# Patient Record
Sex: Female | Born: 1965
Health system: Southern US, Community
[De-identification: ages and names within clinical notes are randomized; demographics above are authoritative.]

## PROBLEM LIST (undated history)

## (undated) DIAGNOSIS — Z9889 Other specified postprocedural states: Secondary | ICD-10-CM

## (undated) DIAGNOSIS — M5136 Other intervertebral disc degeneration, lumbar region: Secondary | ICD-10-CM

## (undated) DIAGNOSIS — F419 Anxiety disorder, unspecified: Secondary | ICD-10-CM

## (undated) DIAGNOSIS — R112 Nausea with vomiting, unspecified: Secondary | ICD-10-CM

## (undated) DIAGNOSIS — Z8673 Personal history of transient ischemic attack (TIA), and cerebral infarction without residual deficits: Secondary | ICD-10-CM

## (undated) DIAGNOSIS — I639 Cerebral infarction, unspecified: Secondary | ICD-10-CM

## (undated) DIAGNOSIS — I499 Cardiac arrhythmia, unspecified: Secondary | ICD-10-CM

## (undated) DIAGNOSIS — E039 Hypothyroidism, unspecified: Secondary | ICD-10-CM

## (undated) DIAGNOSIS — E559 Vitamin D deficiency, unspecified: Secondary | ICD-10-CM

## (undated) DIAGNOSIS — M170 Bilateral primary osteoarthritis of knee: Secondary | ICD-10-CM

## (undated) DIAGNOSIS — M19072 Primary osteoarthritis, left ankle and foot: Secondary | ICD-10-CM

## (undated) DIAGNOSIS — M19071 Primary osteoarthritis, right ankle and foot: Secondary | ICD-10-CM

## (undated) DIAGNOSIS — Z8489 Family history of other specified conditions: Secondary | ICD-10-CM

## (undated) DIAGNOSIS — T505X5A Adverse effect of appetite depressants, initial encounter: Secondary | ICD-10-CM

## (undated) HISTORY — DX: Personal history of transient ischemic attack (TIA), and cerebral infarction without residual deficits: Z86.73

## (undated) HISTORY — PX: ABDOMINAL HYSTERECTOMY: SHX81

## (undated) HISTORY — PX: LASIK: SHX215

## (undated) HISTORY — DX: Primary osteoarthritis, right ankle and foot: M19.071

## (undated) HISTORY — PX: KNEE ARTHROSCOPY: SUR90

## (undated) HISTORY — DX: Other intervertebral disc degeneration, lumbar region: M51.36

## (undated) HISTORY — PX: CHOLECYSTECTOMY: SHX55

## (undated) HISTORY — DX: Bilateral primary osteoarthritis of knee: M17.0

## (undated) HISTORY — DX: Primary osteoarthritis, left ankle and foot: M19.072

## (undated) HISTORY — PX: EYE SURGERY: SHX253

---
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
## 2003-01-05 ENCOUNTER — Other Ambulatory Visit: Admission: RE | Admit: 2003-01-05 | Discharge: 2003-01-05 | Payer: Self-pay | Admitting: Obstetrics and Gynecology

## 2003-01-12 ENCOUNTER — Encounter: Payer: Self-pay | Admitting: Obstetrics and Gynecology

## 2003-01-12 ENCOUNTER — Ambulatory Visit (HOSPITAL_COMMUNITY): Admission: RE | Admit: 2003-01-12 | Discharge: 2003-01-12 | Payer: Self-pay | Admitting: Obstetrics and Gynecology

## 2003-01-19 ENCOUNTER — Encounter: Admission: RE | Admit: 2003-01-19 | Discharge: 2003-01-19 | Payer: Self-pay | Admitting: Obstetrics and Gynecology

## 2003-06-03 ENCOUNTER — Encounter: Admission: RE | Admit: 2003-06-03 | Discharge: 2003-06-03 | Payer: Self-pay | Admitting: Obstetrics and Gynecology

## 2003-12-31 ENCOUNTER — Other Ambulatory Visit: Admission: RE | Admit: 2003-12-31 | Discharge: 2003-12-31 | Payer: Self-pay | Admitting: Obstetrics and Gynecology

## 2004-11-16 ENCOUNTER — Other Ambulatory Visit: Admission: RE | Admit: 2004-11-16 | Discharge: 2004-11-16 | Payer: Self-pay | Admitting: Obstetrics and Gynecology

## 2004-12-13 ENCOUNTER — Encounter: Admission: RE | Admit: 2004-12-13 | Discharge: 2004-12-13 | Payer: Self-pay | Admitting: Obstetrics and Gynecology

## 2004-12-16 ENCOUNTER — Encounter: Admission: RE | Admit: 2004-12-16 | Discharge: 2004-12-16 | Payer: Self-pay | Admitting: Obstetrics and Gynecology

## 2005-03-23 ENCOUNTER — Encounter: Admission: RE | Admit: 2005-03-23 | Discharge: 2005-03-23 | Payer: Self-pay | Admitting: Obstetrics and Gynecology

## 2005-12-19 ENCOUNTER — Other Ambulatory Visit: Admission: RE | Admit: 2005-12-19 | Discharge: 2005-12-19 | Payer: Self-pay | Admitting: Obstetrics and Gynecology

## 2005-12-25 ENCOUNTER — Encounter: Admission: RE | Admit: 2005-12-25 | Discharge: 2005-12-25 | Payer: Self-pay | Admitting: Obstetrics and Gynecology

## 2006-12-27 ENCOUNTER — Encounter: Admission: RE | Admit: 2006-12-27 | Discharge: 2006-12-27 | Payer: Self-pay | Admitting: Obstetrics and Gynecology

## 2007-09-25 ENCOUNTER — Encounter: Admission: RE | Admit: 2007-09-25 | Discharge: 2007-09-25 | Payer: Self-pay | Admitting: Obstetrics and Gynecology

## 2007-12-30 ENCOUNTER — Encounter: Admission: RE | Admit: 2007-12-30 | Discharge: 2007-12-30 | Payer: Self-pay | Admitting: Obstetrics and Gynecology

## 2008-01-07 ENCOUNTER — Encounter: Admission: RE | Admit: 2008-01-07 | Discharge: 2008-01-07 | Payer: Self-pay | Admitting: Obstetrics and Gynecology

## 2008-01-09 ENCOUNTER — Encounter: Admission: RE | Admit: 2008-01-09 | Discharge: 2008-01-09 | Payer: Self-pay | Admitting: Obstetrics and Gynecology

## 2008-01-09 ENCOUNTER — Encounter (INDEPENDENT_AMBULATORY_CARE_PROVIDER_SITE_OTHER): Payer: Self-pay | Admitting: Diagnostic Radiology

## 2008-12-30 ENCOUNTER — Encounter: Admission: RE | Admit: 2008-12-30 | Discharge: 2008-12-30 | Payer: Self-pay | Admitting: Obstetrics and Gynecology

## 2009-01-07 ENCOUNTER — Encounter: Admission: RE | Admit: 2009-01-07 | Discharge: 2009-01-07 | Payer: Self-pay | Admitting: Obstetrics and Gynecology

## 2009-08-29 IMAGING — MG MM DIAGNOSTIC LTD RIGHT
3 series · 3 of 3 positions shown · non-contrast
Comparison: Multiple prior mammograms, dating back to December 13, 2004

CLINICAL DATA: Possible mass right breast seen on recent screening
mammogram of 12/30/2007

DIGITAL DIAGNOSTIC  RIGHT  MAMMOGRAM  WITHOUT CAD AND RIGHT BREAST
ULTRASOUND:

[R MLO]
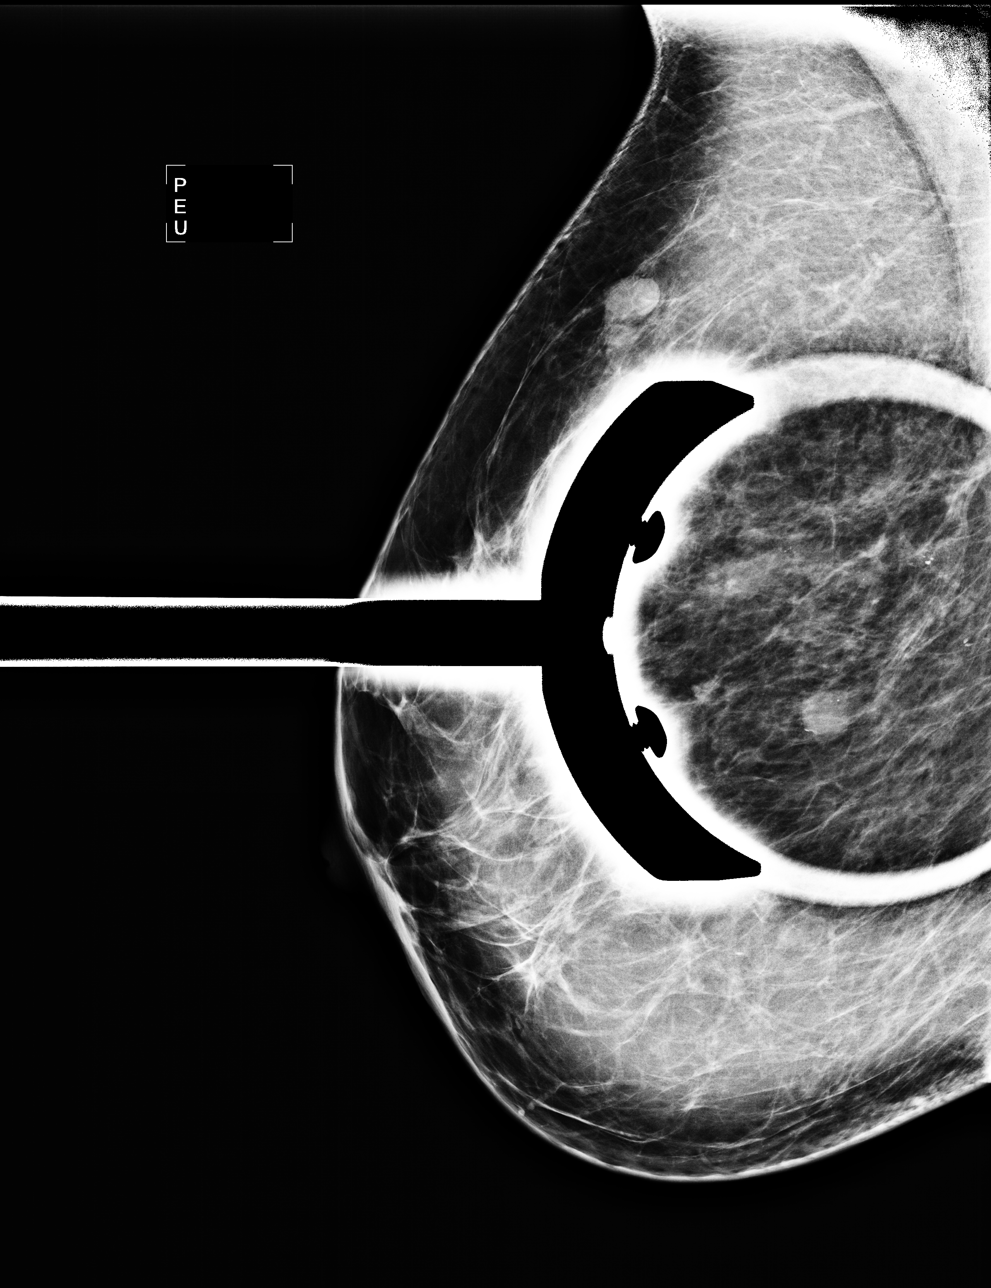

[R TAN]
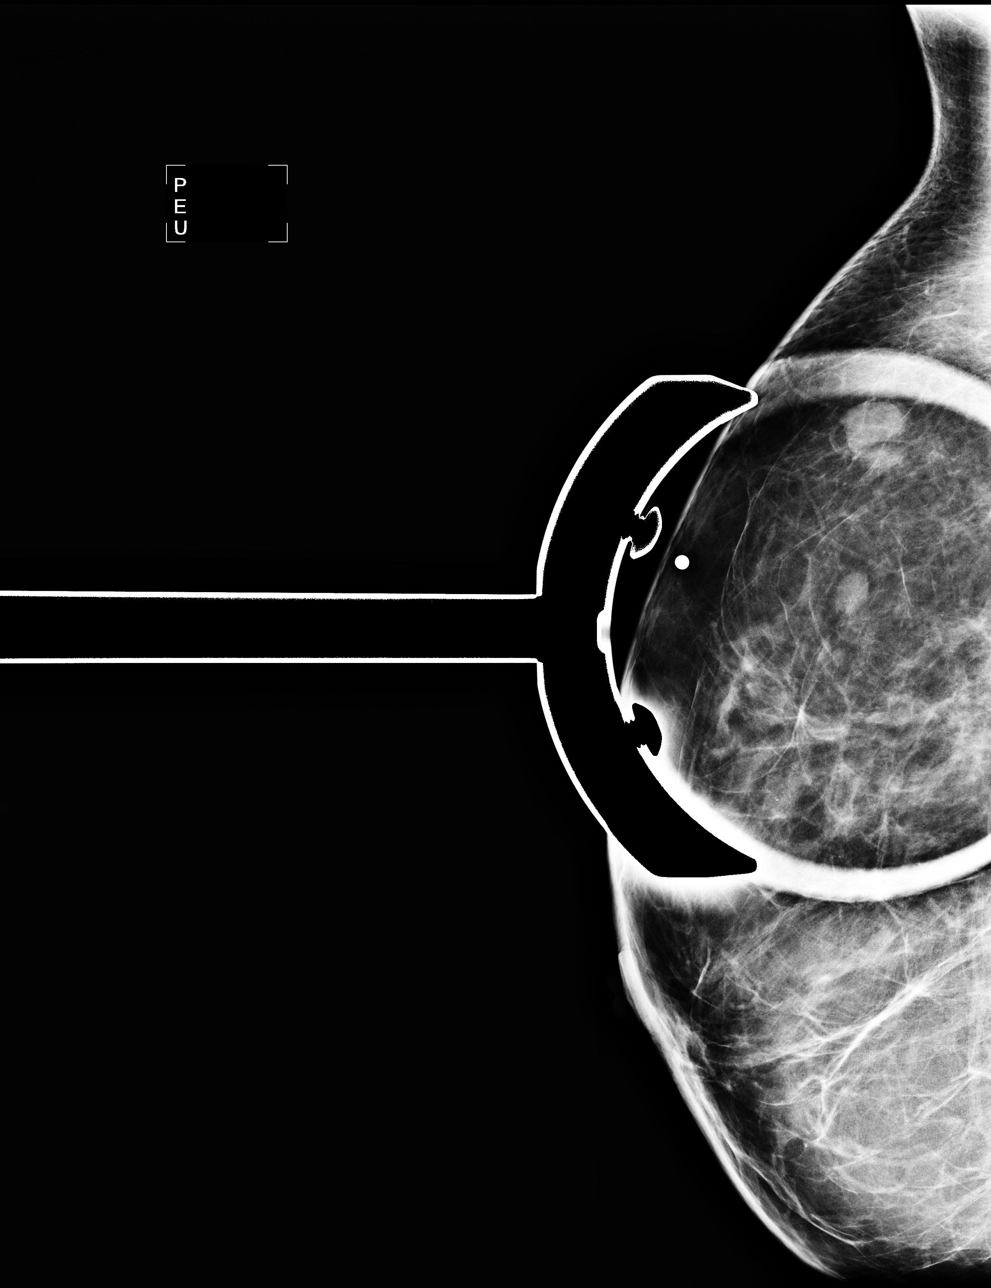

[R ML]
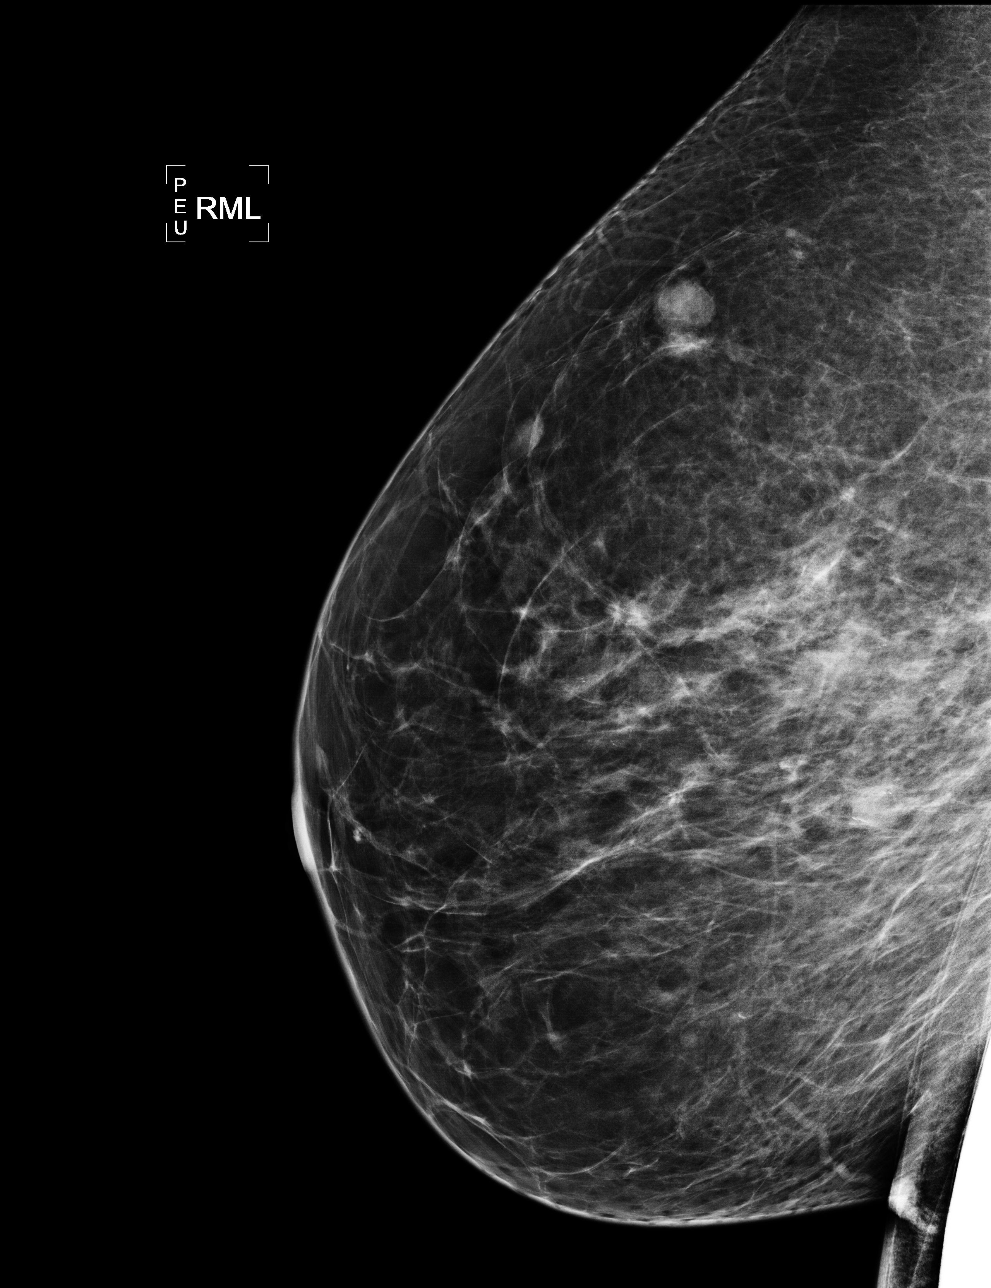

[3 of 3 positions shown; findings below may reference images not displayed]

FINDINGS: Spot compression view of the right breast, near the level
the nipple on the MLO view show an 8 mm circumscribed nodule.  This
may correspond to a nodule in the lateral right breast on the
recent screening mammogram.  A 90 degrees lateral view of the right
breast shows an 11 mm nodule in the far upper right breast that
appears stable dating back to 05/15/2004.

On physical exam, no mass is palpated in the upper outer right
breast.

Ultrasound is performed, showing a circumscribed 7 mm cyst with
internal echoes at 9 o'clock position 7 cm from the nipple.  At 9
o'clock position 5 cm from the nipple is an 8 mm simple cyst.

Ultrasound of the 12 o'clock right breast 5 to 6 cm from the nipple
shows a hypoechoic lobulated mass with some internal echogenic
bands that measures 1.1 by 1.1 x 0.6 cm.  At [DATE] position 3 cm
from the nipple is an oval simple cyst measuring 6 mm.

A spot tangential view of the right breast with a metallic marker
placed on the skin at the site of the solid nodule is performed.
Just superior to the metallic marker is a circumscribed 11 mm
nodule that is thought to be the mammographic correlate of the
solid nodule identified sonographically, and is stable dating back
to December 13, 2004.
IMPRESSION: 1.  Probable benign fibroadenoma 12 o'clock position right breast 5
cm from the nipple.  It is thought that the sonographically
identified solid nodule  at 12 o'clock position corresponds to a
mammographically identified nodule which is stable dating back to
6556, supporting a benign etiology.  To ensure stability of the
solid the lesion, a 6-month follow-up ultrasound is suggested.  The
option of ultrasound-guided core needle biopsy is also discussed
with the patient.  She would feel more comfortable having a biopsy.
Ultrasound-guided core needle biopsy has been scheduled for January 09, 2008 1 o'clock p.m.
2.  A total of three cysts are identified in the right breast.  One
of the cysts at the 9 o'clock region of the right breast is thought
to account for the mammographically identified nodule.

BI-RADS CATEGORY 3:  Probably benign finding(s) - short interval
follow-up suggested.

## 2009-08-31 IMAGING — US UNKNOWN PR STUDY
1 series · 8 of 8 positions shown · non-contrast
Comparison: none

Addendum Begins

Pathology reveals a fibroadenoma. This was found to be concordant
by Dr. Yoshitaro Alvizo. I spoke with the patient by telephone to relay
her pathology results. She stated that she had slight tenderness at
the biopsy site, but otherwise had done well after the biopsy. Post
biopsy instructions were given. The patient was told to call The
She was asked to return in 1 year for screening mammography.
Naty Dutra RN, BSN on January 13, 2008.
Addendum Ends
CLINICAL DATA: 1.1 cm hypoechoic solid mass 12 to 1 o'clock
position of the right breast with features suggestive of a probable
fibroadenoma.

[Series 2: unknown pr study · 8 of 8 slices shown]
[im 1/8]
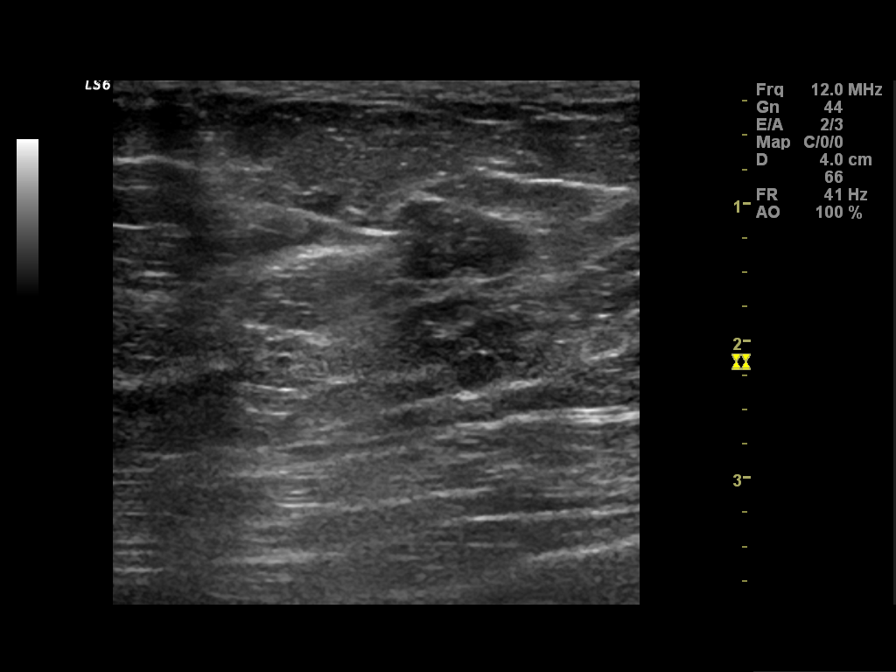
[im 2/8]
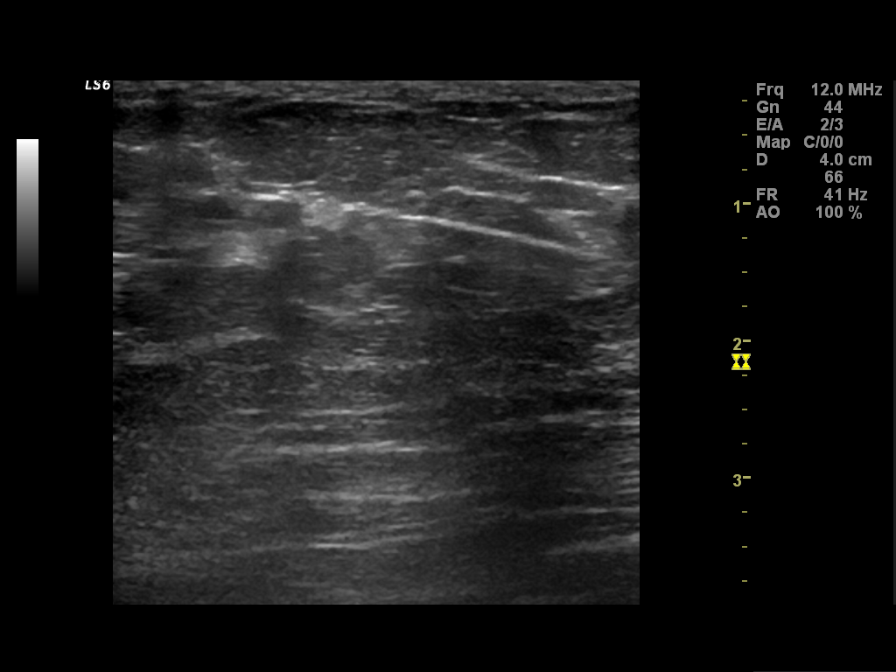
[im 3/8]
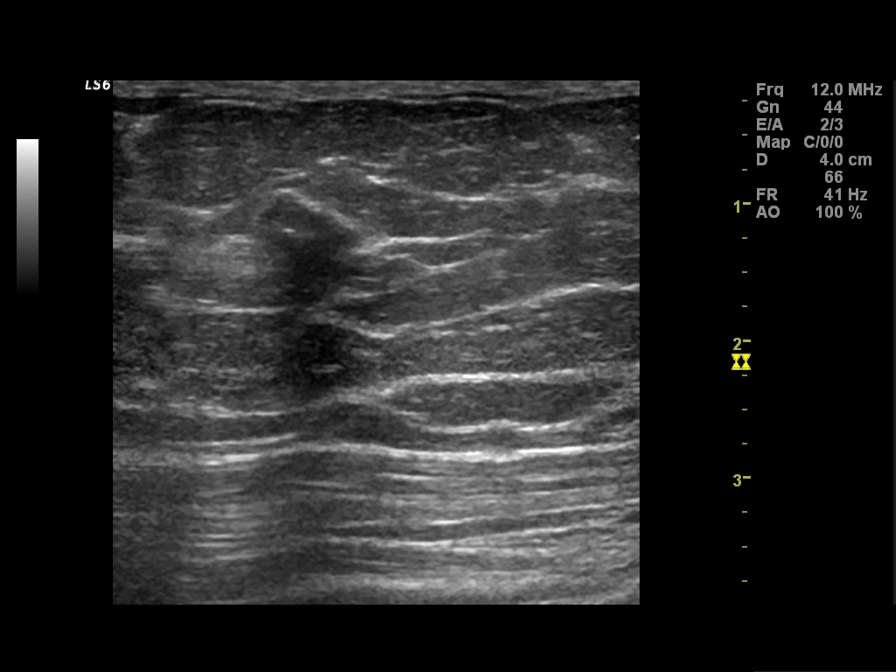
[im 4/8]
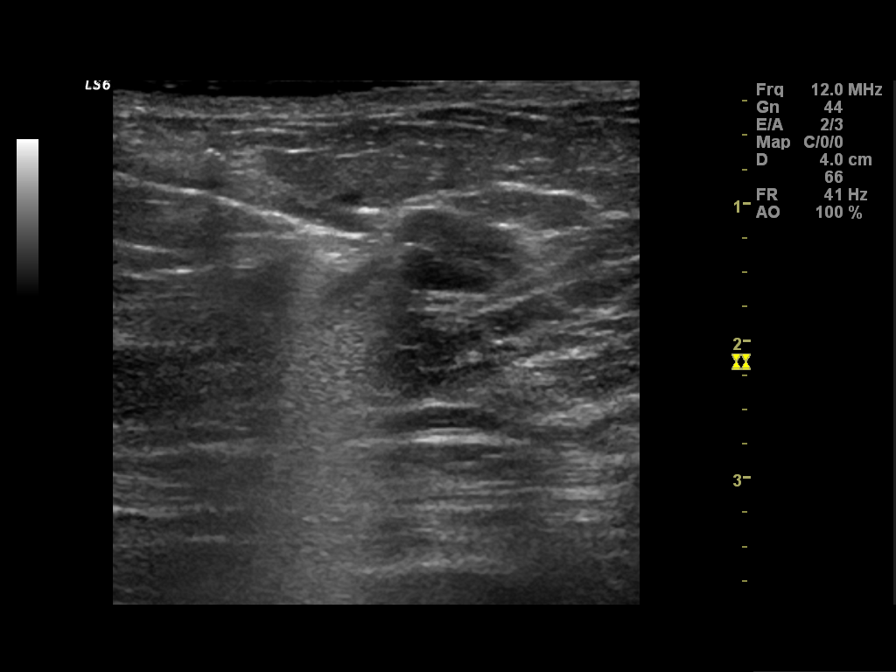
[im 5/8]
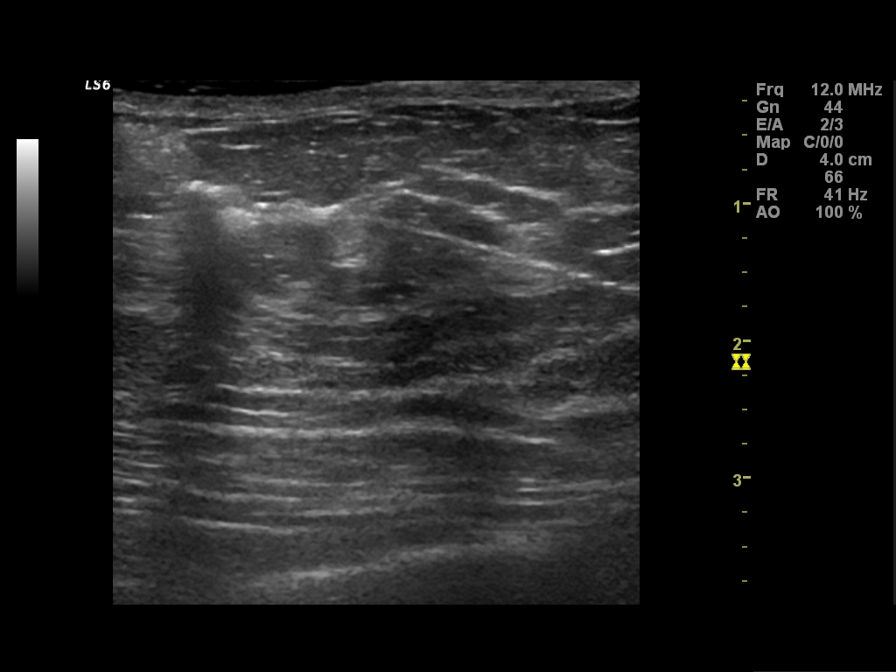
[im 6/8]
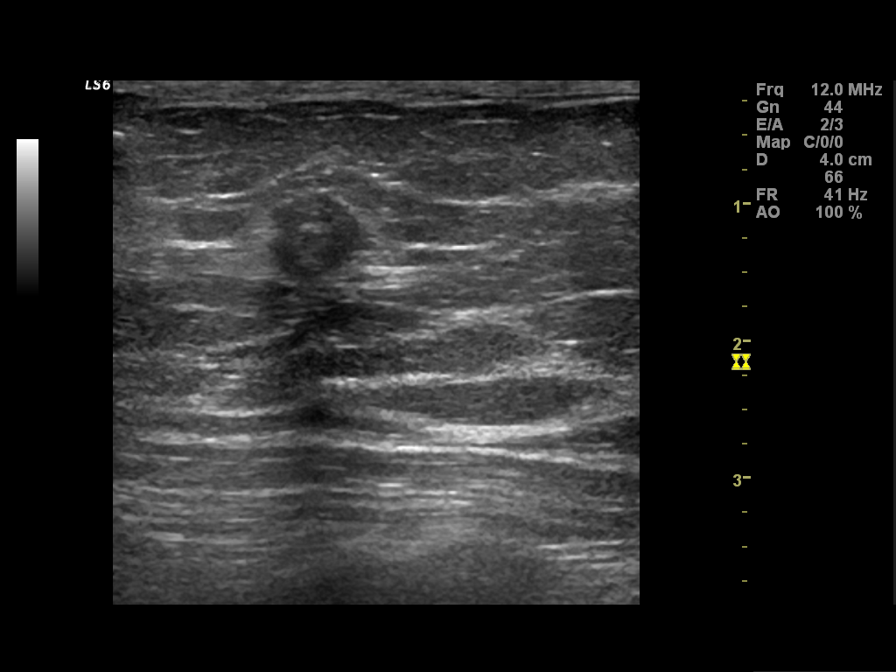
[im 7/8]
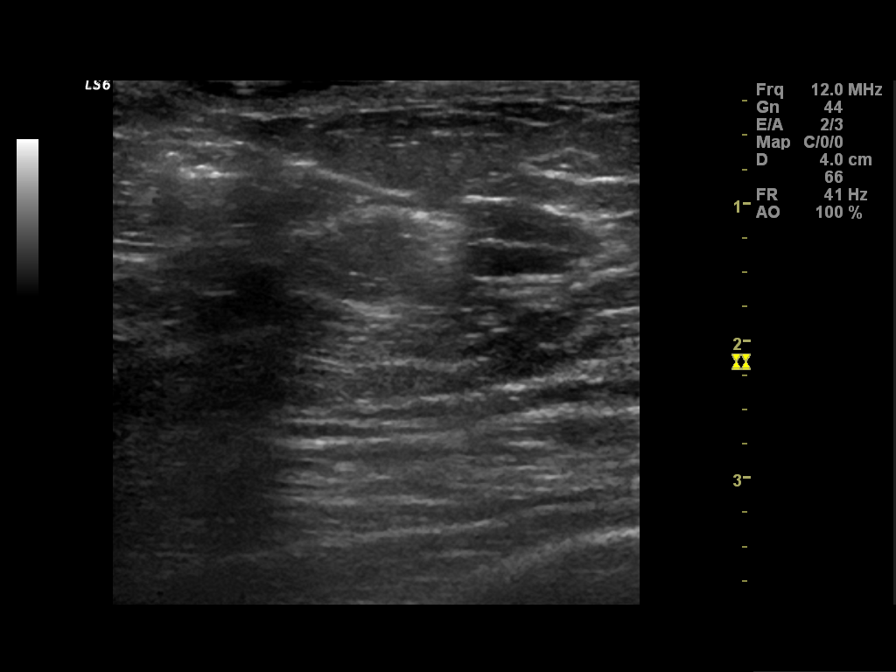
[im 8/8]
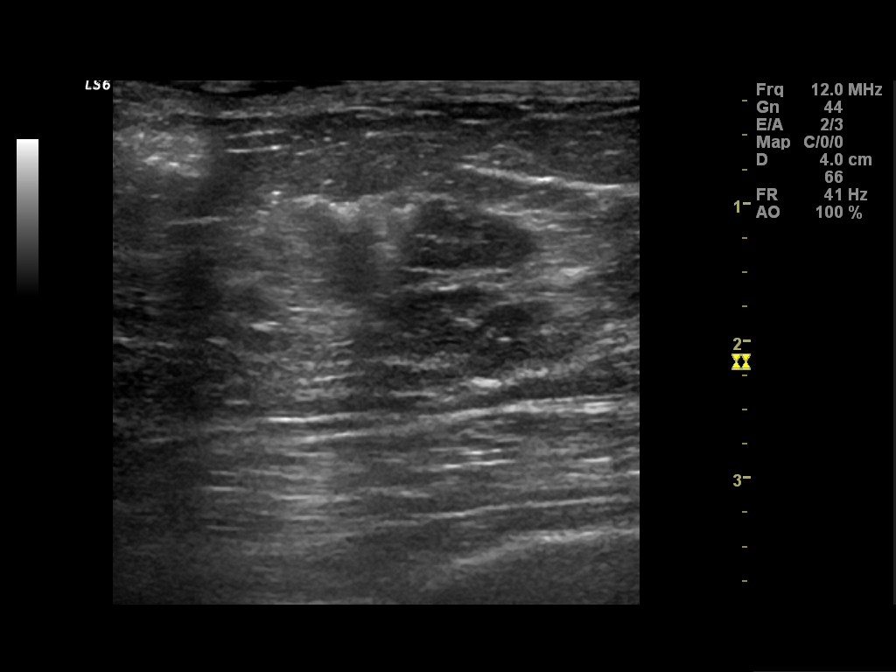

[8 of 8 positions shown; findings below may reference images not displayed]

ULTRASOUND GUIDED CORE BIOPSY OF THE RIGHT BREAST

I met with the patient, and we discussed the procedure of
ultrasound-guided biopsy, including risks, benefits, and
alternatives.  Specifically, we discussed the risks of infection,
bleeding, tissue injury, clip migration, and inadequate sampling.
Informed, written consent was given.

Using sterile technique,  2% lidocaine, ultrasound guidance, and a
14 gauge automated biopsy device, biopsy was performed of a 1.1 cm
solid nodule with features of a probable fibroadenoma.  At the
conclusion of the procedure, a tissue marker clip was deployed into
the biopsy cavity.  Follow-up 2-view mammogram confirmed clip to be
adequate, along the anterolateral margin of the nodule.
IMPRESSION: Ultrasound-guided biopsy of a 1.1 cm right breast mass.  No
apparent complications.

## 2010-01-10 ENCOUNTER — Encounter: Admission: RE | Admit: 2010-01-10 | Discharge: 2010-01-10 | Payer: Self-pay | Admitting: Obstetrics and Gynecology

## 2010-07-19 DIAGNOSIS — I639 Cerebral infarction, unspecified: Secondary | ICD-10-CM

## 2010-07-19 HISTORY — DX: Cerebral infarction, unspecified: I63.9

## 2010-08-03 ENCOUNTER — Emergency Department (HOSPITAL_COMMUNITY)
Admission: EM | Admit: 2010-08-03 | Discharge: 2010-08-03 | Disposition: A | Discharge status: Home / Self Care | Payer: 59 | Attending: Emergency Medicine | Admitting: Emergency Medicine

## 2010-08-03 ENCOUNTER — Emergency Department (HOSPITAL_COMMUNITY): Payer: 59

## 2010-08-03 DIAGNOSIS — F411 Generalized anxiety disorder: Secondary | ICD-10-CM | POA: Insufficient documentation

## 2010-08-03 DIAGNOSIS — R209 Unspecified disturbances of skin sensation: Secondary | ICD-10-CM | POA: Insufficient documentation

## 2010-08-03 DIAGNOSIS — M1990 Unspecified osteoarthritis, unspecified site: Secondary | ICD-10-CM | POA: Insufficient documentation

## 2010-08-03 LAB — COMPREHENSIVE METABOLIC PANEL
Albumin: 3 g/dL — ABNORMAL LOW (ref 3.5–5.2)
BUN: 8 mg/dL (ref 6–23)
CO2: 23 mEq/L (ref 19–32)
Chloride: 106 mEq/L (ref 96–112)
Creatinine, Ser: 0.57 mg/dL (ref 0.4–1.2)
GFR calc non Af Amer: >60 mL/min (ref >60)
Glucose, Bld: 102 mg/dL — ABNORMAL HIGH (ref 70–99)
Total Bilirubin: 0.8 mg/dL (ref 0.3–1.2)

## 2010-08-03 LAB — URINALYSIS, ROUTINE W REFLEX MICROSCOPIC
Bilirubin Urine: NEGATIVE
Glucose, UA: NEGATIVE mg/dL
Hgb urine dipstick: NEGATIVE
Ketones, ur: NEGATIVE mg/dL
pH: 6.5 (ref 5.0–8.0)

## 2010-08-03 LAB — RAPID URINE DRUG SCREEN, HOSP PERFORMED
Amphetamines: NOT DETECTED
Benzodiazepines: NOT DETECTED
Cocaine: NOT DETECTED
Opiates: NOT DETECTED
Tetrahydrocannabinol: NOT DETECTED

## 2010-08-03 LAB — CBC
MCH: 30 pg (ref 26.0–34.0)
MCV: 89 fL (ref 78.0–100.0)
Platelets: 253 10*3/uL (ref 150–400)
RBC: 4.2 MIL/uL (ref 3.87–5.11)

## 2010-08-03 LAB — RPR: RPR Ser Ql: NONREACTIVE

## 2010-08-03 LAB — ETHANOL: Alcohol, Ethyl (B): <11 mg/dL — ABNORMAL HIGH (ref 0–10)

## 2010-08-03 LAB — PROTIME-INR: Prothrombin Time: 13.5 seconds (ref 11.6–15.2)

## 2010-08-03 MED ORDER — GADOBENATE DIMEGLUMINE 529 MG/ML IV SOLN
17.0000 mL | Freq: Once | INTRAVENOUS | Status: AC
  Administered 2010-08-03: 17 mL via INTRAVENOUS

## 2010-08-04 LAB — ANA: Anti Nuclear Antibody(ANA): NEGATIVE

## 2010-08-04 LAB — C-REACTIVE PROTEIN: CRP: 1.7 mg/dL — ABNORMAL HIGH (ref <0.6)

## 2010-08-04 NOTE — Consult Note (Signed)
[name], [name]                ACCOUNT NO.:  [id number]  MEDICAL RECORD NO.:  [id number]           PATIENT TYPE:  E  LOCATION:  [facility]                         FACILITY:  [facility]  PHYSICIAN:  Levie Heritage, MD       DATE OF BIRTH:  1965/08/15  DATE OF CONSULTATION:  08/03/2010 DATE OF DISCHARGE:                                CONSULTATION   REFERRING PHYSICIAN:  Emergency Department Team in [facility]U.  REASON FOR CONSULTATION:  Left face and tongue paresthesias.  HISTORY OF PRESENT ILLNESS:  This patient is a 45 year old woman with no significant past medical history who woke up this morning and gradually started notice feeling "weird feeling" in her left face, left side of the tongue and questionable heaviness feeling in her left arm.  She was brought to the emergency department and a CT scan and an MRI of the brain was performed, which did not show any acute intracranial abnormality.  On detailed questioning to the patient, she admits to have recent stressors going on in her life mainly her son leaving for the [employer] today and also she has some old family member to take care of at home which is adding to her stressors.  PAST MEDICAL HISTORY:  Osteoarthritis.  The patient has been tested by rheumatologist for Schirmer test for the possible Sjogren in the past and has been negative.  She does have red eyes once in a while due to nonspecific inflammation as per Ophthalmology.  ALLERGIES:  No known drug allergies.  MEDICATIONS:  She takes Omega-3 oil, Ambien once or twice a week, and glucosamine.  FAMILY HISTORY:  No family history of vasculopathy or strokes in early ages.  SOCIAL HISTORY:  Lives with her family at home.  No smoking, no alcohol, or illicit drug abuse.  REVIEW OF SYSTEMS:  She does have off and on left eye redness because of nonspecific inflammation as per the Ophthalmology evaluation in the past.  She denies any chest pain.  Denies any shortness of  breath. Denies any recent weight loss or weight gain.  Denies any rashes. Denies any fever or any weakness of the limbs.  Denies any sensory issues any more since it resolved.  Denies any nausea, vomiting, or diarrhea.  Denies any burning urination.  Ten-organ review of system unremarkable except those mentioned above.  It is important to mention that the patient has been anxious these days because of her son leaving for the [employer] and old family member moving in with her and she has to take care of her as well now.  REVIEW OF CLINICAL DATA:  I have reviewed the patient's lab workup here as well as brain imaging and found unremarkable MRI of the brain and CAT scan of the head as well.  PHYSICAL EXAMINATION:  VITAL SIGNS:  Blood pressure 141/70 mmHg, pulse 59 per minute, breathing 18 per minute. CARDIOVASCULAR:  S1 plus S2 plus zero. ABDOMEN:  Soft, nontender without any organomegaly. LUNGS:  Breathing clear to auscultation bilaterally at least interiorly. NEUROLOGIC:  Bilateral reactive pupils to light and accommodation. Moves eyes to all direction.  Visual  fields are intact in all the quadrants.  Extraocular muscle intact in all the gazes.  Symmetrical face for sensation and strength testing with midline tongue without atrophy or fasciculation and good strength testing of the tongue on both sides.  Midline symmetrical palate elevation and shoulder shrug intact strength.  Motor examination is 5/5 all over with 2+ deep tendon reflexes symmetrically all over.  The gait is casual and normal. Sensory examination, she admits to have recovered her numbness that started this morning, but states that she still feels altered exactly in the midline of her face with altered sensations being on the left side of the full face.  There is no other sensory abnormalities either.  IMPRESSION:  This is a 45 year old woman with very nonspecific left face numbness, feeling heaviness on the left arm  and questionable altered sensation of the left tongue as well, which for the most part has resolved back to baseline with an MRI of the brain that is negative as well.   My impression is that her symptoms are very nonspecific for any given CNS pathology.  However, there are multiple possibilities, which can cause such symptoms.  The workup will be directed towards the wide variety of possibilities; however, I do not think we need to do the lumbar puncture at this point for any possibility of demyelination since there is no clinical findings suggestive of that.  PLAN:  I have advised the ER Team to send her following labs.  HbA1c, ESR, CRP, ANA antibodies, B12 and folate with RPR.  Clearly for the prophylactic reason, we will start the patient on 81 mg of aspirin on daily basis, although her symptoms were not localizable to any specific vascular territory either.  I have discussed the patient in detail about my impression and she will follow up with the [facility] in 3-4 weeks for the above lab results.  Otherwise, there is no additional neurological intervention recommended at this time as inpatient.          ______________________________ Levie Heritage, MD   WS/MEDQ  D:  08/03/2010  T:  08/04/2010  Job:  [id number]  Electronically Signed by Levie Heritage MD on 08/04/2010 10:04:11 AM

## 2010-08-30 IMAGING — US US BREAST L
1 series · 3 of 3 positions shown · non-contrast
Comparison: January 09, 2008 and March 23, 2005

CLINICAL DATA: Abnormal screening mammogram

DIGITAL DIAGNOSTIC  LEFT  MAMMOGRAM   AND LEFT BREAST ULTRASOUND:

[Series 1: us breast left · 3 of 3 slices shown]
[im 1/3]
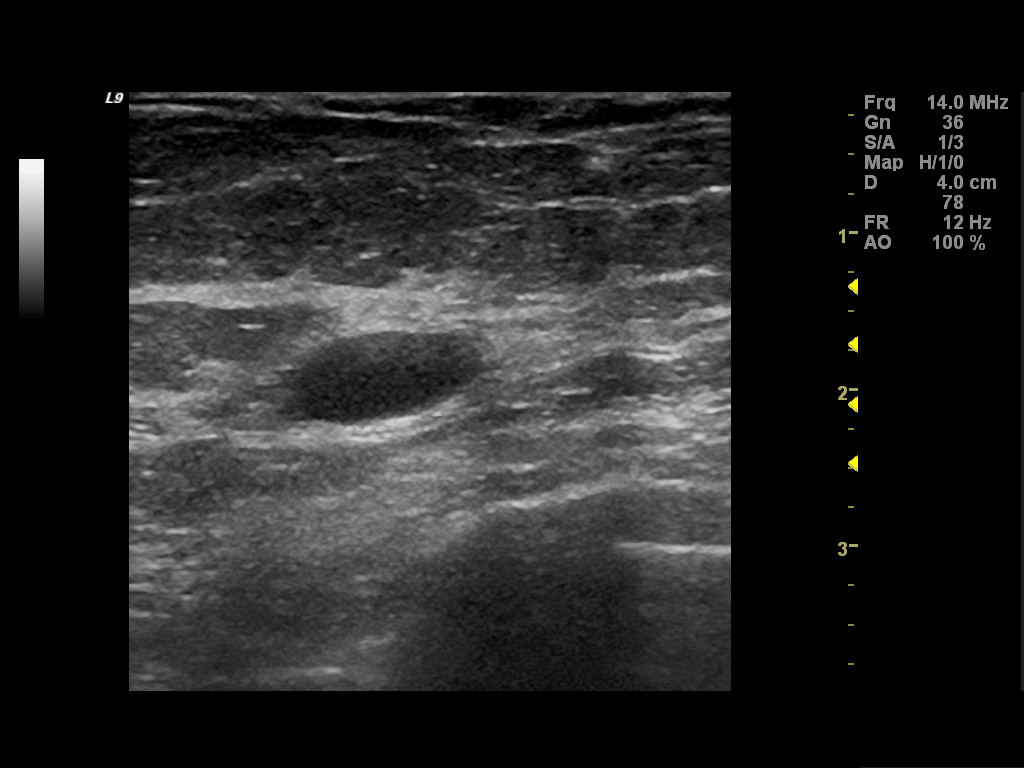
[im 2/3]
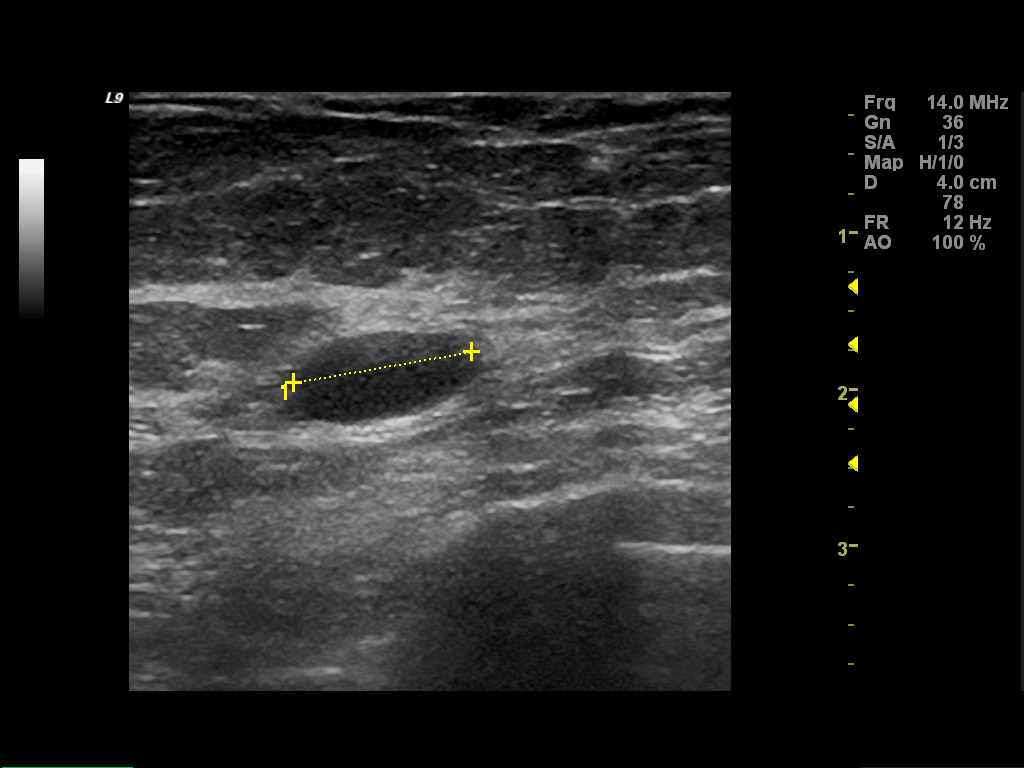
[im 3/3]
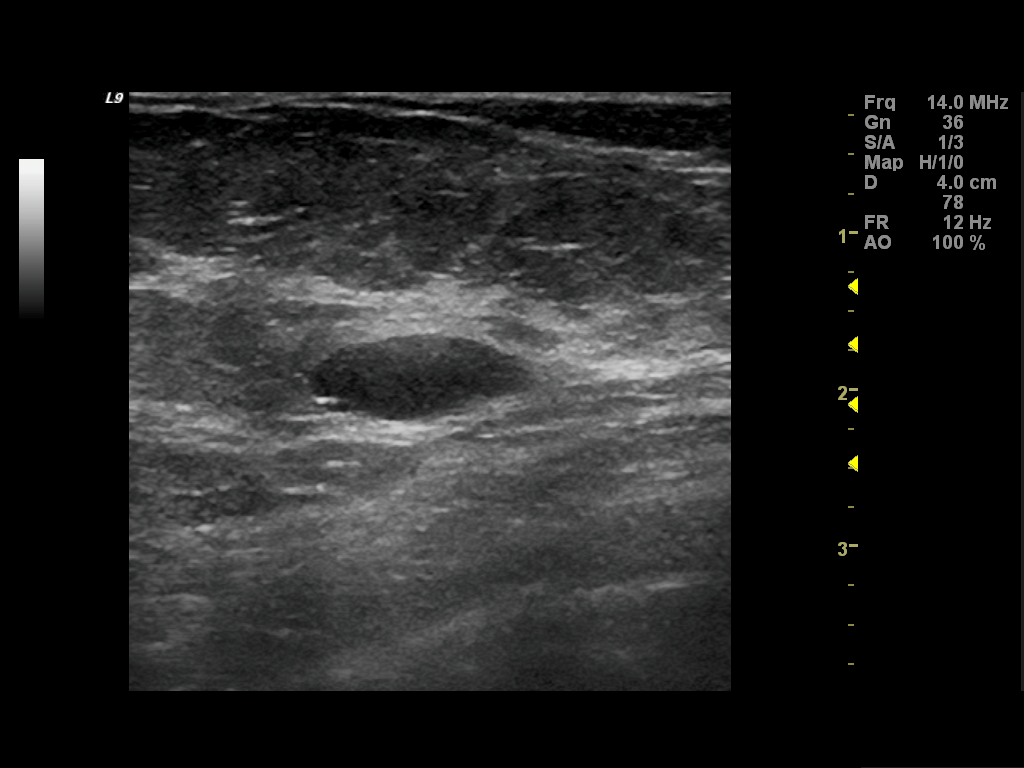

[3 of 3 positions shown; findings below may reference images not displayed]

FINDINGS: Additional views reveal persistence of multiple partially
obscured isodense masses in the left upper outer quadrant.  The
superior-most mass is new compared with the prior studies.  The
others have been proven to represent cysts with ultrasound in the
past.

On physical exam, I cannot palpate a discrete lump within the left
upper outer quadrant today.

Ultrasound is performed, showing multiple simple cysts at the 2
o'clock position of the left breast.  No suspicious solid masses or
shadowing are identified.
IMPRESSION: Simple cysts in the left upper outer quadrant.  There is no
radiographic evidence of malignancy on the left.  Screening
mammogram in 1 year recommended.

BI-RADS CATEGORY 2:  Benign finding(s).

## 2010-11-18 ENCOUNTER — Other Ambulatory Visit: Payer: Self-pay | Admitting: Obstetrics and Gynecology

## 2010-11-18 DIAGNOSIS — Z1231 Encounter for screening mammogram for malignant neoplasm of breast: Secondary | ICD-10-CM

## 2011-01-12 ENCOUNTER — Ambulatory Visit
Admission: RE | Admit: 2011-01-12 | Discharge: 2011-01-12 | Disposition: A | Discharge status: Home / Self Care | Payer: 59 | Source: Ambulatory Visit | Attending: Obstetrics and Gynecology | Admitting: Obstetrics and Gynecology

## 2011-01-12 DIAGNOSIS — Z1231 Encounter for screening mammogram for malignant neoplasm of breast: Secondary | ICD-10-CM

## 2011-03-21 HISTORY — PX: ABDOMINAL HYSTERECTOMY: SHX81

## 2011-09-20 NOTE — H&P (Signed)
[name]  DICTATION # [id number] CSN# [id number]   Meriel Pica, MD 09/20/2011 10:34 AM

## 2011-09-22 NOTE — H&P (Signed)
[name], [name]                ACCOUNT NO.:  [id number]  MEDICAL RECORD NO.:  [id number]  LOCATION:                                 FACILITY:  PHYSICIAN:  Duke Salvia. Marcelle Overlie, M.D.DATE OF BIRTH:  1965-08-03  DATE OF ADMISSION:  10/11/2011 DATE OF DISCHARGE:                             HISTORY & PHYSICAL   CHIEF COMPLAINT:  Pelvic pain, leiomyoma.  HISTORY OF PRESENT ILLNESS:  A 46 year old, G1, P1, who until recently had a Mirena IUD for contraception, one prior vaginal delivery.  The patient has been followed by Julio Sicks recently, history of prior mini-stroke and was told to discontinue her Levlite oral contraceptives after that episode made in 2012 when she was using the NuvaRing.  She has done well with the IUD, but continued to develop problems related to worsening pelvic pain.  Recent evaluation in our office; ultrasound on August 22, 2011, showed calcified intramural fibroid 3.5 cm with a small left ovarian cyst 2.6 x 1.7.  When seen by our PA in the office on April 2013, CA-125 was drawn, that was 12.4.  She presents now for definitive LAVH, possible USO or BSO, if pathology is noted.  This procedure including specific risks related to bleeding, infection, transfusion, the possible need for open surgery, and other complication related to wound infection, phlebitis along with her expected recovery time discussed, which she understands and accepts.  The IUD was removed 2 weeks preoperatively.  PAST MEDICAL HISTORY:  ALLERGIES:  CELEBREX.  PRIOR SURGERY:  One vaginal delivery, cholecystectomy.  CURRENT MEDICATIONS:  Vitamin supplements only, Motrin p.r.n.  REVIEW OF SYSTEMS:  Significant for history of migraine headache, arthritis, gallbladder disease, and prior history of mini-stroke without sequelae.  FAMILY HISTORY:  Significant for migraine, arthritis, diabetes, and breast cancer.  SOCIAL HISTORY:  Denies tobacco or drug use.  She does have 1-2 alcoholic  drinks per week.  She is married.  Dr. Anna Genre of [facility] is her medical doctor.  Last Pap on 03/2011 was normal.  PHYSICAL EXAMINATION:  VITAL SIGNS:  Temp 98.2, blood pressure 120/78. HEENT:  Unremarkable. NECK:  Supple without masses. LUNGS:  Clear. CARDIOVASCULAR:  Regular rate and rhythm without murmurs, rubs, or gallops. BREASTS:  Without masses. ABDOMEN:  Soft, flat, nontender. PELVIC:  Vulva, vagina, cervix normal.  Uterus was normal size with fibroid irregularity to the right posterior.  Adnexal negative. EXTREMITIES:  Unremarkable. NEUROLOGIC:  Unremarkable.  IMPRESSION:  Symptomatic calcified leiomyoma, pelvic pain.  PLAN:  LAVH, possible BSO.  Procedure and risks discussed as above.     [name] M. Marcelle Overlie, M.D.     [facility]/MEDQ  D:  09/20/2011  T:  09/20/2011  Job:  [id number]

## 2011-10-02 ENCOUNTER — Encounter (HOSPITAL_COMMUNITY): Payer: Self-pay | Admitting: Pharmacist

## 2011-10-04 ENCOUNTER — Encounter (HOSPITAL_COMMUNITY): Payer: Self-pay

## 2011-10-04 ENCOUNTER — Encounter (HOSPITAL_COMMUNITY)
Admission: RE | Admit: 2011-10-04 | Discharge: 2011-10-04 | Disposition: A | Discharge status: Home / Self Care | Payer: 59 | Source: Ambulatory Visit | Attending: Obstetrics and Gynecology | Admitting: Obstetrics and Gynecology

## 2011-10-04 HISTORY — DX: Cerebral infarction, unspecified: I63.9

## 2011-10-04 LAB — SURGICAL PCR SCREEN
MRSA, PCR: INVALID — AB
Staphylococcus aureus: INVALID — AB

## 2011-10-04 LAB — CBC
MCH: 30.1 pg (ref 26.0–34.0)
Platelets: 277 10*3/uL (ref 150–400)
RBC: 4.29 MIL/uL (ref 3.87–5.11)
WBC: 5.8 10*3/uL (ref 4.0–10.5)

## 2011-10-04 NOTE — Patient Instructions (Addendum)
20 PAYTYN MESTA  10/04/2011   Your procedure is scheduled on:  10/11/11  Enter through the Main Entrance of [facility] at 6 AM.  Pick up the phone at the desk and dial [phone].   Call this number if you have problems the morning of surgery: [phone]   Remember:   Do not eat food:After Midnight.  Do not drink clear liquids: After Midnight.  Take these medicines the morning of surgery with A SIP OF WATER: NA   Do not wear jewelry, make-up or nail polish.  Do not wear lotions, powders, or perfumes. You may wear deodorant.  Do not shave 48 hours prior to surgery.  Do not bring valuables to the hospital.  Contacts, dentures or bridgework may not be worn into surgery.  Leave suitcase in the car. After surgery it may be brought to your room.  For patients admitted to the hospital, checkout time is 11:00 AM the day of discharge.   Patients discharged the day of surgery will not be allowed to drive home.  Name and phone number of your driver: NA  Special Instructions: CHG Shower Use Special Wash: 1/2 bottle night before surgery and 1/2 bottle morning of surgery.   Please read over the following fact sheets that you were given: MRSA Information

## 2011-10-04 NOTE — Pre-Procedure Instructions (Signed)
History of TIA of 2012 reviewed with Dr Rodman Pickle. No orders given.

## 2011-10-06 LAB — MRSA CULTURE

## 2011-10-11 ENCOUNTER — Ambulatory Visit (HOSPITAL_COMMUNITY)
Admission: RE | Admit: 2011-10-11 | Discharge: 2011-10-12 | Disposition: A | Discharge status: Home / Self Care | Payer: 59 | Source: Ambulatory Visit | Attending: Obstetrics and Gynecology | Admitting: Obstetrics and Gynecology

## 2011-10-11 ENCOUNTER — Encounter (HOSPITAL_COMMUNITY): Payer: Self-pay | Admitting: Anesthesiology

## 2011-10-11 ENCOUNTER — Ambulatory Visit (HOSPITAL_COMMUNITY): Payer: 59 | Admitting: Anesthesiology

## 2011-10-11 ENCOUNTER — Encounter (HOSPITAL_COMMUNITY): Payer: Self-pay | Admitting: *Deleted

## 2011-10-11 ENCOUNTER — Encounter (HOSPITAL_COMMUNITY)
Admission: RE | Disposition: A | Discharge status: Home / Self Care | Payer: Self-pay | Source: Ambulatory Visit | Attending: Obstetrics and Gynecology

## 2011-10-11 DIAGNOSIS — Z01812 Encounter for preprocedural laboratory examination: Secondary | ICD-10-CM | POA: Insufficient documentation

## 2011-10-11 DIAGNOSIS — N8000 Endometriosis of the uterus, unspecified: Secondary | ICD-10-CM | POA: Insufficient documentation

## 2011-10-11 DIAGNOSIS — D252 Subserosal leiomyoma of uterus: Secondary | ICD-10-CM | POA: Insufficient documentation

## 2011-10-11 DIAGNOSIS — R102 Pelvic and perineal pain: Secondary | ICD-10-CM

## 2011-10-11 DIAGNOSIS — Z01818 Encounter for other preprocedural examination: Secondary | ICD-10-CM | POA: Insufficient documentation

## 2011-10-11 DIAGNOSIS — D251 Intramural leiomyoma of uterus: Secondary | ICD-10-CM | POA: Insufficient documentation

## 2011-10-11 DIAGNOSIS — N949 Unspecified condition associated with female genital organs and menstrual cycle: Secondary | ICD-10-CM | POA: Insufficient documentation

## 2011-10-11 DIAGNOSIS — D219 Benign neoplasm of connective and other soft tissue, unspecified: Secondary | ICD-10-CM

## 2011-10-11 HISTORY — PX: LAPAROSCOPIC ASSISTED VAGINAL HYSTERECTOMY: SHX5398

## 2011-10-11 LAB — PREGNANCY, URINE: Preg Test, Ur: NEGATIVE

## 2011-10-11 SURGERY — HYSTERECTOMY, VAGINAL, LAPAROSCOPY-ASSISTED
Anesthesia: General | Site: Abdomen | Wound class: Clean Contaminated

## 2011-10-11 MED ORDER — BUPIVACAINE HCL (PF) 0.25 % IJ SOLN
INTRAMUSCULAR | Status: DC | PRN
  Administered 2011-10-11: 7 mL

## 2011-10-11 MED ORDER — LACTATED RINGERS IV SOLN
INTRAVENOUS | Status: DC
  Administered 2011-10-11 (×2): via INTRAVENOUS

## 2011-10-11 MED ORDER — ZOLPIDEM TARTRATE 5 MG PO TABS: 5.0000 mg | ORAL_TABLET | Freq: Every evening | ORAL | Status: DC | PRN

## 2011-10-11 MED ORDER — SCOPOLAMINE 1 MG/3DAYS TD PT72
1.0000 | MEDICATED_PATCH | TRANSDERMAL | Status: DC
  Administered 2011-10-11: 1.5 mg via TRANSDERMAL

## 2011-10-11 MED ORDER — SODIUM CHLORIDE 0.9 % IJ SOLN: 9.0000 mL | INTRAMUSCULAR | Status: DC | PRN

## 2011-10-11 MED ORDER — GLYCOPYRROLATE 0.2 MG/ML IJ SOLN
INTRAMUSCULAR | Status: DC | PRN
  Administered 2011-10-11: 0.6 mg via INTRAVENOUS
  Administered 2011-10-11: 0.1 mg via INTRAVENOUS

## 2011-10-11 MED ORDER — LIDOCAINE HCL (CARDIAC) 20 MG/ML IV SOLN
INTRAVENOUS | Status: AC
  Filled 2011-10-11: qty 5

## 2011-10-11 MED ORDER — ONDANSETRON HCL 4 MG/2ML IJ SOLN
INTRAMUSCULAR | Status: AC
  Filled 2011-10-11: qty 2

## 2011-10-11 MED ORDER — NEOSTIGMINE METHYLSULFATE 1 MG/ML IJ SOLN
INTRAMUSCULAR | Status: DC | PRN
  Administered 2011-10-11: 3 mg via INTRAVENOUS

## 2011-10-11 MED ORDER — DIPHENHYDRAMINE HCL 50 MG/ML IJ SOLN: 12.5000 mg | Freq: Four times a day (QID) | INTRAMUSCULAR | Status: DC | PRN

## 2011-10-11 MED ORDER — SCOPOLAMINE 1 MG/3DAYS TD PT72
MEDICATED_PATCH | TRANSDERMAL | Status: AC
  Filled 2011-10-11: qty 1

## 2011-10-11 MED ORDER — ROCURONIUM BROMIDE 100 MG/10ML IV SOLN
INTRAVENOUS | Status: DC | PRN
  Administered 2011-10-11: 50 mg via INTRAVENOUS

## 2011-10-11 MED ORDER — SODIUM CHLORIDE 0.9 % IJ SOLN
INTRAMUSCULAR | Status: DC | PRN
  Administered 2011-10-11: 7 mL

## 2011-10-11 MED ORDER — FENTANYL CITRATE 0.05 MG/ML IJ SOLN
INTRAMUSCULAR | Status: DC | PRN
  Administered 2011-10-11: 100 ug via INTRAVENOUS
  Administered 2011-10-11: 50 ug via INTRAVENOUS
  Administered 2011-10-11: 100 ug via INTRAVENOUS

## 2011-10-11 MED ORDER — FENTANYL CITRATE 0.05 MG/ML IJ SOLN
25.0000 ug | INTRAMUSCULAR | Status: DC | PRN
  Administered 2011-10-11 (×2): 50 ug via INTRAVENOUS

## 2011-10-11 MED ORDER — ROCURONIUM BROMIDE 50 MG/5ML IV SOLN
INTRAVENOUS | Status: AC
  Filled 2011-10-11: qty 1

## 2011-10-11 MED ORDER — MEPERIDINE HCL 25 MG/ML IJ SOLN: 6.2500 mg | INTRAMUSCULAR | Status: DC | PRN

## 2011-10-11 MED ORDER — CEFAZOLIN SODIUM-DEXTROSE 2-3 GM-% IV SOLR
INTRAVENOUS | Status: AC
  Filled 2011-10-11: qty 50

## 2011-10-11 MED ORDER — MIDAZOLAM HCL 5 MG/5ML IJ SOLN: INTRAMUSCULAR | Status: DC | PRN

## 2011-10-11 MED ORDER — MORPHINE SULFATE (PF) 1 MG/ML IV SOLN
INTRAVENOUS | Status: DC
  Administered 2011-10-11: 11:00:00 via INTRAVENOUS
  Administered 2011-10-11: 4 mg via INTRAVENOUS
  Administered 2011-10-11: 3 mg via INTRAVENOUS
  Administered 2011-10-12 (×2): 1 mg via INTRAVENOUS
  Administered 2011-10-12: 2 mg via INTRAVENOUS
  Filled 2011-10-11: qty 25

## 2011-10-11 MED ORDER — PROPOFOL 10 MG/ML IV EMUL
INTRAVENOUS | Status: DC | PRN
  Administered 2011-10-11: 200 mg via INTRAVENOUS

## 2011-10-11 MED ORDER — BUTORPHANOL TARTRATE 1 MG/ML IJ SOLN: 1.0000 mg | INTRAMUSCULAR | Status: DC | PRN

## 2011-10-11 MED ORDER — FENTANYL CITRATE 0.05 MG/ML IJ SOLN
INTRAMUSCULAR | Status: AC
  Administered 2011-10-11: 50 ug via INTRAVENOUS
  Filled 2011-10-11: qty 2

## 2011-10-11 MED ORDER — ONDANSETRON HCL 4 MG/2ML IJ SOLN
INTRAMUSCULAR | Status: DC | PRN
  Administered 2011-10-11: 4 mg via INTRAVENOUS

## 2011-10-11 MED ORDER — LIDOCAINE HCL (CARDIAC) 20 MG/ML IV SOLN
INTRAVENOUS | Status: DC | PRN
  Administered 2011-10-11: 40 mg via INTRAVENOUS

## 2011-10-11 MED ORDER — IBUPROFEN 800 MG PO TABS: 800.0000 mg | ORAL_TABLET | Freq: Three times a day (TID) | ORAL | Status: DC | PRN

## 2011-10-11 MED ORDER — PROPOFOL 10 MG/ML IV EMUL
INTRAVENOUS | Status: AC
  Filled 2011-10-11: qty 20

## 2011-10-11 MED ORDER — CEFAZOLIN SODIUM-DEXTROSE 2-3 GM-% IV SOLR
2.0000 g | INTRAVENOUS | Status: AC
  Administered 2011-10-11: 2 g via INTRAVENOUS

## 2011-10-11 MED ORDER — HYDROMORPHONE HCL PF 1 MG/ML IJ SOLN
INTRAMUSCULAR | Status: DC | PRN
  Administered 2011-10-11: 1 mg via INTRAVENOUS

## 2011-10-11 MED ORDER — HYDROMORPHONE HCL PF 1 MG/ML IJ SOLN
0.2500 mg | INTRAMUSCULAR | Status: DC | PRN
  Administered 2011-10-11 (×2): 0.5 mg via INTRAVENOUS

## 2011-10-11 MED ORDER — GLYCOPYRROLATE 0.2 MG/ML IJ SOLN
INTRAMUSCULAR | Status: AC
  Filled 2011-10-11: qty 2

## 2011-10-11 MED ORDER — MIDAZOLAM HCL 2 MG/2ML IJ SOLN
INTRAMUSCULAR | Status: AC
  Filled 2011-10-11: qty 2

## 2011-10-11 MED ORDER — FENTANYL CITRATE 0.05 MG/ML IJ SOLN
INTRAMUSCULAR | Status: AC
  Filled 2011-10-11: qty 5

## 2011-10-11 MED ORDER — ONDANSETRON HCL 4 MG PO TABS: 4.0000 mg | ORAL_TABLET | Freq: Four times a day (QID) | ORAL | Status: DC | PRN

## 2011-10-11 MED ORDER — DEXAMETHASONE SODIUM PHOSPHATE 10 MG/ML IJ SOLN
INTRAMUSCULAR | Status: AC
  Filled 2011-10-11: qty 1

## 2011-10-11 MED ORDER — MIDAZOLAM HCL 5 MG/5ML IJ SOLN
INTRAMUSCULAR | Status: DC | PRN
  Administered 2011-10-11: 2 mg via INTRAVENOUS

## 2011-10-11 MED ORDER — MENTHOL 3 MG MT LOZG: 1.0000 | LOZENGE | OROMUCOSAL | Status: DC | PRN

## 2011-10-11 MED ORDER — ONDANSETRON HCL 4 MG/2ML IJ SOLN
4.0000 mg | Freq: Four times a day (QID) | INTRAMUSCULAR | Status: DC | PRN
  Administered 2011-10-11: 4 mg via INTRAVENOUS
  Filled 2011-10-11: qty 2

## 2011-10-11 MED ORDER — DIPHENHYDRAMINE HCL 12.5 MG/5ML PO ELIX: 12.5000 mg | ORAL_SOLUTION | Freq: Four times a day (QID) | ORAL | Status: DC | PRN

## 2011-10-11 MED ORDER — NALOXONE HCL 0.4 MG/ML IJ SOLN: 0.4000 mg | INTRAMUSCULAR | Status: DC | PRN

## 2011-10-11 MED ORDER — NEOSTIGMINE METHYLSULFATE 1 MG/ML IJ SOLN
INTRAMUSCULAR | Status: AC
  Filled 2011-10-11: qty 10

## 2011-10-11 MED ORDER — METOCLOPRAMIDE HCL 5 MG/ML IJ SOLN: 10.0000 mg | Freq: Once | INTRAMUSCULAR | Status: DC | PRN

## 2011-10-11 MED ORDER — BUPIVACAINE HCL (PF) 0.25 % IJ SOLN
INTRAMUSCULAR | Status: AC
  Filled 2011-10-11: qty 30

## 2011-10-11 MED ORDER — DEXAMETHASONE SODIUM PHOSPHATE 10 MG/ML IJ SOLN
INTRAMUSCULAR | Status: DC | PRN
  Administered 2011-10-11: 10 mg via INTRAVENOUS

## 2011-10-11 MED ORDER — HYDROMORPHONE HCL PF 1 MG/ML IJ SOLN
INTRAMUSCULAR | Status: AC
  Filled 2011-10-11: qty 1

## 2011-10-11 MED ORDER — OXYCODONE-ACETAMINOPHEN 5-325 MG PO TABS
1.0000 | ORAL_TABLET | ORAL | Status: DC | PRN
  Administered 2011-10-12: 2 via ORAL
  Filled 2011-10-11: qty 2

## 2011-10-11 MED ORDER — ONDANSETRON HCL 4 MG/2ML IJ SOLN: 4.0000 mg | Freq: Four times a day (QID) | INTRAMUSCULAR | Status: DC | PRN

## 2011-10-11 MED ORDER — HYDROMORPHONE HCL PF 1 MG/ML IJ SOLN
INTRAMUSCULAR | Status: AC
  Administered 2011-10-11: 0.5 mg via INTRAVENOUS
  Filled 2011-10-11: qty 1

## 2011-10-11 SURGICAL SUPPLY — 45 items
ADH SKN CLS APL DERMABOND .7 (GAUZE/BANDAGES/DRESSINGS) ×2
CABLE HIGH FREQUENCY MONO STRZ (ELECTRODE) IMPLANT
CATH ROBINSON RED A/P 16FR (CATHETERS) ×2 IMPLANT
CLOTH BEACON ORANGE TIMEOUT ST (SAFETY) ×3 IMPLANT
CONT PATH 16OZ SNAP LID 3702 (MISCELLANEOUS) ×3 IMPLANT
COVER TABLE BACK 60X90 (DRAPES) ×3 IMPLANT
DECANTER SPIKE VIAL GLASS SM (MISCELLANEOUS) ×2 IMPLANT
DERMABOND ADVANCED (GAUZE/BANDAGES/DRESSINGS) ×1
DERMABOND ADVANCED .7 DNX12 (GAUZE/BANDAGES/DRESSINGS) ×3 IMPLANT
DRSG COVADERM PLUS 2X2 (GAUZE/BANDAGES/DRESSINGS) ×2 IMPLANT
ELECT LIGASURE LONG (ELECTRODE) ×3 IMPLANT
ELECT REM PT RETURN 9FT ADLT (ELECTROSURGICAL) ×3
ELECTRODE REM PT RTRN 9FT ADLT (ELECTROSURGICAL) ×2 IMPLANT
GLOVE BIO SURGEON STRL SZ7 (GLOVE) ×9 IMPLANT
GLOVE BIO SURGEON STRL SZ8 (GLOVE) ×2 IMPLANT
GLOVE BIOGEL PI IND STRL 6.5 (GLOVE) ×3 IMPLANT
GLOVE BIOGEL PI IND STRL 7.0 (GLOVE) ×3 IMPLANT
GLOVE BIOGEL PI INDICATOR 6.5 (GLOVE) ×2
GLOVE BIOGEL PI INDICATOR 7.0 (GLOVE) ×3
GLOVE ECLIPSE 6.5 STRL STRAW (GLOVE) ×6 IMPLANT
GLOVE SURG SS PI 6.5 STRL IVOR (GLOVE) ×2 IMPLANT
GOWN PREVENTION PLUS LG XLONG (DISPOSABLE) ×14 IMPLANT
GOWN STRL REIN XL XLG (GOWN DISPOSABLE) ×2 IMPLANT
NDL INSUFFLATION 14GA 120MM (NEEDLE) ×1 IMPLANT
NEEDLE INSUFFLATION 14GA 120MM (NEEDLE) ×3 IMPLANT
NS IRRIG 1000ML POUR BTL (IV SOLUTION) ×3 IMPLANT
PACK LAVH (CUSTOM PROCEDURE TRAY) ×3 IMPLANT
PAD OB MATERNITY 4.3X12.25 (PERSONAL CARE ITEMS) ×2 IMPLANT
PROTECTOR NERVE ULNAR (MISCELLANEOUS) ×3 IMPLANT
SEALER TISSUE G2 CVD JAW 45CM (ENDOMECHANICALS) ×3 IMPLANT
SET IRRIG TUBING LAPAROSCOPIC (IRRIGATION / IRRIGATOR) IMPLANT
SOLUTION ELECTROLUBE (MISCELLANEOUS) ×2 IMPLANT
STRIP CLOSURE SKIN 1/4X3 (GAUZE/BANDAGES/DRESSINGS) IMPLANT
SUT MON AB 2-0 CT1 36 (SUTURE) ×8 IMPLANT
SUT VIC AB 0 CT1 18XCR BRD8 (SUTURE) ×6 IMPLANT
SUT VIC AB 0 CT1 8-18 (SUTURE) ×9
SUT VICRYL 0 TIES 12 18 (SUTURE) ×3 IMPLANT
SUT VICRYL 0 UR6 27IN ABS (SUTURE) ×2 IMPLANT
SUT VICRYL 4-0 PS2 18IN ABS (SUTURE) ×3 IMPLANT
TOWEL OR 17X24 6PK STRL BLUE (TOWEL DISPOSABLE) ×6 IMPLANT
TRAY FOLEY CATH 14FR (SET/KITS/TRAYS/PACK) ×3 IMPLANT
TROCAR Z-THREAD BLADED 11X100M (TROCAR) ×3 IMPLANT
TROCAR Z-THREAD BLADED 5X100MM (TROCAR) ×6 IMPLANT
WARMER LAPAROSCOPE (MISCELLANEOUS) ×3 IMPLANT
WATER STERILE IRR 1000ML POUR (IV SOLUTION) ×3 IMPLANT

## 2011-10-11 NOTE — Transfer of Care (Signed)
Immediate Anesthesia Transfer of Care Note  Patient: [name]  Procedure(s) Performed: Procedure(s) (LRB): LAPAROSCOPIC ASSISTED VAGINAL HYSTERECTOMY (N/A)  Patient Location: PACU  Anesthesia Type: General  Level of Consciousness: awake  Airway & Oxygen Therapy: Patient Spontanous Breathing and Patient connected to nasal cannula oxygen  Post-op Assessment: Report given to PACU RN and Post -op Vital signs reviewed and stable  Post vital signs: stable  Complications: No apparent anesthesia complications

## 2011-10-11 NOTE — Addendum Note (Signed)
Addendum  created 10/11/11 1438 by Gertie Fey, CRNA   Modules edited:Notes Section

## 2011-10-11 NOTE — Anesthesia Postprocedure Evaluation (Signed)
  Anesthesia Post-op Note  Patient: [name]  Procedure(s) Performed: Procedure(s) (LRB): LAPAROSCOPIC ASSISTED VAGINAL HYSTERECTOMY (N/A)  Patient Location: PACU and Women's Unit  Anesthesia Type: General  Level of Consciousness: awake, alert  and oriented  Airway and Oxygen Therapy: Patient Spontanous Breathing  Post-op Pain: none  Post-op Assessment: Post-op Vital signs reviewed  Post-op Vital Signs: Reviewed and stable  Complications: No apparent anesthesia complications

## 2011-10-11 NOTE — Anesthesia Preprocedure Evaluation (Addendum)
Anesthesia Evaluation  Patient identified by MRN, date of birth, ID band Patient awake    Reviewed: Allergy & Precautions, H&P , NPO status , Patient's Chart, lab work & pertinent test results  Airway Mallampati: III TM Distance: >3 FB Neck ROM: Full    Dental No notable dental hx. (+) Teeth Intact   Pulmonary neg pulmonary ROS,  breath sounds clear to auscultation  Pulmonary exam normal       Cardiovascular negative cardio ROS  Rhythm:Regular Rate:Normal     Neuro/Psych CVA negative psych ROS   GI/Hepatic negative GI ROS, Neg liver ROS,   Endo/Other  negative endocrine ROS  Renal/GU negative Renal ROS  negative genitourinary   Musculoskeletal negative musculoskeletal ROS (+)   Abdominal (+) + obese,   Peds  Hematology negative hematology ROS (+)   Anesthesia Other Findings   Reproductive/Obstetrics Pelvic Pain                          Anesthesia Physical Anesthesia Plan  ASA: II  Anesthesia Plan: General   Post-op Pain Management:    Induction: Intravenous  Airway Management Planned: Oral ETT  Additional Equipment:   Intra-op Plan:   Post-operative Plan: Extubation in OR  Informed Consent: I have reviewed the patients History and Physical, chart, labs and discussed the procedure including the risks, benefits and alternatives for the proposed anesthesia with the patient or authorized representative who has indicated his/her understanding and acceptance.   Dental advisory given  Plan Discussed with: Anesthesiologist, CRNA and Surgeon  Anesthesia Plan Comments:         Anesthesia Quick Evaluation

## 2011-10-11 NOTE — Op Note (Signed)
Preoperative diagnosis pelvic pain, symptomatic leiomyoma  Postoperative diagnosis: Same  Procedure: LAVH  Surgeon: Marcelle Overlie  Assistant: Huntley Dec  EBL: 200 cc  Specimens removed: Uterus, to pathology  Drains: Foley to straight drain  Procedure and findings:  The patient taken the operating room after an adequate level of general endotracheal anesthesia was obtained with the legs in stirrups the abdomen perineum and vagina prepped and draped in the usual fashion for LAVH in and out catheter used to drain the bladder, EUA carried out uterus upper limit of normal size slightly irregular adnexa negative. Appropriate timeout for taken at that point.  Attention directed to the abdomen the subumbilical area was infiltrated with quarter percent Marcaine plain small incision was made in the varies needle was introduced without difficulty. After 3-1/2 L pneumoperitoneum syncopated lap scopic trocar and sleeve were then introduced that difficulty. There was no evidence of any bleeding or trauma. 3 finger breaths above the symphysis in the midline a 5 mm trocar was inserted a blunt probe was used for manipulation. The patient was then placed in Trendelenburg and the pelvic findings as follows  Uterus itself was slightly irregular at the left fundus with a known 3 cm fibroid bilateral adnexa unremarkable the anterior bladder flap area and posterior cul-de-sac were free and clear upper abdomen was otherwise unremarkable starting on the right the utero-ovarian pedicle was coagulated and divided using the in seal device down to and including the round ligament the exact same repeated on the opposite side with excellent hemostasis. The vaginal portion the procedure started at this point.  The legs were extended weighted speculum was positioned cervix grasped with tenaculum the cervical vaginal mucosa was incised posterior culdotomy performed without difficulty. The bladder flap was advanced superiorly with sharp  and blunt dissection until the peritoneum could be identified this was entered sharply and a retractor then used to gently elevate the bladder out of the field in sequential manner the LigaSure device was then used to coagulated and divided the uterosacral ligament, cardinal ligament, uterine vasculature pedicles and upper broad ligament pedicles. Once this was completed the fundus of the uterus is then delivered posterior remaining pedicles were coagulated and divided. Vaginal cuff was then closed with a running locked suture 2-0 Monocryl from 3 to 9:00 prior to closure sponge denies precast reported as correct x2 mucosa then closed right to left with interrupted 2-0 Monocryl sutures with excellent hemostasis. Foley catheter positioned draining clear urine.  Repeat laparoscopy at that point revealed the operative site to be hemostatic even at reduced pressure. Instruments removed gas less escape defect closed with 4-0 Vicryl subcuticular at the umbilicus and Dermabond on the lower incision she tolerated this well went to recovery room in good condition.  Dictated with [facility]  [name] M. Milana Obey.D.

## 2011-10-11 NOTE — Progress Notes (Signed)
The patient was re-examined with no change in status 

## 2011-10-11 NOTE — Anesthesia Postprocedure Evaluation (Signed)
Anesthesia Post Note  Patient: [name]  Procedure(s) Performed: Procedure(s) (LRB): LAPAROSCOPIC ASSISTED VAGINAL HYSTERECTOMY (N/A)  Anesthesia type: GA  Patient location: PACU  Post pain: Pain level controlled  Post assessment: Post-op Vital signs reviewed  Last Vitals:  Filed Vitals:   10/11/11 0930  BP: 96/53  Pulse: 48  Temp:   Resp: 18    Post vital signs: Reviewed  Level of consciousness: sedated  Complications: No apparent anesthesia complications

## 2011-10-12 ENCOUNTER — Encounter (HOSPITAL_COMMUNITY): Payer: Self-pay | Admitting: Obstetrics and Gynecology

## 2011-10-12 DIAGNOSIS — R102 Pelvic and perineal pain: Secondary | ICD-10-CM

## 2011-10-12 DIAGNOSIS — D219 Benign neoplasm of connective and other soft tissue, unspecified: Secondary | ICD-10-CM

## 2011-10-12 LAB — CBC
MCH: 29.9 pg (ref 26.0–34.0)
MCHC: 32.9 g/dL (ref 30.0–36.0)
Platelets: 244 10*3/uL (ref 150–400)

## 2011-10-12 MED ORDER — OXYCODONE-ACETAMINOPHEN 5-325 MG PO TABS: 1.0000 | ORAL_TABLET | ORAL | Status: AC | PRN

## 2011-10-12 MED ORDER — IBUPROFEN 800 MG PO TABS: 800.0000 mg | ORAL_TABLET | Freq: Three times a day (TID) | ORAL | Status: AC | PRN

## 2011-10-12 NOTE — Progress Notes (Signed)
1 Day Post-Op Procedure(s) (LRB): LAPAROSCOPIC ASSISTED VAGINAL HYSTERECTOMY (N/A)  Subjective: Patient reports tolerating PO.    Objective: I have reviewed patient's vital signs, medications and labs. CBC    Component Value Date/Time   WBC 11.7* 10/12/2011 0530   RBC 3.74* 10/12/2011 0530   HGB 11.2* 10/12/2011 0530   HCT 34.0* 10/12/2011 0530   PLT 244 10/12/2011 0530   MCV 90.9 10/12/2011 0530   MCH 29.9 10/12/2011 0530   MCHC 32.9 10/12/2011 0530   RDW 13.0 10/12/2011 0530     abd soft + BS, Incs C/D  Assessment: s/p Procedure(s) (LRB): LAPAROSCOPIC ASSISTED VAGINAL HYSTERECTOMY (N/A): stable  Plan: Discharge home  LOS: 1 day    Cagney Steenson M 10/12/2011, 8:23 AM

## 2011-10-12 NOTE — Progress Notes (Signed)
Pt is discharged in the care of Husband. Downstairs per ambulatory. Stable. Spirits good. Denies any pain or discomfort. Abdominal operative sites are  Clean and dry. Scanty amtof vaginal bleeding noted on V-pad. Understood all discharge instructions. Questions asked and answered.

## 2011-10-12 NOTE — Discharge Summary (Signed)
Physician Discharge Summary  Patient ID: [name] MRN: [id number] DOB/AGE: [birth year] 46 y.o.  Admit date: 10/11/2011 Discharge date: 10/12/2011  Admission Diagnoses:  Discharge Diagnoses:  Active Problems:  Leiomyoma  Female pelvic pain   Discharged Condition: good  Hospital Course: LAVH, D/C on POD #1, afeb at D/C. tol PO, abd exam benign  Consults: None  Significant Diagnostic Studies: labs:  CBC    Component Value Date/Time   WBC 11.7* 10/12/2011 0530   RBC 3.74* 10/12/2011 0530   HGB 11.2* 10/12/2011 0530   HCT 34.0* 10/12/2011 0530   PLT 244 10/12/2011 0530   MCV 90.9 10/12/2011 0530   MCH 29.9 10/12/2011 0530   MCHC 32.9 10/12/2011 0530   RDW 13.0 10/12/2011 0530      Treatments: surgery: LAVH  Discharge Exam: Blood pressure 96/62, pulse 58, temperature 98.3 F (36.8 C), temperature source Oral, resp. rate 14, height 5\' 2"  (1.575 m), weight 196 lb (88.905 kg), SpO2 100.00%. Abd soft + BS, Incs C/D  Disposition: 01-Home or Self Care   Medication List  As of 10/12/2011  8:27 AM   STOP taking these medications         Calcium-Vitamin D 600-400 MG-UNIT Tabs      HYDROcodone-ibuprofen 7.5-200 MG per tablet      VITAMIN B 12 PO      vitamin E 400 UNIT capsule         TAKE these medications         GLUCOSAMINE 1500 COMPLEX PO   Take 1,500 mg by mouth daily.      ibuprofen 800 MG tablet   Commonly known as: ADVIL,MOTRIN   Take 1 tablet (800 mg total) by mouth every 8 (eight) hours as needed (mild pain).      OMEGA 3 PO   Take 800 mg by mouth daily.      oxyCODONE-acetaminophen 5-325 MG per tablet   Commonly known as: PERCOCET/ROXICET   Take 1-2 tablets by mouth every 4 (four) hours as needed (moderate to severe pain (when tolerating fluids)).           Follow-up Information    Follow up with Meriel Pica, MD. Schedule an appointment as soon as possible for a visit in 7 days.   Contact information:   [address] [place] [address] [phone]          Signed: Meriel Pica 10/12/2011, 8:27 AM

## 2011-12-05 ENCOUNTER — Other Ambulatory Visit: Payer: Self-pay | Admitting: Obstetrics and Gynecology

## 2011-12-05 DIAGNOSIS — Z1231 Encounter for screening mammogram for malignant neoplasm of breast: Secondary | ICD-10-CM

## 2012-01-15 ENCOUNTER — Ambulatory Visit
Admission: RE | Admit: 2012-01-15 | Discharge: 2012-01-15 | Disposition: A | Discharge status: Home / Self Care | Payer: 59 | Source: Ambulatory Visit | Attending: Obstetrics and Gynecology | Admitting: Obstetrics and Gynecology

## 2012-01-15 DIAGNOSIS — Z1231 Encounter for screening mammogram for malignant neoplasm of breast: Secondary | ICD-10-CM

## 2012-01-17 ENCOUNTER — Other Ambulatory Visit: Payer: Self-pay | Admitting: Obstetrics and Gynecology

## 2012-01-17 DIAGNOSIS — R928 Other abnormal and inconclusive findings on diagnostic imaging of breast: Secondary | ICD-10-CM

## 2012-01-23 ENCOUNTER — Ambulatory Visit
Admission: RE | Admit: 2012-01-23 | Discharge: 2012-01-23 | Disposition: A | Discharge status: Home / Self Care | Payer: 59 | Source: Ambulatory Visit | Attending: Obstetrics and Gynecology | Admitting: Obstetrics and Gynecology

## 2012-01-23 DIAGNOSIS — R928 Other abnormal and inconclusive findings on diagnostic imaging of breast: Secondary | ICD-10-CM

## 2012-12-31 ENCOUNTER — Other Ambulatory Visit: Payer: Self-pay

## 2012-12-31 DIAGNOSIS — Z1231 Encounter for screening mammogram for malignant neoplasm of breast: Secondary | ICD-10-CM

## 2013-01-28 ENCOUNTER — Ambulatory Visit
Admission: RE | Admit: 2013-01-28 | Discharge: 2013-01-28 | Disposition: A | Discharge status: Home / Self Care | Payer: 59 | Source: Ambulatory Visit

## 2013-01-28 DIAGNOSIS — Z1231 Encounter for screening mammogram for malignant neoplasm of breast: Secondary | ICD-10-CM

## 2013-09-14 IMAGING — US US BREAST*L*
1 series · 12 of 12 positions shown · non-contrast
Comparison: [DATE] [DATE], [DATE], [DATE] [DATE], [DATE], [DATE] [DATE], [DATE]

CLINICAL DATA: Called back from screening mammogram for possible
masses left breast

DIGITAL DIAGNOSTIC LEFT MAMMOGRAM January 23, 2012 AND LEFT BREAST
ULTRASOUND:

[Series 1: us breast*left* · 12 of 12 slices shown]
[im 1/12]
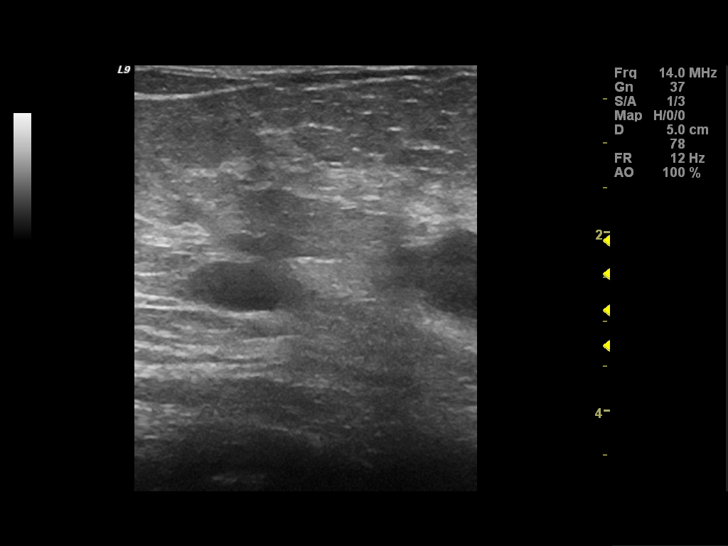
[im 2/12]
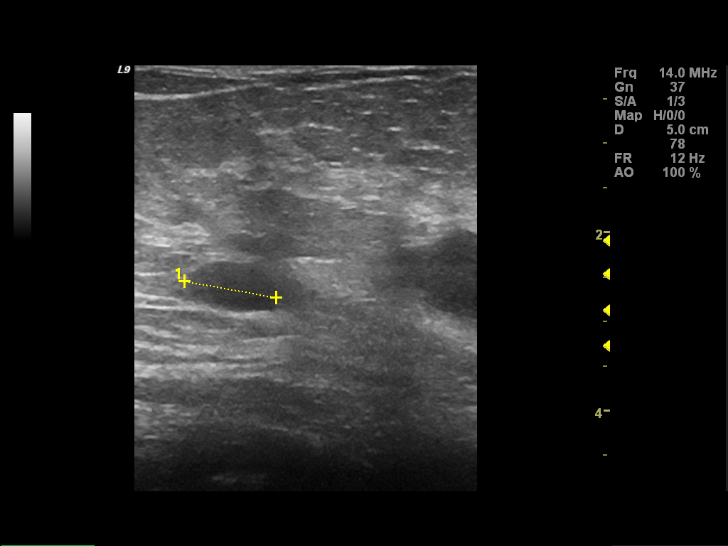
[im 3/12]
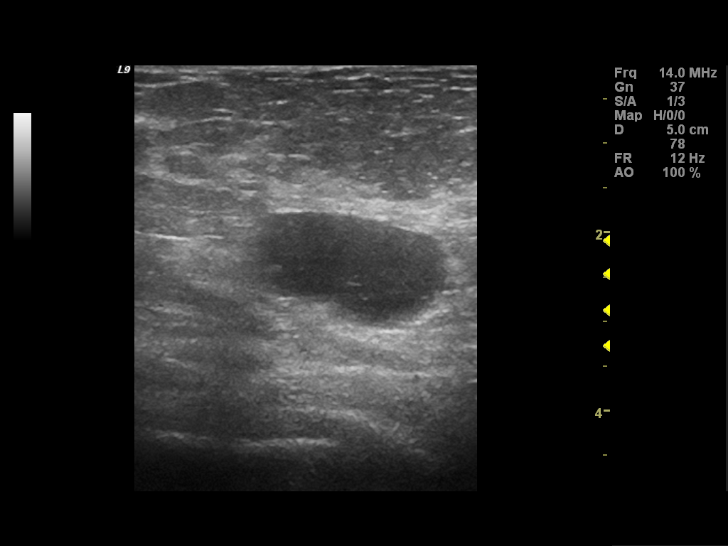
[im 4/12]
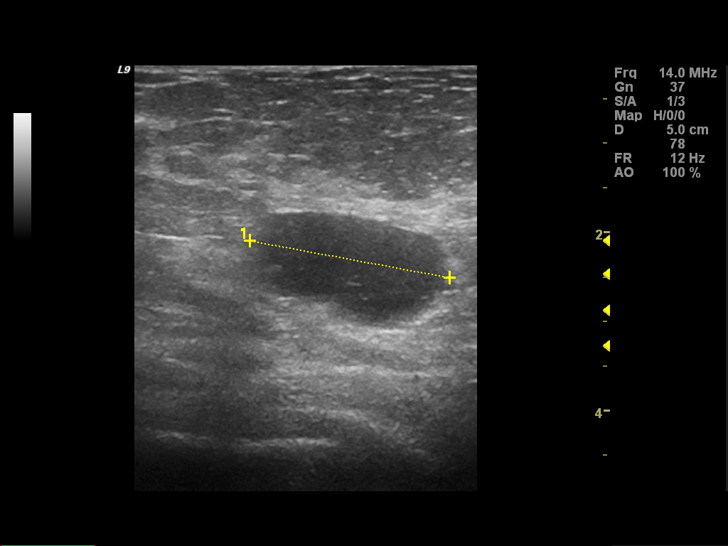
[im 5/12]
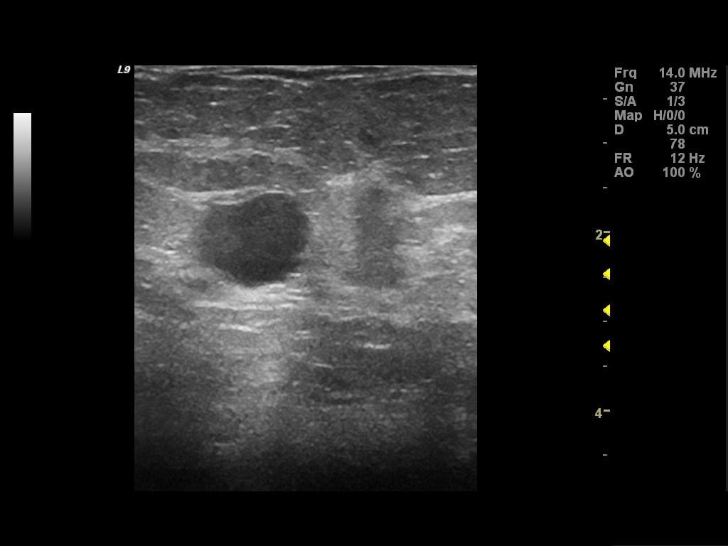
[im 6/12]
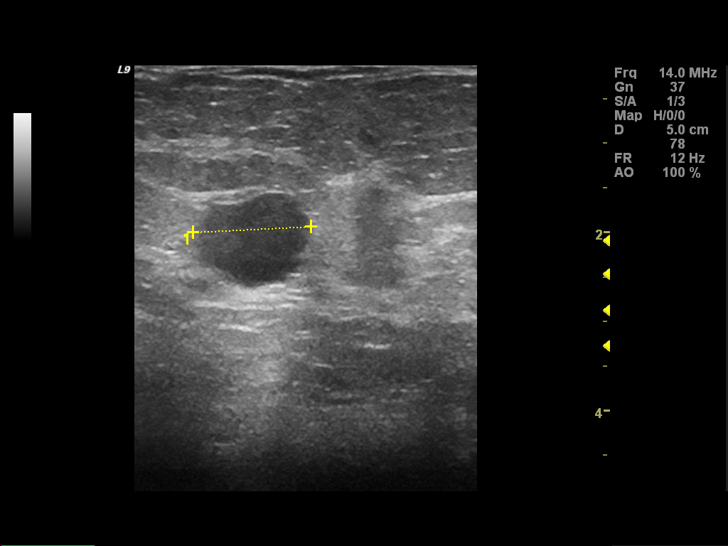
[im 7/12]
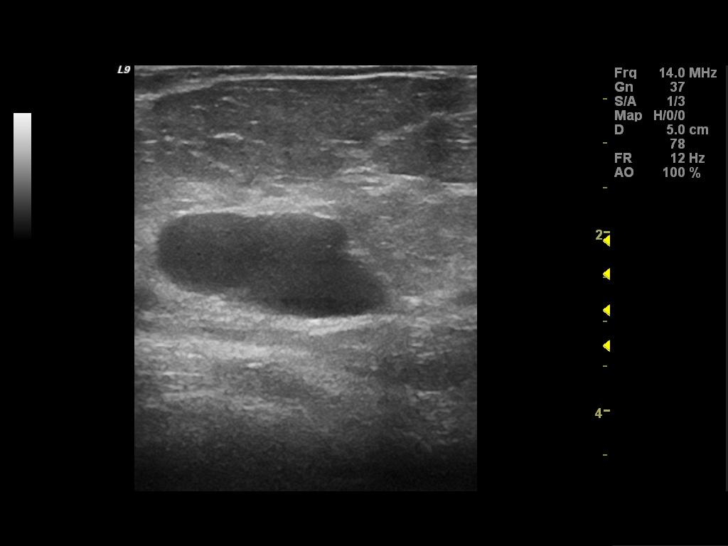
[im 8/12]
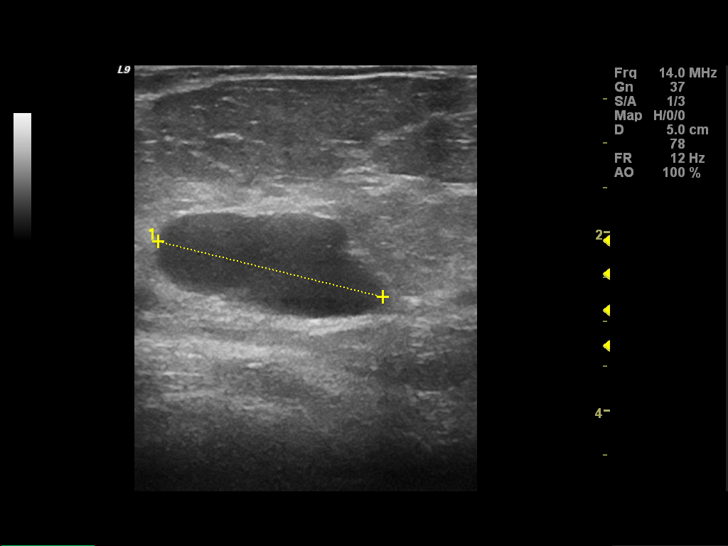
[im 9/12]
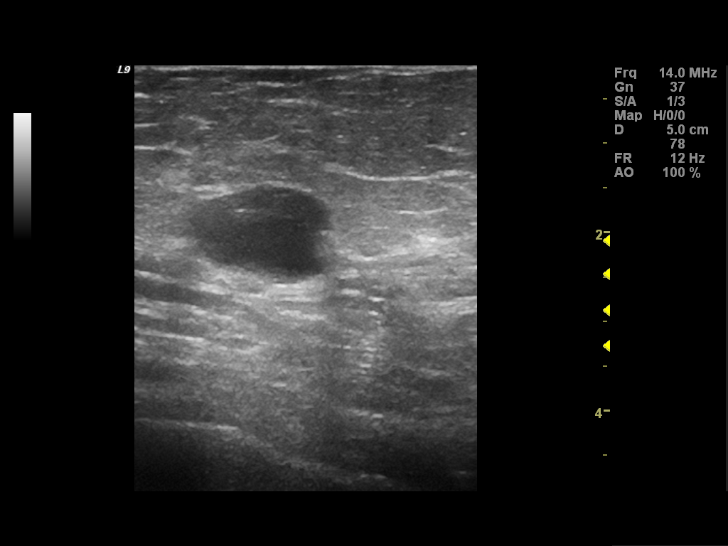
[im 10/12]
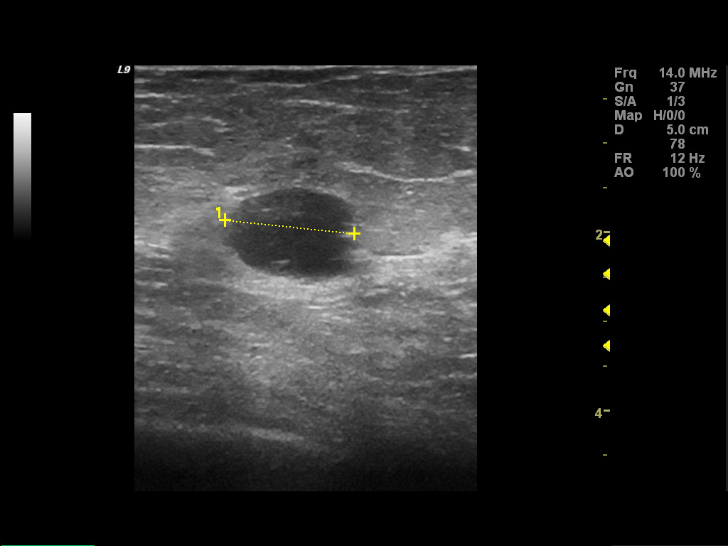
[im 11/12]
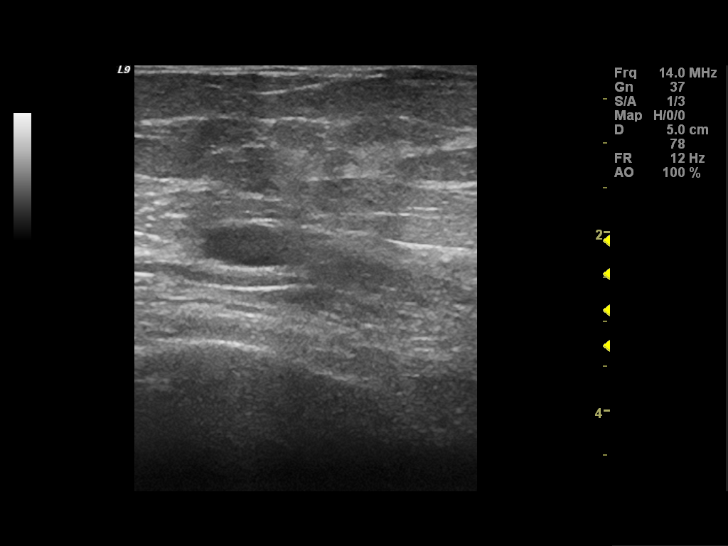
[im 12/12]
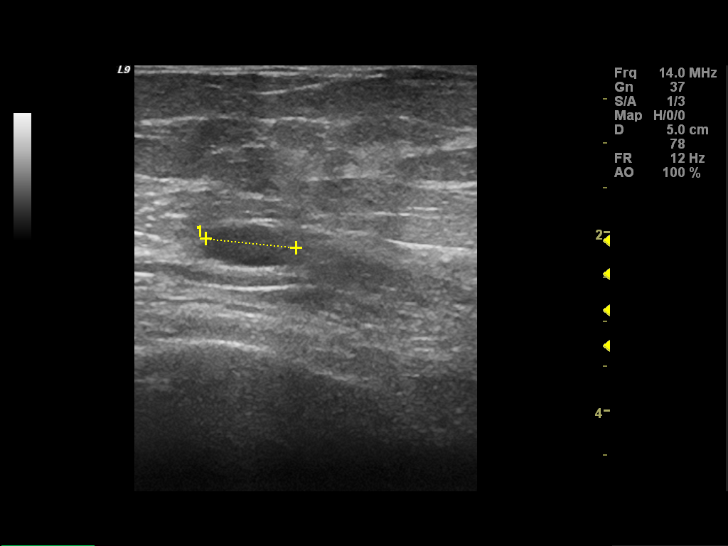

[12 of 12 positions shown; findings below may reference images not displayed]

FINDINGS: Spot compression CC and MLO views of the left breast are
submitted.  Previously questioned masses persist on additional
views.

Ultrasound is performed, showing three simple cysts at the left
breast [DATE] position o'clock 7 cm from the nipple correlating to
the mammographic finding; the largest cyst measures 2.6 cm.
IMPRESSION: Benign findings.

RECOMMENDATION:
Routine screening mammogram back on schedule.

I have discussed the findings and recommendations with the patient.
Results were also provided in writing at the conclusion of the
visit.

BI-RADS CATEGORY 2:  Benign finding(s).

## 2014-05-15 ENCOUNTER — Ambulatory Visit (HOSPITAL_COMMUNITY)
Admission: RE | Admit: 2014-05-15 | Discharge: 2014-05-15 | Disposition: A | Discharge status: Home / Self Care | Payer: 59 | Source: Ambulatory Visit | Attending: Rheumatology | Admitting: Rheumatology

## 2014-05-15 ENCOUNTER — Other Ambulatory Visit (HOSPITAL_COMMUNITY): Payer: Self-pay | Admitting: Rheumatology

## 2014-05-15 DIAGNOSIS — M5127 Other intervertebral disc displacement, lumbosacral region: Secondary | ICD-10-CM | POA: Insufficient documentation

## 2014-05-15 DIAGNOSIS — M549 Dorsalgia, unspecified: Secondary | ICD-10-CM

## 2014-05-15 DIAGNOSIS — R29898 Other symptoms and signs involving the musculoskeletal system: Secondary | ICD-10-CM

## 2014-05-15 DIAGNOSIS — M4806 Spinal stenosis, lumbar region: Secondary | ICD-10-CM | POA: Diagnosis not present

## 2014-05-15 DIAGNOSIS — M545 Low back pain: Secondary | ICD-10-CM | POA: Diagnosis present

## 2016-01-19 ENCOUNTER — Encounter: Payer: Self-pay | Admitting: *Deleted

## 2016-01-19 DIAGNOSIS — Z8673 Personal history of transient ischemic attack (TIA), and cerebral infarction without residual deficits: Secondary | ICD-10-CM

## 2016-01-19 DIAGNOSIS — M19071 Primary osteoarthritis, right ankle and foot: Secondary | ICD-10-CM

## 2016-01-19 DIAGNOSIS — M170 Bilateral primary osteoarthritis of knee: Secondary | ICD-10-CM

## 2016-01-19 DIAGNOSIS — M19072 Primary osteoarthritis, left ankle and foot: Secondary | ICD-10-CM

## 2016-01-19 DIAGNOSIS — M25561 Pain in right knee: Secondary | ICD-10-CM | POA: Insufficient documentation

## 2016-01-19 DIAGNOSIS — M5136 Other intervertebral disc degeneration, lumbar region: Secondary | ICD-10-CM

## 2016-01-19 DIAGNOSIS — M51369 Other intervertebral disc degeneration, lumbar region without mention of lumbar back pain or lower extremity pain: Secondary | ICD-10-CM

## 2016-01-19 DIAGNOSIS — H15123 Nodular episcleritis, bilateral: Secondary | ICD-10-CM | POA: Insufficient documentation

## 2016-01-19 HISTORY — DX: Other intervertebral disc degeneration, lumbar region: M51.36

## 2016-01-19 HISTORY — DX: Other intervertebral disc degeneration, lumbar region without mention of lumbar back pain or lower extremity pain: M51.369

## 2016-01-19 HISTORY — DX: Bilateral primary osteoarthritis of knee: M17.0

## 2016-01-19 HISTORY — DX: Primary osteoarthritis, right ankle and foot: M19.071

## 2016-01-19 HISTORY — DX: Personal history of transient ischemic attack (TIA), and cerebral infarction without residual deficits: Z86.73

## 2016-01-19 NOTE — Progress Notes (Deleted)
*IMAGE* Office Visit Note  Patient: [name]             Date of Birth: 1965/07/08           MRN: [id number]             PCP: Cyndi Bender, PA-C Referring: Cyndi Bender, PA-C Visit Date: 01/20/2016 Occupation:@GUAROCC @    Subjective:  No chief complaint on file. Follow-up on degenerative disc disease of the lumbar spine and OA of the knee joint.  History of Present Illness: [name] is a 50 y.o. female last seen 08/09/2015. On that visit patient was seen by Dr. Estanislado Pandy for DDD of the L-spine with ongoing discomfort but improvement was noticed after PT. Patient's obesity was improving as a result of intentional weight loss. The knee joints were hurting more and so Euflexxa was applied for and Voltaren gel was refilled.   Today, the patient states ***   Activities of Daily Living:  Patient reports morning stiffness for 15 minutes.   Patient Denies nocturnal pain.  Difficulty dressing/grooming: Reports Difficulty climbing stairs: Reports Difficulty getting out of chair: Reports Difficulty using hands for taps, buttons, cutlery, and/or writing: Denies   Review of Systems  Constitutional: Negative for fatigue.  HENT: Negative for mouth sores and mouth dryness.   Eyes: Negative for dryness.  Respiratory: Negative for shortness of breath.   Gastrointestinal: Negative for constipation and diarrhea.  Musculoskeletal: Positive for arthralgias and joint pain. Negative for myalgias and myalgias.  Skin: Negative for sensitivity to sunlight.  Psychiatric/Behavioral: Negative for decreased concentration and sleep disturbance.    PMFS History:  Patient Active Problem List   Diagnosis Date Noted  . Osteoarthritis of both knees 01/19/2016  . Osteoarthritis of both feet 01/19/2016  . History of TIA (transient ischemic attack) 01/19/2016  . DDD (degenerative disc disease), lumbar 01/19/2016  . Nodular episcleritis of both eyes 01/19/2016  . Pain in right knee 01/19/2016   . Leiomyoma 10/12/2011  . Female pelvic pain 10/12/2011    Past Medical History:  Diagnosis Date  . DDD (degenerative disc disease), lumbar 01/19/2016  . History of TIA (transient ischemic attack) 01/19/2016  . Osteoarthritis of both feet 01/19/2016  . Osteoarthritis of both knees 01/19/2016  . Stroke [facility]) 5/12    No family history on file. Past Surgical History:  Procedure Laterality Date  . ABDOMINAL HYSTERECTOMY    . CHOLECYSTECTOMY    . KNEE ARTHROSCOPY     x2  . LAPAROSCOPIC ASSISTED VAGINAL HYSTERECTOMY  10/11/2011   Procedure: LAPAROSCOPIC ASSISTED VAGINAL HYSTERECTOMY;  Surgeon: Margarette Asal, MD;  Location: [facility];  Service: Gynecology;  Laterality: N/A;  . LASIK     Social History   Social History Narrative  . No narrative on file     Objective: Vital Signs: LMP 01/09/2011    Physical Exam  Constitutional: She is oriented to person, place, and time. She appears well-developed and well-nourished.  HENT:  Head: Normocephalic and atraumatic.  Eyes: EOM are normal. Pupils are equal, round, and reactive to light.  Cardiovascular: Normal rate, regular rhythm and normal heart sounds.  Exam reveals no gallop and no friction rub.   No murmur heard. Pulmonary/Chest: Effort normal and breath sounds normal. She has no wheezes. She has no rales.  Abdominal: Soft. Bowel sounds are normal. She exhibits no distension. There is no tenderness. There is no guarding. No hernia.  Musculoskeletal: Normal range of motion. She exhibits no edema, tenderness or deformity.  Lymphadenopathy:  She has no cervical adenopathy.  Neurological: She is alert and oriented to person, place, and time. Coordination normal.  Skin: Skin is warm and dry. Capillary refill takes less than 2 seconds. No rash noted.  Psychiatric: She has a normal mood and affect. Her behavior is normal.  Nursing note and vitals reviewed.    Musculoskeletal Exam:  Full range of motion of all joints.  Grip Strength  is equal and strong bilaterally  fibromyalgia tender points are all absent.  CDAI Exam: CDAI Homunculus Exam:   Joint Counts:  CDAI Tender Joint count: 0 CDAI Swollen Joint count: 0     Investigation: No additional findings.   Imaging: No results found.  Speciality Comments: No specialty comments available.    Procedures:  No procedures performed Allergies: Celebrex [celecoxib]   Assessment / Plan: Visit Diagnoses: DDD (degenerative disc disease), lumbar  Primary osteoarthritis of both knees  Primary osteoarthritis of both feet  Nodular episcleritis of both eyes - No Recurrence since May 2017 Visit.  Chronic pain of right knee   ***see if recent labs are avail , if not, add cbc w/ diff , cmp w/ gfr today  Orders: No orders of the defined types were placed in this encounter.  No orders of the defined types were placed in this encounter.   Face-to-face time spent with patient was 30 minutes. 50% of time was spent in counseling and coordination of care.  Follow-Up Instructions: No Follow-up on file.

## 2016-01-20 ENCOUNTER — Ambulatory Visit: Payer: Self-pay | Admitting: Rheumatology

## 2016-07-13 ENCOUNTER — Ambulatory Visit (INDEPENDENT_AMBULATORY_CARE_PROVIDER_SITE_OTHER): Payer: 59 | Admitting: Rheumatology

## 2016-07-13 ENCOUNTER — Encounter: Payer: Self-pay | Admitting: Rheumatology

## 2016-07-13 VITALS — BP 114/70 | HR 57 | Resp 12 | Ht 62.0 in | Wt 162.0 lb

## 2016-07-13 DIAGNOSIS — M170 Bilateral primary osteoarthritis of knee: Secondary | ICD-10-CM | POA: Diagnosis not present

## 2016-07-13 DIAGNOSIS — M19071 Primary osteoarthritis, right ankle and foot: Secondary | ICD-10-CM

## 2016-07-13 DIAGNOSIS — M47818 Spondylosis without myelopathy or radiculopathy, sacral and sacrococcygeal region: Secondary | ICD-10-CM

## 2016-07-13 DIAGNOSIS — M25561 Pain in right knee: Secondary | ICD-10-CM

## 2016-07-13 DIAGNOSIS — M5136 Other intervertebral disc degeneration, lumbar region: Secondary | ICD-10-CM

## 2016-07-13 DIAGNOSIS — G8929 Other chronic pain: Secondary | ICD-10-CM | POA: Diagnosis not present

## 2016-07-13 DIAGNOSIS — M1611 Unilateral primary osteoarthritis, right hip: Secondary | ICD-10-CM

## 2016-07-13 DIAGNOSIS — Z5181 Encounter for therapeutic drug level monitoring: Secondary | ICD-10-CM

## 2016-07-13 DIAGNOSIS — M461 Sacroiliitis, not elsewhere classified: Secondary | ICD-10-CM

## 2016-07-13 DIAGNOSIS — M19072 Primary osteoarthritis, left ankle and foot: Secondary | ICD-10-CM

## 2016-07-13 LAB — CBC WITH DIFFERENTIAL/PLATELET
BASOS ABS: 54 {cells}/uL (ref 0–200)
Basophils Relative: 1 %
EOS ABS: 54 {cells}/uL (ref 15–500)
EOS PCT: 1 %
HCT: 39.8 % (ref 35.0–45.0)
Hemoglobin: 13.2 g/dL (ref 11.7–15.5)
LYMPHS PCT: 26 %
Lymphs Abs: 1404 cells/uL (ref 850–3900)
MCH: 30.9 pg (ref 27.0–33.0)
MCHC: 33.2 g/dL (ref 32.0–36.0)
MCV: 93.2 fL (ref 80.0–100.0)
MONOS PCT: 7 %
MPV: 11.1 fL (ref 7.5–12.5)
Monocytes Absolute: 378 cells/uL (ref 200–950)
NEUTROS ABS: 3510 {cells}/uL (ref 1500–7800)
NEUTROS PCT: 65 %
PLATELETS: 302 10*3/uL (ref 140–400)
RBC: 4.27 MIL/uL (ref 3.80–5.10)
RDW: 13 % (ref 11.0–15.0)
WBC: 5.4 10*3/uL (ref 3.8–10.8)

## 2016-07-13 LAB — COMPLETE METABOLIC PANEL WITH GFR
AG RATIO: 1.2 ratio (ref 1.0–2.5)
ALK PHOS: 50 U/L (ref 33–115)
ALT: 13 U/L (ref 6–29)
AST: 15 U/L (ref 10–35)
Albumin: 3.8 g/dL (ref 3.6–5.1)
BUN/Creatinine Ratio: 18 Ratio (ref 6–22)
BUN: 11 mg/dL (ref 7–25)
CO2: 25 mmol/L (ref 20–31)
Calcium: 9.2 mg/dL (ref 8.6–10.2)
Chloride: 104 mmol/L (ref 98–110)
Creat: 0.61 mg/dL (ref 0.50–1.10)
Globulin: 3.1 g/dL (ref 1.9–3.7)
Glucose, Bld: 84 mg/dL (ref 65–99)
POTASSIUM: 4.9 mmol/L (ref 3.5–5.3)
SODIUM: 139 mmol/L (ref 135–146)
Total Bilirubin: 0.8 mg/dL (ref 0.2–1.2)
Total Protein: 6.9 g/dL (ref 6.1–8.1)

## 2016-07-13 MED ORDER — BACLOFEN 10 MG PO TABS: 10.0000 mg | ORAL_TABLET | Freq: Three times a day (TID) | ORAL | 3 refills | Status: DC | PRN

## 2016-07-13 MED ORDER — LIDOCAINE HCL 1 % IJ SOLN
2.0000 mL | INTRAMUSCULAR | Status: AC | PRN
  Administered 2016-07-13: 2 mL

## 2016-07-13 MED ORDER — DICLOFENAC SODIUM 1 % TD GEL: 4.0000 g | Freq: Four times a day (QID) | TRANSDERMAL | 3 refills | Status: AC

## 2016-07-13 MED ORDER — TRIAMCINOLONE ACETONIDE 40 MG/ML IJ SUSP
40.0000 mg | INTRAMUSCULAR | Status: AC | PRN
  Administered 2016-07-13: 40 mg via INTRA_ARTICULAR

## 2016-07-13 NOTE — Assessment & Plan Note (Addendum)
Euflexxa 3 right knees 11/08/2015

## 2016-07-13 NOTE — Progress Notes (Signed)
Office Visit Note  Patient: [name]             Date of Birth: [birth year]           MRN: [id number]             PCP: Cyndi Bender, PA-C Referring: Cyndi Bender, PA-C Visit Date: 07/13/2016 Occupation: @GUAROCC @    Subjective:  Follow-up   History of Present Illness: [name] is a 51 y.o. female  Right knee still doing well after euflexxa to right knee only completed Nov 08, 2015  c/o of low back pain Tender to touch Pinch sensation to back. Pain felt on awaken on Monday morning, July 10, 2016. Worked in the garden pulling weeds, etc on weekend and washed car. May have strained back.    Activities of Daily Living:  Patient reports morning stiffness for 30 minutes.   Patient Reports nocturnal pain.  Difficulty dressing/grooming: Reports Difficulty climbing stairs: Reports Difficulty getting out of chair: Reports Difficulty using hands for taps, buttons, cutlery, and/or writing: Reports   Review of Systems  Constitutional: Negative for fatigue.  HENT: Negative for mouth sores and mouth dryness.   Eyes: Negative for dryness.  Respiratory: Negative for shortness of breath.   Gastrointestinal: Negative for constipation and diarrhea.  Musculoskeletal: Negative for myalgias and myalgias.  Skin: Negative for sensitivity to sunlight.  Psychiatric/Behavioral: Negative for decreased concentration and sleep disturbance.    PMFS History:  Patient Active Problem List   Diagnosis Date Noted  . Osteoarthritis of both knees 01/19/2016  . Osteoarthritis of both feet 01/19/2016  . History of TIA (transient ischemic attack) 01/19/2016  . DDD (degenerative disc disease), lumbar 01/19/2016  . Nodular episcleritis of both eyes 01/19/2016  . Pain in right knee 01/19/2016  . Leiomyoma 10/12/2011  . Female pelvic pain 10/12/2011    Past Medical History:  Diagnosis Date  . DDD (degenerative disc disease), lumbar 01/19/2016  . History of TIA (transient ischemic  attack) 01/19/2016  . Osteoarthritis of both feet 01/19/2016  . Osteoarthritis of both knees 01/19/2016  . Stroke [facility]) 5/12    History reviewed. No pertinent family history. Past Surgical History:  Procedure Laterality Date  . ABDOMINAL HYSTERECTOMY    . CHOLECYSTECTOMY    . KNEE ARTHROSCOPY     x2  . LAPAROSCOPIC ASSISTED VAGINAL HYSTERECTOMY  10/11/2011   Procedure: LAPAROSCOPIC ASSISTED VAGINAL HYSTERECTOMY;  Surgeon: Margarette Asal, MD;  Location: [facility];  Service: Gynecology;  Laterality: N/A;  . LASIK     Social History   Social History Narrative  . No narrative on file     Objective: Vital Signs: BP 114/70 (BP Location: Left Arm, Patient Position: Sitting, Cuff Size: Normal)   Pulse (!) 57   Resp 12   Ht 5\' 2"  (1.575 m)   Wt 162 lb (73.5 kg)   LMP 01/09/2011   BMI 29.63 kg/m    Physical Exam  Constitutional: She is oriented to person, place, and time. She appears well-developed and well-nourished.  HENT:  Head: Normocephalic and atraumatic.  Eyes: EOM are normal. Pupils are equal, round, and reactive to light.  Cardiovascular: Normal rate, regular rhythm and normal heart sounds.  Exam reveals no gallop and no friction rub.   No murmur heard. Pulmonary/Chest: Effort normal and breath sounds normal. She has no wheezes. She has no rales.  Abdominal: Soft. Bowel sounds are normal. She exhibits no distension. There is no tenderness. There is no guarding. No hernia.  Musculoskeletal: Normal range of motion. She exhibits no edema, tenderness or deformity.  Lymphadenopathy:    She has no cervical adenopathy.  Neurological: She is alert and oriented to person, place, and time. Coordination normal.  Skin: Skin is warm and dry. Capillary refill takes less than 2 seconds. No rash noted.  Psychiatric: She has a normal mood and affect. Her behavior is normal.  Nursing note and vitals reviewed.    Musculoskeletal Exam:  Full range of motion of all joints Grip strength is  equal and strong bilaterally Fiber myalgia tender points are absent except left SI joint pain. Mild DIP PIP prominence bilaterally No synovitis on examination  CDAI Exam: CDAI Homunculus Exam:   Joint Counts:  CDAI Tender Joint count: 0 CDAI Swollen Joint count: 0  Global Assessments:  Patient Global Assessment: 8 Provider Global Assessment: 8  CDAI Calculated Score: 16  No synovitis on examination. Patient's pain is mainly to the left SI joint when she strained it this weekend working in the garden  Investigation: No additional findings.  No visits with results within 6 Month(s) from this visit.  Latest known visit with results is:  Admission on 10/11/2011, Discharged on 10/12/2011  Component Date Value Ref Range Status  . Preg Test, Ur 10/11/2011 NEGATIVE  NEGATIVE Final   Comment:                                 THE SENSITIVITY OF THIS                          METHODOLOGY IS >24 mIU/mL  . WBC 10/12/2011 11.7* 4.0 - 10.5 K/uL Final  . RBC 10/12/2011 3.74* 3.87 - 5.11 MIL/uL Final  . Hemoglobin 10/12/2011 11.2* 12.0 - 15.0 g/dL Final  . HCT 10/12/2011 34.0* 36.0 - 46.0 % Final  . MCV 10/12/2011 90.9  78.0 - 100.0 fL Final  . MCH 10/12/2011 29.9  26.0 - 34.0 pg Final  . MCHC 10/12/2011 32.9  30.0 - 36.0 g/dL Final  . RDW 10/12/2011 13.0  11.5 - 15.5 % Final  . Platelets 10/12/2011 244  150 - 400 K/uL Final     Imaging: No results found.  Speciality Comments: No specialty comments available.    Procedures:  Large Joint Inj Date/Time: 07/13/2016 8:46 AM Performed by: Eliezer Lofts Authorized by: Eliezer Lofts   Consent Given by:  Patient Site marked: the procedure site was marked   Timeout: prior to procedure the correct patient, procedure, and site was verified   Indications:  Pain Location:  Hip Site:  R hip joint Prep: patient was prepped and draped in usual sterile fashion   Needle Size:  27 G Needle Length:  1.5 inches Approach:   Superior Ultrasound Guidance: No   Fluoroscopic Guidance: No   Arthrogram: No   Medications:  40 mg triamcinolone acetonide 40 MG/ML; 2 mL lidocaine 1 % Aspiration Attempted: Yes   Aspirate amount (mL):  0 Patient tolerance:  Patient tolerated the procedure well with no immediate complications  PATIENT TOLERATED PROCEDURE WELL. THERE WERE NO COMPLICATIONS.  2 ml of 1% lidocaine w/ 40mg  of kenalog left si jt  Pain prior to injection "8" Few minutes after injection, pain is rated "5"     Allergies: Celebrex [celecoxib]   Assessment / Plan:     Visit Diagnoses: Primary osteoarthritis of both feet  Primary osteoarthritis of both  knees - Euflexxa 3 right knees 11/08/2015  Chronic pain of right knee  DDD (degenerative disc disease), lumbar  Osteoarthritis of sacroiliac joint  Medication monitoring encounter - On baclofen and Voltaren gel; stopped Robaxin (inadequate response) - Plan: CBC with Differential/Platelet, COMPLETE METABOLIC PANEL WITH GFR   Plan: #1: Osteoarthritis of both feet Doing well currently.  #2: OA of bilateral knee joint. Finished Euflexxa to the right knee 11/08/2015 continues to do well at the moment. Patient will call us and let us know if she needs [place] to apply for Euflex.  #3: History of DDD of the L-spine. Currently doing relatively well. Has learned exercises to the physical therapist and continues to do them.  #4: Right SI joint pain. See procedure note for full details. After informed consent was obtained the site was prepped in usual sterile fashion injected with 1% Xylocaine (2 mL's) mixed with 40 mg of Kenalog. Patient felt her pain was a prior to the procedure. Patient's pain was down to a 5 few minutes after the procedure.  #5: Return to clinic in 6 months  #6: CBC with differential and CMP with GFR done today since patient is on Voltaren gel when necessary and she is on baclofen. Patient is stopping her Robaxin since it causes her  inadequate response  #7: Intentional weight loss. Her last visit to our office on 08/09/2015, her weight was 180 pounds. Today patient's weight is 162 pounds. She is done with proper diet and exercise. She's been a make continued effort to lose weight   Orders: Orders Placed This Encounter  Procedures  . Large Joint Injection/Arthrocentesis  . CBC with Differential/Platelet  . COMPLETE METABOLIC PANEL WITH GFR   Meds ordered this encounter  Medications  . diclofenac sodium (VOLTAREN) 1 % GEL    Sig: Apply 4 g topically 4 (four) times daily.    Dispense:  3 Tube    Refill:  3    Order Specific Question:   Supervising Provider    Answer:   Bo Merino [[id number]]  . baclofen (LIORESAL) 10 MG tablet    Sig: Take 1 tablet (10 mg total) by mouth 3 (three) times daily as needed for muscle spasms.    Dispense:  90 tablet    Refill:  3    Order Specific Question:   Supervising Provider    Answer:   Bo Merino [phone]    Face-to-face time spent with patient was 30 minutes. 50% of time was spent in counseling and coordination of care.  Follow-Up Instructions: Return in about 6 months (around 01/12/2017) for DDD L-spine, left si joint pain, oa kj, intentional wt loss, .   Eliezer Lofts, PA-C  Note - This record has been created using [employer].  Chart creation errors have been sought, but may not always  have been located. Such creation errors do not reflect on  the standard of medical care.

## 2016-07-17 ENCOUNTER — Telehealth: Payer: Self-pay | Admitting: Radiology

## 2016-07-17 NOTE — Telephone Encounter (Signed)
-----   Message from Eliezer Lofts, [place] sent at 07/14/2016 11:43 AM EDT ----- Please send copy of labs to PCP, Cyndi Bender And tell patient CBC with differential and CMP with GFR within normal limits

## 2016-07-17 NOTE — Telephone Encounter (Signed)
Sent to PCP I have called patient to advise labs are normal

## 2016-11-21 ENCOUNTER — Telehealth: Payer: Self-pay | Admitting: Rheumatology

## 2016-11-21 NOTE — Telephone Encounter (Signed)
Patient calling due to increased pain, aching all over now. Gradually increased x1 month now. Please call patient to advise. Patient has rov scheduled for October.

## 2016-11-21 NOTE — Telephone Encounter (Signed)
Advise appointment with PCP

## 2016-11-21 NOTE — Telephone Encounter (Signed)
Attempted to contact the patient and left message for patient to call the office.  

## 2016-11-21 NOTE — Telephone Encounter (Signed)
Patient advised to make an appointment with her PCP. Patient verbalized understanding.

## 2016-11-21 NOTE — Telephone Encounter (Signed)
Patient states she is having a constant ache all over her body. Patient states the last two weeks her ankle has started swelling and tingling. Patient states she has been having increasing aching over the last month. Patient states she is on a muscle relaxer, Baclofen. Patient states that helps some but not enough. Patient states she stands at work because that is more comfortable. Patient describes it as almost like constantly having the flu. Please advise.

## 2016-12-31 NOTE — Progress Notes (Signed)
Office Visit Note  Patient: [name]             Date of Birth: 17-Mar-1966           MRN: [id number]             PCP: Cyndi Bender, PA-C Referring: Cyndi Bender, PA-C Visit Date: 01/12/2017 Occupation: '@GUAROCC' @    Subjective:  Lower back pain.   History of Present Illness: [name] is a 51 y.o. female with history of osteoarthritis and DDD. She states the cortisone injection to her lower back Lasix for couple of months. Now she's having increased pain in her lower back again. Her right knee joint has been hurting as well. She finished her Euflexxa injections to her right knee in August 2017. Her feet are not very bothersome currently. She has had no new episodes of episcleritis.  Activities of Daily Living:  Patient reports morning stiffness for 15 minutes.   Patient Reports nocturnal pain.  Difficulty dressing/grooming: Denies Difficulty climbing stairs: Denies Difficulty getting out of chair: Denies Difficulty using hands for taps, buttons, cutlery, and/or writing: Denies   Review of Systems  Constitutional: Positive for fatigue. Negative for night sweats, weight gain, weight loss and weakness.  HENT: Negative.  Negative for mouth sores, trouble swallowing, trouble swallowing, mouth dryness and nose dryness.   Eyes: Negative.  Negative for pain, redness, visual disturbance and dryness.  Respiratory: Negative.  Negative for cough, shortness of breath and difficulty breathing.   Cardiovascular: Negative for chest pain, palpitations, hypertension, irregular heartbeat and swelling in legs/feet.  Gastrointestinal: Negative.  Negative for blood in stool, constipation and diarrhea.  Endocrine: Negative for increased urination.  Genitourinary: Negative for vaginal dryness.  Musculoskeletal: Positive for arthralgias and joint pain. Negative for joint swelling, myalgias, muscle weakness, morning stiffness, muscle tenderness and myalgias.  Skin: Negative.  Negative for  color change, rash, hair loss, skin tightness, ulcers and sensitivity to sunlight.  Allergic/Immunologic: Negative for susceptible to infections.  Neurological: Negative for dizziness, numbness, headaches, memory loss and night sweats.  Hematological: Negative for swollen glands.  Psychiatric/Behavioral: Positive for sleep disturbance. Negative for depressed mood. The patient is not nervous/anxious.     PMFS History:  Patient Active Problem List   Diagnosis Date Noted  . Osteoarthritis of both knees 01/19/2016  . Osteoarthritis of both feet 01/19/2016  . History of TIA (transient ischemic attack) 01/19/2016  . DDD (degenerative disc disease), lumbar 01/19/2016  . Nodular episcleritis of both eyes 01/19/2016  . Pain in right knee 01/19/2016  . Leiomyoma 10/12/2011  . Female pelvic pain 10/12/2011    Past Medical History:  Diagnosis Date  . DDD (degenerative disc disease), lumbar 01/19/2016  . History of TIA (transient ischemic attack) 01/19/2016  . Osteoarthritis of both feet 01/19/2016  . Osteoarthritis of both knees 01/19/2016  . Stroke [facility]) 5/12    No family history on file. Past Surgical History:  Procedure Laterality Date  . ABDOMINAL HYSTERECTOMY    . CHOLECYSTECTOMY    . KNEE ARTHROSCOPY     x2  . LAPAROSCOPIC ASSISTED VAGINAL HYSTERECTOMY  10/11/2011   Procedure: LAPAROSCOPIC ASSISTED VAGINAL HYSTERECTOMY;  Surgeon: Margarette Asal, MD;  Location: [facility];  Service: Gynecology;  Laterality: N/A;  . LASIK     Social History   Social History Narrative  . No narrative on file     Objective: Vital Signs: BP 103/65 (BP Location: Left Arm, Patient Position: Sitting, Cuff Size: Normal)   Pulse  63   Ht '5\' 2"'  (1.575 m)   Wt 162 lb (73.5 kg)   LMP 01/09/2011   BMI 29.63 kg/m    Physical Exam  Constitutional: She is oriented to person, place, and time. She appears well-developed and well-nourished.  HENT:  Head: Normocephalic and atraumatic.  Eyes: Conjunctivae and  EOM are normal.  Neck: Normal range of motion.  Cardiovascular: Normal rate, regular rhythm, normal heart sounds and intact distal pulses.   Pulmonary/Chest: Effort normal and breath sounds normal.  Abdominal: Soft. Bowel sounds are normal.  Lymphadenopathy:    She has no cervical adenopathy.  Neurological: She is alert and oriented to person, place, and time.  Skin: Skin is warm and dry. Capillary refill takes less than 2 seconds.  Psychiatric: She has a normal mood and affect. Her behavior is normal.  Nursing note and vitals reviewed.    Musculoskeletal Exam: cervical and thoracic spine good range of motion . She had discomfort and pain in her left SI joint. Shoulder joints elbow joints wrist joints are good range of motion. Hip joints were good range of motion. She had discomfort with range of motion of her right knee joint without any warmth swelling or effusion. She has bilateral hallux rigidus.  CDAI Exam: No CDAI exam completed.    Investigation: No additional findings. CBC Latest Ref Rng & Units 07/13/2016 10/12/2011 10/04/2011  WBC 3.8 - 10.8 K/uL 5.4 11.7(H) 5.8  Hemoglobin 11.7 - 15.5 g/dL 13.2 11.2(L) 12.9  Hematocrit 35.0 - 45.0 % 39.8 34.0(L) 39.0  Platelets 140 - 400 K/uL 302 244 277   CMP Latest Ref Rng & Units 07/13/2016 08/03/2010  Glucose 65 - 99 mg/dL 84 102(H)  BUN 7 - 25 mg/dL 11 8  Creatinine 0.50 - 1.10 mg/dL 0.61 0.57  Sodium 135 - 146 mmol/L 139 138  Potassium 3.5 - 5.3 mmol/L 4.9 3.4(L)  Chloride 98 - 110 mmol/L 104 106  CO2 20 - 31 mmol/L 25 23  Calcium 8.6 - 10.2 mg/dL 9.2 9.2  Total Protein 6.1 - 8.1 g/dL 6.9 6.9  Total Bilirubin 0.2 - 1.2 mg/dL 0.8 0.8  Alkaline Phos 33 - 115 U/L 50 59  AST 10 - 35 U/L 15 15  ALT 6 - 29 U/L 13 14    Imaging: Xr Knee 3 View Right  Result Date: 01/12/2017 Moderate lateral compartment narrowing with lateral osteophytes was noted. Intercondylar osteophytes were noted. No chondrocalcinosis was noted. Severe  patellofemoral narrowing was noted. Flabella was noted. Impression: Moderate osteoarthritis and severe chondromalacia patella   Speciality Comments: No specialty comments available.    Procedures:  Large Joint Inj Date/Time: 01/12/2017 8:53 AM Performed by: Bo Merino Authorized by: Bo Merino   Consent Given by:  Patient Site marked: the procedure site was marked   Timeout: prior to procedure the correct patient, procedure, and site was verified   Indications:  Pain Location:  Sacroiliac Site:  L sacroiliac joint Prep: patient was prepped and draped in usual sterile fashion   Needle Size:  27 G Needle Length:  1.5 inches Approach:  Superior Ultrasound Guidance: No   Fluoroscopic Guidance: No   Arthrogram: No   Medications:  1 mL lidocaine 1 %; 40 mg triamcinolone acetonide 40 MG/ML Aspiration Attempted: Yes   Aspirate amount (mL):  0 Patient tolerance:  Patient tolerated the procedure well with no immediate complications   Allergies: Celebrex [celecoxib]   Assessment / Plan:     Visit Diagnoses: Primary osteoarthritis of both knees -  Last Eufflexa 10/2015 -she did well for over an year. Now the pain has recurred. She would like to have repeat series a few flecks injections. Plan: XR KNEE 3 VIEW RIGHT  Primary osteoarthritis of both feet: She has bilateral hallux rigidus chest is causing discomfort. Proper fitting shoes were discussed.  Chronic left SI joint pain: She's had problems with SI joint pain in the past. The left SI joint pain has recurred. After informed consent was obtained in different treatment options were discussed left SI joint was injected with cortisone as described above.  DDD (degenerative disc disease), lumbar: Chronic back pain.  Nodular episcleritis of both eyes - all autoimmune w/u negative. HLAB-27 negative. She has not had a recurrence and several years.  Other medical problems are listed as follows:  History of TIA (transient  ischemic attack)  History of cholecystectomy    Orders: Orders Placed This Encounter  Procedures  . Large Joint Injection/Arthrocentesis  . XR KNEE 3 VIEW RIGHT   No orders of the defined types were placed in this encounter.   Face-to-face time spent with patient was 30 minutes. Greater than 50% of time was spent in counseling and coordination of care.  Follow-Up Instructions: Return in about 6 months (around 07/13/2017) for OsteoarthritisDDD.   Bo Merino, MD  Note - This record has been created using Editor, commissioning.  Chart creation errors have been sought, but may not always  have been located. Such creation errors do not reflect on  the standard of medical care.

## 2017-01-12 ENCOUNTER — Encounter: Payer: Self-pay | Admitting: Rheumatology

## 2017-01-12 ENCOUNTER — Ambulatory Visit (INDEPENDENT_AMBULATORY_CARE_PROVIDER_SITE_OTHER): Payer: 59 | Admitting: Rheumatology

## 2017-01-12 ENCOUNTER — Encounter (INDEPENDENT_AMBULATORY_CARE_PROVIDER_SITE_OTHER): Payer: Self-pay

## 2017-01-12 ENCOUNTER — Ambulatory Visit (INDEPENDENT_AMBULATORY_CARE_PROVIDER_SITE_OTHER): Payer: 59

## 2017-01-12 ENCOUNTER — Ambulatory Visit: Payer: 59 | Admitting: Rheumatology

## 2017-01-12 VITALS — BP 103/65 | HR 63 | Ht 62.0 in | Wt 162.0 lb

## 2017-01-12 DIAGNOSIS — H15123 Nodular episcleritis, bilateral: Secondary | ICD-10-CM | POA: Diagnosis not present

## 2017-01-12 DIAGNOSIS — G8929 Other chronic pain: Secondary | ICD-10-CM

## 2017-01-12 DIAGNOSIS — M533 Sacrococcygeal disorders, not elsewhere classified: Secondary | ICD-10-CM

## 2017-01-12 DIAGNOSIS — Z9049 Acquired absence of other specified parts of digestive tract: Secondary | ICD-10-CM

## 2017-01-12 DIAGNOSIS — M170 Bilateral primary osteoarthritis of knee: Secondary | ICD-10-CM | POA: Diagnosis not present

## 2017-01-12 DIAGNOSIS — M19071 Primary osteoarthritis, right ankle and foot: Secondary | ICD-10-CM | POA: Diagnosis not present

## 2017-01-12 DIAGNOSIS — Z8673 Personal history of transient ischemic attack (TIA), and cerebral infarction without residual deficits: Secondary | ICD-10-CM

## 2017-01-12 DIAGNOSIS — M5136 Other intervertebral disc degeneration, lumbar region: Secondary | ICD-10-CM

## 2017-01-12 DIAGNOSIS — M19072 Primary osteoarthritis, left ankle and foot: Secondary | ICD-10-CM | POA: Diagnosis not present

## 2017-01-12 DIAGNOSIS — M51369 Other intervertebral disc degeneration, lumbar region without mention of lumbar back pain or lower extremity pain: Secondary | ICD-10-CM

## 2017-01-12 MED ORDER — TRIAMCINOLONE ACETONIDE 40 MG/ML IJ SUSP
40.0000 mg | INTRAMUSCULAR | Status: AC | PRN
  Administered 2017-01-12: 40 mg via INTRA_ARTICULAR

## 2017-01-12 MED ORDER — LIDOCAINE HCL 1 % IJ SOLN
1.0000 mL | INTRAMUSCULAR | Status: AC | PRN
  Administered 2017-01-12: 1 mL

## 2017-01-13 ENCOUNTER — Other Ambulatory Visit: Payer: Self-pay | Admitting: Rheumatology

## 2017-01-15 NOTE — Telephone Encounter (Signed)
Last Visit: 01/12/17 Next Visit: 07/17/17  Okay to refill per Dr. Estanislado Pandy

## 2017-02-16 ENCOUNTER — Other Ambulatory Visit: Payer: Self-pay | Admitting: Rheumatology

## 2017-02-16 NOTE — Telephone Encounter (Signed)
Last Visit: 01/12/17 Next Visit: 07/17/17  Okay to refill per Dr. Estanislado Pandy

## 2017-02-27 ENCOUNTER — Telehealth: Payer: Self-pay

## 2017-02-27 NOTE — Telephone Encounter (Signed)
[name] with [employer] wanted to let us know that PA for Euflexxa was denied.  Stated that patient would have to try preferred product such as Orthovisc or Synvisc and we should receive notification of denial.  Cb# is [phone].  Thank you.

## 2017-03-24 ENCOUNTER — Other Ambulatory Visit: Payer: Self-pay | Admitting: Rheumatology

## 2017-03-26 NOTE — Telephone Encounter (Signed)
Last Visit: 01/12/17 Next Visit: 07/17/17  Okay to refill per Dr. Estanislado Pandy

## 2017-03-27 ENCOUNTER — Telehealth: Payer: Self-pay | Admitting: Rheumatology

## 2017-03-27 NOTE — Telephone Encounter (Signed)
Patient states she received a denial letter from her insurance stating the gel injections were denied. Patient would like to know how to proceed from here. Patient questions if we are trying to get denial overturned, or need to proceed in another direction.

## 2017-04-29 ENCOUNTER — Other Ambulatory Visit: Payer: Self-pay | Admitting: Rheumatology

## 2017-06-21 NOTE — Telephone Encounter (Signed)
LMOM insurance terminated, need valid insurance information.

## 2017-06-22 ENCOUNTER — Telehealth: Payer: Self-pay | Admitting: Rheumatology

## 2017-06-22 NOTE — Telephone Encounter (Signed)
opened in error

## 2017-07-03 NOTE — Progress Notes (Signed)
Office Visit Note  Patient: [name]             Date of Birth: [birth year]           MRN: [id number]             PCP: Cyndi Bender, PA-C Referring: Cyndi Bender, PA-C Visit Date: 07/17/2017 Occupation: @GUAROCC @    Subjective:  Left SI joint pain    History of Present Illness: [name] is a 52 y.o. female with history of osteoarthritis and DDD.  Patient states that she has been having increased pain in her right knee.  She states that she is also having more stiffness and swelling in her right knee.  She denies any warmth or redness.  She is waiting to hear back so she can be scheduled for gel injections.  She states she continues to have chronic lower back pain and left SI joint pain.  She denies any radiation of pain.  She denies any swelling or pain in her feet at this time.  She states that her hands have been aching more often and she has noticed decreased grip strength.  She states that she has been taking out try anti-inflammatories on a daily basis.  She states she also takes baclofen at bedtime and Tylenol arthritis for pain relief.   Activities of Daily Living:  Patient reports morning stiffness for 10-15 minutes.   Patient Denies nocturnal pain.  Difficulty dressing/grooming: Denies Difficulty climbing stairs: Reports Difficulty getting out of chair: Reports Difficulty using hands for taps, buttons, cutlery, and/or writing: Reports   Review of Systems  Constitutional: Negative for fatigue.  HENT: Negative for mouth sores, mouth dryness and nose dryness.   Eyes: Negative for pain, visual disturbance and dryness.  Respiratory: Negative for cough, hemoptysis, shortness of breath and difficulty breathing.   Cardiovascular: Positive for swelling in legs/feet. Negative for chest pain, palpitations and hypertension.  Gastrointestinal: Negative for blood in stool, constipation and diarrhea.  Endocrine: Negative for increased urination.  Genitourinary: Negative  for painful urination.  Musculoskeletal: Positive for arthralgias, joint pain and morning stiffness. Negative for joint swelling, myalgias, muscle weakness, muscle tenderness and myalgias.  Skin: Negative for color change, pallor, rash, hair loss, nodules/bumps, skin tightness, ulcers and sensitivity to sunlight.  Allergic/Immunologic: Negative for susceptible to infections.  Neurological: Negative for dizziness, numbness, headaches and weakness.  Hematological: Negative for swollen glands.  Psychiatric/Behavioral: Negative for depressed mood and sleep disturbance. The patient is not nervous/anxious.     PMFS History:  Patient Active Problem List   Diagnosis Date Noted  . Osteoarthritis of both knees 01/19/2016  . Osteoarthritis of both feet 01/19/2016  . History of TIA (transient ischemic attack) 01/19/2016  . DDD (degenerative disc disease), lumbar 01/19/2016  . Nodular episcleritis of both eyes 01/19/2016  . Pain in right knee 01/19/2016  . Leiomyoma 10/12/2011  . Female pelvic pain 10/12/2011    Past Medical History:  Diagnosis Date  . DDD (degenerative disc disease), lumbar 01/19/2016  . History of TIA (transient ischemic attack) 01/19/2016  . Osteoarthritis of both feet 01/19/2016  . Osteoarthritis of both knees 01/19/2016  . Stroke [facility]) 5/12    History reviewed. No pertinent family history. Past Surgical History:  Procedure Laterality Date  . ABDOMINAL HYSTERECTOMY    . CHOLECYSTECTOMY    . KNEE ARTHROSCOPY     x2  . LAPAROSCOPIC ASSISTED VAGINAL HYSTERECTOMY  10/11/2011   Procedure: LAPAROSCOPIC ASSISTED VAGINAL HYSTERECTOMY;  Surgeon: Ralene Bathe  Matthew Saras, MD;  Location: [facility];  Service: Gynecology;  Laterality: N/A;  . LASIK     Social History   Social History Narrative  . Not on file     Objective: Vital Signs: BP 106/67 (BP Location: Left Arm, Patient Position: Sitting, Cuff Size: Normal)   Pulse 64   Resp 14   Ht 5\' 1"  (1.549 m)   Wt 163 lb (73.9 kg)   LMP  01/09/2011   BMI 30.80 kg/m    Physical Exam  Constitutional: She is oriented to person, place, and time. She appears well-developed and well-nourished.  HENT:  Head: Normocephalic and atraumatic.  Eyes: Conjunctivae and EOM are normal.  Neck: Normal range of motion.  Cardiovascular: Normal rate, regular rhythm, normal heart sounds and intact distal pulses.  Pulmonary/Chest: Effort normal and breath sounds normal.  Abdominal: Soft. Bowel sounds are normal.  Lymphadenopathy:    She has no cervical adenopathy.  Neurological: She is alert and oriented to person, place, and time.  Skin: Skin is warm and dry. Capillary refill takes less than 2 seconds.  Psychiatric: She has a normal mood and affect. Her behavior is normal.  Nursing note and vitals reviewed.    Musculoskeletal Exam: C-spine, thoracic spine, lumbar spine good range of motion.  No midline spinal tenderness.  Left SI joint tenderness.  Shoulder joints, elbow joints, wrist joints, MCPs, PIPs, DIPs good range of motion with no synovitis.  Mild DIP synovial thickening consistent with osteoarthritis.  Hip joints, knee joints, ankle joints, MTPs, PIPs, DIPs good range of motion no synovitis.  She has mild warmth of her right knee.  She has bilateral knee crepitus.  No tenderness of trochanteric bursa bilaterally.  CDAI Exam: No CDAI exam completed.    Investigation: No additional findings. CBC Latest Ref Rng & Units 07/13/2016 10/12/2011 10/04/2011  WBC 3.8 - 10.8 K/uL 5.4 11.7(H) 5.8  Hemoglobin 11.7 - 15.5 g/dL 13.2 11.2(L) 12.9  Hematocrit 35.0 - 45.0 % 39.8 34.0(L) 39.0  Platelets 140 - 400 K/uL 302 244 277   CMP Latest Ref Rng & Units 07/13/2016 08/03/2010  Glucose 65 - 99 mg/dL 84 102(H)  BUN 7 - 25 mg/dL 11 8  Creatinine 0.50 - 1.10 mg/dL 0.61 0.57  Sodium 135 - 146 mmol/L 139 138  Potassium 3.5 - 5.3 mmol/L 4.9 3.4(L)  Chloride 98 - 110 mmol/L 104 106  CO2 20 - 31 mmol/L 25 23  Calcium 8.6 - 10.2 mg/dL 9.2 9.2  Total  Protein 6.1 - 8.1 g/dL 6.9 6.9  Total Bilirubin 0.2 - 1.2 mg/dL 0.8 0.8  Alkaline Phos 33 - 115 U/L 50 59  AST 10 - 35 U/L 15 15  ALT 6 - 29 U/L 13 14    Imaging: No results found.  Speciality Comments: No specialty comments available.    Procedures:  Sacroiliac Joint Inj on 07/17/2017 8:40 AM Indications: pain Details: 27 G 1.5 in needle, posterior approach Medications: 1 mL lidocaine 1 %; 40 mg triamcinolone acetonide 40 MG/ML Aspirate: 0 mL Outcome: tolerated well, no immediate complications Procedure, treatment alternatives, risks and benefits explained, specific risks discussed. Consent was given by the patient. Immediately prior to procedure a time out was called to verify the correct patient, procedure, equipment, support staff and site/side marked as required. Patient was prepped and draped in the usual sterile fashion.     Allergies: Celebrex [celecoxib]   Assessment / Plan:     Visit Diagnoses: Primary osteoarthritis of both knees: She has  discomfort and warmth of her right knee.  She has a mild flexion contracture of her right knee she has bilateral knee crepitus.  We are waiting for approval of her Visco supplement injections.  When they are approved we will schedule her for these injections.  I handout on knee exercises were provided to the patient.  She is advised to ice and elevate her right knee to decrease the inflammation.  Primary osteoarthritis of both feet: No synovitis noted.  No discomfort at this time.  DDD (degenerative disc disease), lumbar: Chronic pain.  No midline spinal tenderness.  She has good range of motion on exam.  She takes baclofen at bedtime as needed for muscle spasms.  A refill of baclofen was sent to the pharmacy.  Chronic left SI joint pain: She has tenderness of left SI joint.  She requested a cortisone injection today in the office.  Her blood pressure is well controlled in the office.  She was advised to monitor her blood pressure closely  following the cortisone injection today.  Side effects were discussed.  She tolerated the procedure well.  Nodular episcleritis of both eyes: No recent flares.  Other medical conditions listed as follows:  History of TIA (transient ischemic attack)    Orders: Orders Placed This Encounter  Procedures  . Sacroiliac Joint Inj   Meds ordered this encounter  Medications  . baclofen (LIORESAL) 10 MG tablet    Sig: Take 1 tablet (10 mg total) by mouth daily as needed for muscle spasms.    Dispense:  30 each    Refill:  2    Face-to-face time spent with patient was 30 minutes.> 50% of time was spent in counseling and coordination of care.  Follow-Up Instructions: Return in about 6 months (around 01/16/2018) for Osteoarthritis.   Ofilia Neas, PA-C  Note - This record has been created using Dragon software.  Chart creation errors have been sought, but may not always  have been located. Such creation errors do not reflect on  the standard of medical care.

## 2017-07-17 ENCOUNTER — Encounter: Payer: Self-pay | Admitting: Physician Assistant

## 2017-07-17 ENCOUNTER — Ambulatory Visit: Payer: 59 | Admitting: Physician Assistant

## 2017-07-17 VITALS — BP 106/67 | HR 64 | Resp 14 | Ht 61.0 in | Wt 163.0 lb

## 2017-07-17 DIAGNOSIS — M19071 Primary osteoarthritis, right ankle and foot: Secondary | ICD-10-CM

## 2017-07-17 DIAGNOSIS — M533 Sacrococcygeal disorders, not elsewhere classified: Secondary | ICD-10-CM

## 2017-07-17 DIAGNOSIS — M19072 Primary osteoarthritis, left ankle and foot: Secondary | ICD-10-CM

## 2017-07-17 DIAGNOSIS — M170 Bilateral primary osteoarthritis of knee: Secondary | ICD-10-CM | POA: Diagnosis not present

## 2017-07-17 DIAGNOSIS — Z8673 Personal history of transient ischemic attack (TIA), and cerebral infarction without residual deficits: Secondary | ICD-10-CM

## 2017-07-17 DIAGNOSIS — H15123 Nodular episcleritis, bilateral: Secondary | ICD-10-CM

## 2017-07-17 DIAGNOSIS — G8929 Other chronic pain: Secondary | ICD-10-CM | POA: Diagnosis not present

## 2017-07-17 DIAGNOSIS — M5136 Other intervertebral disc degeneration, lumbar region: Secondary | ICD-10-CM

## 2017-07-17 DIAGNOSIS — M51369 Other intervertebral disc degeneration, lumbar region without mention of lumbar back pain or lower extremity pain: Secondary | ICD-10-CM

## 2017-07-17 MED ORDER — TRIAMCINOLONE ACETONIDE 40 MG/ML IJ SUSP
40.0000 mg | INTRAMUSCULAR | Status: AC | PRN
  Administered 2017-07-17: 40 mg via INTRA_ARTICULAR

## 2017-07-17 MED ORDER — BACLOFEN 10 MG PO TABS: 10.0000 mg | ORAL_TABLET | Freq: Every day | ORAL | 2 refills | Status: DC | PRN

## 2017-07-17 MED ORDER — LIDOCAINE HCL 1 % IJ SOLN
1.0000 mL | INTRAMUSCULAR | Status: AC | PRN
  Administered 2017-07-17: 1 mL

## 2017-07-17 NOTE — Progress Notes (Deleted)
   Office Visit Note  Patient: [name]             Date of Birth: 08/20/1965           MRN: [id number]             PCP: Cyndi Bender, PA-C Visit Date: 07/17/2017   Assessment: Visit Diagnoses: Primary osteoarthritis of both knees  Primary osteoarthritis of both feet  DDD (degenerative disc disease), lumbar  Chronic left SI joint pain  Nodular episcleritis of both eyes  History of TIA (transient ischemic attack)   Follow-Up Instructions: No follow-ups on file.  Orders: No orders of the defined types were placed in this encounter.  No orders of the defined types were placed in this encounter.    Subjective:    Allergies: Celebrex [celecoxib]   Activities of Daily Living: ***   History of Present Illness: [name] is a 52 y.o. female ***   Review of Systems  Constitutional: Negative for fatigue.  HENT: Negative for trouble swallowing and trouble swallowing.   Eyes: Negative for pain.  Respiratory: Negative for shortness of breath.   Cardiovascular: Positive for swelling in legs/feet.  Gastrointestinal: Negative for constipation.  Endocrine: Negative for cold intolerance.  Genitourinary: Negative for difficulty urinating.  Musculoskeletal: Positive for arthralgias, joint pain, joint swelling, muscle weakness, morning stiffness and muscle tenderness.  Skin: Negative for rash.  Neurological: Negative for numbness.  Hematological: Negative for bruising/bleeding tendency.  Psychiatric/Behavioral: Negative for sleep disturbance.     Investigation: No additional findings.   Objective: Vital Signs: BP 106/67 (BP Location: Left Arm, Patient Position: Sitting, Cuff Size: Normal)   Pulse 64   Resp 14   Ht 5\' 1"  (1.549 m)   Wt 163 lb (73.9 kg)   LMP 01/09/2011   BMI 30.80 kg/m    Physical Exam   Musculoskeletal Exam: ***  CDAI Exam: No CDAI exam completed.  CDAI comments:  Speciality Comments: No specialty comments available.  Imaging: No  results found.   PMFS History:  Patient Active Problem List   Diagnosis Date Noted  . Osteoarthritis of both knees 01/19/2016  . Osteoarthritis of both feet 01/19/2016  . History of TIA (transient ischemic attack) 01/19/2016  . DDD (degenerative disc disease), lumbar 01/19/2016  . Nodular episcleritis of both eyes 01/19/2016  . Pain in right knee 01/19/2016  . Leiomyoma 10/12/2011  . Female pelvic pain 10/12/2011    Past Medical History:  Diagnosis Date  . DDD (degenerative disc disease), lumbar 01/19/2016  . History of TIA (transient ischemic attack) 01/19/2016  . Osteoarthritis of both feet 01/19/2016  . Osteoarthritis of both knees 01/19/2016  . Stroke [facility]) 5/12    History reviewed. No pertinent family history. Past Surgical History:  Procedure Laterality Date  . ABDOMINAL HYSTERECTOMY    . CHOLECYSTECTOMY    . KNEE ARTHROSCOPY     x2  . LAPAROSCOPIC ASSISTED VAGINAL HYSTERECTOMY  10/11/2011   Procedure: LAPAROSCOPIC ASSISTED VAGINAL HYSTERECTOMY;  Surgeon: Margarette Asal, MD;  Location: [facility];  Service: Gynecology;  Laterality: N/A;  . LASIK     Social History   Social History Narrative  . Not on file     Procedures:  No procedures performed  Gerlean Ren, RT  Note - This record has been created using [employer]. Chart creation errors have been sought, but may not always have been located. Such creation errors do not reflect on the standard of medical care.

## 2017-07-17 NOTE — Patient Instructions (Signed)

## 2017-08-21 ENCOUNTER — Telehealth (INDEPENDENT_AMBULATORY_CARE_PROVIDER_SITE_OTHER): Payer: Self-pay

## 2017-08-21 NOTE — Telephone Encounter (Signed)
Submitted application online for Monovisc injection, right knee. 

## 2017-08-31 ENCOUNTER — Telehealth (INDEPENDENT_AMBULATORY_CARE_PROVIDER_SITE_OTHER): Payer: Self-pay

## 2017-08-31 NOTE — Telephone Encounter (Signed)
Talked with Genelle Bal [employer] and provided her with the correct insurance information for patient.  Stated taht she will provide information to Case manager in charge of case.

## 2017-09-12 ENCOUNTER — Encounter (INDEPENDENT_AMBULATORY_CARE_PROVIDER_SITE_OTHER): Payer: Self-pay | Admitting: Radiology

## 2017-09-12 NOTE — Progress Notes (Unsigned)
Can you please call patient and sched appt when patient wants to, for Monovisc injection right knee?  Insurance covers at 100%, after a $50 copay. Thanks-

## 2017-09-18 NOTE — Progress Notes (Signed)
LMOM for patient to call and schedule Monovisc injection (right knee).

## 2017-09-24 ENCOUNTER — Encounter (INDEPENDENT_AMBULATORY_CARE_PROVIDER_SITE_OTHER): Payer: Self-pay

## 2017-09-24 ENCOUNTER — Ambulatory Visit: Payer: 59 | Admitting: Physician Assistant

## 2017-09-24 DIAGNOSIS — M1711 Unilateral primary osteoarthritis, right knee: Secondary | ICD-10-CM | POA: Diagnosis not present

## 2017-09-24 DIAGNOSIS — M170 Bilateral primary osteoarthritis of knee: Secondary | ICD-10-CM | POA: Insufficient documentation

## 2017-09-24 MED ORDER — LIDOCAINE HCL 1 % IJ SOLN
1.5000 mL | INTRAMUSCULAR | Status: AC | PRN
  Administered 2017-09-24: 1.5 mL

## 2017-09-24 MED ORDER — HYALURONAN 88 MG/4ML IX SOSY
88.0000 mg | PREFILLED_SYRINGE | INTRA_ARTICULAR | Status: AC | PRN
  Administered 2017-09-24: 88 mg via INTRA_ARTICULAR

## 2017-09-24 NOTE — Progress Notes (Signed)
   Procedure Note  Patient: [name]             Date of Birth: 1965-05-29           MRN: [id number]             Visit Date: 09/24/2017  Procedures: Visit Diagnoses: Unilateral primary osteoarthritis, right knee Monovisc #1 B/B right knee  Large Joint Inj: R knee on 09/24/2017 8:07 AM Indications: pain Details: 22 G 1.5 in needle, medial approach  Arthrogram: No  Medications: 88 mg Hyaluronan 88 MG/4ML; 1.5 mL lidocaine 1 % Aspirate: 0 mL Outcome: tolerated well, no immediate complications Procedure, treatment alternatives, risks and benefits explained, specific risks discussed. Consent was given by the patient. Immediately prior to procedure a time out was called to verify the correct patient, procedure, equipment, support staff and site/side marked as required. Patient was prepped and draped in the usual sterile fashion.     Patient tolerated the procedure well.   Hazel Sams, PA-C

## 2017-10-05 ENCOUNTER — Other Ambulatory Visit: Payer: Self-pay | Admitting: Physician Assistant

## 2017-11-02 ENCOUNTER — Other Ambulatory Visit: Payer: Self-pay | Admitting: Physician Assistant

## 2017-11-02 NOTE — Telephone Encounter (Signed)
Last visit: 07/17/2017 Next visit: 01/16/2018  Okay to refill per Dr. Estanislado Pandy.

## 2017-11-13 DIAGNOSIS — G459 Transient cerebral ischemic attack, unspecified: Secondary | ICD-10-CM | POA: Diagnosis not present

## 2017-11-13 DIAGNOSIS — I639 Cerebral infarction, unspecified: Secondary | ICD-10-CM | POA: Diagnosis not present

## 2017-11-13 DIAGNOSIS — E785 Hyperlipidemia, unspecified: Secondary | ICD-10-CM | POA: Diagnosis not present

## 2017-11-13 DIAGNOSIS — R29810 Facial weakness: Secondary | ICD-10-CM | POA: Diagnosis not present

## 2017-11-13 DIAGNOSIS — R5381 Other malaise: Secondary | ICD-10-CM | POA: Diagnosis not present

## 2017-11-14 DIAGNOSIS — R5381 Other malaise: Secondary | ICD-10-CM | POA: Diagnosis not present

## 2017-11-14 DIAGNOSIS — E785 Hyperlipidemia, unspecified: Secondary | ICD-10-CM | POA: Diagnosis not present

## 2017-11-14 DIAGNOSIS — G459 Transient cerebral ischemic attack, unspecified: Secondary | ICD-10-CM | POA: Diagnosis not present

## 2017-11-26 DIAGNOSIS — E559 Vitamin D deficiency, unspecified: Secondary | ICD-10-CM | POA: Diagnosis not present

## 2017-11-26 DIAGNOSIS — R5383 Other fatigue: Secondary | ICD-10-CM | POA: Diagnosis not present

## 2017-11-26 DIAGNOSIS — Z0389 Encounter for observation for other suspected diseases and conditions ruled out: Secondary | ICD-10-CM | POA: Diagnosis not present

## 2017-11-26 DIAGNOSIS — Z0001 Encounter for general adult medical examination with abnormal findings: Secondary | ICD-10-CM | POA: Diagnosis not present

## 2017-11-26 DIAGNOSIS — E785 Hyperlipidemia, unspecified: Secondary | ICD-10-CM | POA: Diagnosis not present

## 2017-11-26 DIAGNOSIS — G459 Transient cerebral ischemic attack, unspecified: Secondary | ICD-10-CM | POA: Diagnosis not present

## 2017-11-30 ENCOUNTER — Other Ambulatory Visit: Payer: Self-pay | Admitting: Rheumatology

## 2017-11-30 NOTE — Telephone Encounter (Signed)
Last visit: 07/17/2017 Next visit: 01/16/2018  Okay to refill per Dr. Estanislado Pandy

## 2017-12-11 ENCOUNTER — Encounter: Payer: Self-pay | Admitting: Cardiovascular Disease

## 2017-12-11 ENCOUNTER — Ambulatory Visit: Payer: 59 | Admitting: Cardiovascular Disease

## 2017-12-11 ENCOUNTER — Encounter: Payer: Self-pay | Admitting: Internal Medicine

## 2017-12-11 ENCOUNTER — Ambulatory Visit (INDEPENDENT_AMBULATORY_CARE_PROVIDER_SITE_OTHER): Payer: 59

## 2017-12-11 VITALS — BP 100/60 | HR 49 | Ht 62.0 in | Wt 165.0 lb

## 2017-12-11 DIAGNOSIS — R001 Bradycardia, unspecified: Secondary | ICD-10-CM | POA: Diagnosis not present

## 2017-12-11 DIAGNOSIS — G459 Transient cerebral ischemic attack, unspecified: Secondary | ICD-10-CM

## 2017-12-11 DIAGNOSIS — R9431 Abnormal electrocardiogram [ECG] [EKG]: Secondary | ICD-10-CM | POA: Diagnosis not present

## 2017-12-11 DIAGNOSIS — R0789 Other chest pain: Secondary | ICD-10-CM | POA: Diagnosis not present

## 2017-12-11 NOTE — Progress Notes (Addendum)
CARDIOLOGY CONSULT NOTE  Patient ID: [name] MRN: [id number] DOB/AGE: Jul 24, 1965 52 y.o.  Admit date: (Not on file) Primary Physician: Doree Albee, MD Referring Physician: Doree Albee, MD  Reason for Consultation: Bradycardia, abnormal ECG  HPI: [name] is a 52 y.o. female who is being seen today for the evaluation of bradycardia and abnormal ECG at the request of Doree Albee, MD.   I reviewed records from her PCP.  Past medical history includes degenerative joint disease with diffuse arthritis of the feet, knees, hands, and lower back.  She has a history of a TIA and is on aspirin and pravastatin.  She tried Lipitor but was unable to tolerate it.  Her ECG apparently showed sinus bradycardia with a heart rate of 45 bpm and Q waves.  During her office visit she complained of fatigue.  I reviewed labs performed on 11/26/2017: Hemoglobin 12.2, white blood cell 7.2, platelets 252, total cholesterol 98, HDL 55, triglycerides 37, LDL 32, BUN 10, creatinine 0.73, sodium 140, potassium 4.9, normal liver transaminases, hemoglobin A1c 5%.  Thyroid function was normal.  I personally reviewed the ECG which demonstrated sinus bradycardia, 45 bpm, with Q waves inferiorly.  There were no ST segment or T wave abnormalities.  Heart rate in our office today is 49 bpm.  She is here with her husband.  She was apparently hospitalized in late August at [facility] for a TIA.  It appears multiple studies were done including an echocardiogram, carotid Dopplers, and neuro imaging.  I do not have a copy of hospitalization records and will have to request them.  Her husband tells me her heart rate ranged between 54-57 bpm.  The patient says she has occasional palpitations.  Afterwards she feels fatigued and a friend of hers told her she looked pale.  She has occasional retrosternal chest tightness which can occur both at rest and with exertion.  She denies exertional  dyspnea, orthopnea, and paroxysmal nocturnal dyspnea.  She has occasional lightheadedness and dizziness which has been going on for several months.  Palpitations have been occurring about once every 1 to 2 weeks for the past 2 months.  She does have occasional bilateral ankle swelling.  Her husband says she snores.  She denies morning headaches.  She has no prior diagnosis of sleep apnea.  She denies syncope.   Allergies  Allergen Reactions  . Celebrex [Celecoxib] Swelling    Facial swelling    Current Outpatient Medications  Medication Sig Dispense Refill  . aspirin 325 MG EC tablet Take 325 mg by mouth daily.    . baclofen (LIORESAL) 10 MG tablet TAKE 1 TABLET BY MOUTH EVERY DAY AS NEEDED FOR MUSCLE SPASMS 30 tablet 0  . cholecalciferol (VITAMIN D) 1000 units tablet Take 1,000 Units by mouth daily.    . fluticasone (FLONASE) 50 MCG/ACT nasal spray Place 2 sprays into both nostrils daily.    . Misc Natural Products (TART CHERRY ADVANCED PO) Take by mouth.    Marland Kitchen MISC NATURAL PRODUCTS PO Take 1 capsule by mouth daily. Turmeric    . pravastatin (PRAVACHOL) 10 MG tablet TK 1 T PO D IN THE EVE  0   No current facility-administered medications for this visit.     Past Medical History:  Diagnosis Date  . DDD (degenerative disc disease), lumbar 01/19/2016  . History of TIA (transient ischemic attack) 01/19/2016  . Osteoarthritis of both feet 01/19/2016  . Osteoarthritis of  both knees 01/19/2016  . Stroke [facility]) 5/12    Past Surgical History:  Procedure Laterality Date  . ABDOMINAL HYSTERECTOMY    . CHOLECYSTECTOMY    . KNEE ARTHROSCOPY     x2  . LAPAROSCOPIC ASSISTED VAGINAL HYSTERECTOMY  10/11/2011   Procedure: LAPAROSCOPIC ASSISTED VAGINAL HYSTERECTOMY;  Surgeon: Margarette Asal, MD;  Location: [place] [facility];  Service: Gynecology;  Laterality: N/A;  . LASIK      Social History   Socioeconomic History  . Marital status: Divorced    Spouse name: Not on file  . Number of children: Not on  file  . Years of education: Not on file  . Highest education level: Not on file  Occupational History  . Not on file  Social Needs  . Financial resource strain: Not on file  . Food insecurity:    Worry: Not on file    Inability: Not on file  . Transportation needs:    Medical: Not on file    Non-medical: Not on file  Tobacco Use  . Smoking status: Never Smoker  . Smokeless tobacco: Never Used  Substance and Sexual Activity  . Alcohol use: Yes    Comment: occasionally  . Drug use: No  . Sexual activity: Not on file  Lifestyle  . Physical activity:    Days per week: Not on file    Minutes per session: Not on file  . Stress: Not on file  Relationships  . Social connections:    Talks on phone: Not on file    Gets together: Not on file    Attends religious service: Not on file    Active member of club or organization: Not on file    Attends meetings of clubs or organizations: Not on file    Relationship status: Not on file  . Intimate partner violence:    Fear of current or ex partner: Not on file    Emotionally abused: Not on file    Physically abused: Not on file    Forced sexual activity: Not on file  Other Topics Concern  . Not on file  Social History Narrative  . Not on file     No family history of premature CAD in 1st degree relatives.  Current Meds  Medication Sig  . aspirin 325 MG EC tablet Take 325 mg by mouth daily.  . baclofen (LIORESAL) 10 MG tablet TAKE 1 TABLET BY MOUTH EVERY DAY AS NEEDED FOR MUSCLE SPASMS  . cholecalciferol (VITAMIN D) 1000 units tablet Take 1,000 Units by mouth daily.  . fluticasone (FLONASE) 50 MCG/ACT nasal spray Place 2 sprays into both nostrils daily.  . Misc Natural Products (TART CHERRY ADVANCED PO) Take by mouth.  Marland Kitchen MISC NATURAL PRODUCTS PO Take 1 capsule by mouth daily. Turmeric  . pravastatin (PRAVACHOL) 10 MG tablet TK 1 T PO D IN THE EVE      Review of systems complete and found to be negative unless listed above in  HPI    Physical exam Blood pressure 100/60, pulse (!) 49, height 5\' 2"  (1.575 m), weight 165 lb (74.8 kg), last menstrual period 01/09/2011, SpO2 98 %. General: NAD Neck: No JVD, no thyromegaly or thyroid nodule.  Lungs: Clear to auscultation bilaterally with normal respiratory effort. CV: Nondisplaced PMI.  Bradycardic, regular rhythm, normal S1/S2, no S3/S4, no murmur.  No peripheral edema.  No carotid bruit.    Abdomen: Soft, nontender, no distention.  Skin: Intact without lesions or rashes.  Neurologic: Alert  and oriented x 3.  Psych: Normal affect. Extremities: No clubbing or cyanosis.  HEENT: Normal.   ECG: Most recent ECG reviewed.   Labs: Lab Results  Component Value Date/Time   K 4.9 07/13/2016 09:18 AM   BUN 11 07/13/2016 09:18 AM   CREATININE 0.61 07/13/2016 09:18 AM   ALT 13 07/13/2016 09:18 AM   HGB 13.2 07/13/2016 09:18 AM     Lipids: No results found for: LDLCALC, LDLDIRECT, CHOL, TRIG, HDL      ASSESSMENT AND PLAN:  1.  Bradycardia with dizziness and fatigue: There were nonspecific Q waves inferiorly.  Thyroid function is normal.  She is not on any AV nodal blocking agents to explain bradycardia.  I will obtain a 30-day event monitor to evaluate for symptomatic bradycardia and any potential sinus pauses.  I will also obtain records from [facility] including echocardiogram and other studies. She does have a history of snoring and I would not rule out the possibility of a sleep study as sleep apnea can also cause bradycardia. Chronotropic incompetence also has to be considered in the differential.  2.  Recent TIA: Currently on aspirin 325 mg and pravastatin 10 mg.  3.  Abnormal ECG with chest tightness: Symptoms are atypical in that they can occur both at rest and with exertion.  There are nonspecific Q waves inferiorly.  I will obtain a copy of the echocardiogram from [facility].  I will obtain an exercise tolerance test.    Disposition: Follow up  in December 2019  Signed: Kate Sable, M.D., F.A.C.C.  12/11/2017, 3:38 PM

## 2017-12-11 NOTE — Patient Instructions (Signed)
Your physician recommends that you schedule a follow-up appointment in: 3 months with [place]    Your physician has requested that you have an exercise tolerance test. For further information please visit [email or web address]. Please also follow instruction sheet, as given.    Your physician has recommended that you wear an event monitor for 30 days. Event monitors are medical devices that record the heart's electrical activity. Doctors most often [place] these monitors to diagnose arrhythmias. Arrhythmias are problems with the speed or rhythm of the heartbeat. The monitor is a small, portable device. You can wear one while you do your normal daily activities. This is usually used to diagnose what is causing palpitations/syncope (passing out).   Your physician recommends that you continue on your current medications as directed. Please refer to the Current Medication list given to you today.    If you need a refill on your cardiac medications before your next appointment, please call your pharmacy.

## 2017-12-12 DIAGNOSIS — Z6829 Body mass index (BMI) 29.0-29.9, adult: Secondary | ICD-10-CM | POA: Diagnosis not present

## 2017-12-12 DIAGNOSIS — Z01419 Encounter for gynecological examination (general) (routine) without abnormal findings: Secondary | ICD-10-CM | POA: Diagnosis not present

## 2017-12-17 ENCOUNTER — Ambulatory Visit (HOSPITAL_COMMUNITY)
Admission: RE | Admit: 2017-12-17 | Discharge: 2017-12-17 | Disposition: A | Discharge status: Home / Self Care | Payer: 59 | Source: Ambulatory Visit | Attending: Cardiovascular Disease | Admitting: Cardiovascular Disease

## 2017-12-17 DIAGNOSIS — R001 Bradycardia, unspecified: Secondary | ICD-10-CM | POA: Diagnosis present

## 2017-12-17 DIAGNOSIS — R9431 Abnormal electrocardiogram [ECG] [EKG]: Secondary | ICD-10-CM | POA: Diagnosis not present

## 2017-12-17 LAB — EXERCISE TOLERANCE TEST
CHL CUP MPHR: 170 {beats}/min
Estimated workload: 8 METS
Exercise duration (min): 6 min
Exercise duration (sec): 20 s
Peak HR: 166 {beats}/min
Percent HR: 97 %
RPE: 17
Rest HR: 47 {beats}/min

## 2017-12-18 ENCOUNTER — Telehealth: Payer: Self-pay

## 2017-12-18 NOTE — Telephone Encounter (Signed)
Called pt, No answer. Left message for pt to return call.  

## 2017-12-18 NOTE — Telephone Encounter (Signed)
-----   Message from Herminio Commons, MD sent at 12/18/2017  8:35 AM EDT ----- Normal stress test.  Heart rate response was normal with exercise.  No evidence for blockages.  I will await event monitor.

## 2017-12-21 ENCOUNTER — Other Ambulatory Visit: Payer: Self-pay | Admitting: Rheumatology

## 2017-12-21 NOTE — Telephone Encounter (Signed)
Last visit: 07/17/2017 Next visit: 01/16/2018  Okay to refill per Dr. Estanislado Pandy

## 2018-01-02 NOTE — Progress Notes (Deleted)
Office Visit Note  Patient: [name]             Date of Birth: 01-20-1966           MRN: [id number]             PCP: Doree Albee, MD Referring: Cyndi Bender, PA-C Visit Date: 01/16/2018 Occupation: @GUAROCC @  Subjective:  No chief complaint on file.   History of Present Illness: [name] is a 52 y.o. female ***   Activities of Daily Living:  Patient reports morning stiffness for *** {minute/hour:19697}.   Patient {ACTIONS;DENIES/REPORTS:21021675::"Denies"} nocturnal pain.  Difficulty dressing/grooming: {ACTIONS;DENIES/REPORTS:21021675::"Denies"} Difficulty climbing stairs: {ACTIONS;DENIES/REPORTS:21021675::"Denies"} Difficulty getting out of chair: {ACTIONS;DENIES/REPORTS:21021675::"Denies"} Difficulty using hands for taps, buttons, cutlery, and/or writing: {ACTIONS;DENIES/REPORTS:21021675::"Denies"}  No Rheumatology ROS completed.   PMFS History:  Patient Active Problem List   Diagnosis Date Noted  . Primary osteoarthritis of both knees 09/24/2017  . Osteoarthritis of both knees 01/19/2016  . Osteoarthritis of both feet 01/19/2016  . History of TIA (transient ischemic attack) 01/19/2016  . DDD (degenerative disc disease), lumbar 01/19/2016  . Nodular episcleritis of both eyes 01/19/2016  . Pain in right knee 01/19/2016  . Leiomyoma 10/12/2011  . Female pelvic pain 10/12/2011    Past Medical History:  Diagnosis Date  . DDD (degenerative disc disease), lumbar 01/19/2016  . History of TIA (transient ischemic attack) 01/19/2016  . Osteoarthritis of both feet 01/19/2016  . Osteoarthritis of both knees 01/19/2016  . Stroke [facility]) 5/12    No family history on file. Past Surgical History:  Procedure Laterality Date  . ABDOMINAL HYSTERECTOMY    . CHOLECYSTECTOMY    . KNEE ARTHROSCOPY     x2  . LAPAROSCOPIC ASSISTED VAGINAL HYSTERECTOMY  10/11/2011   Procedure: LAPAROSCOPIC ASSISTED VAGINAL HYSTERECTOMY;  Surgeon: Margarette Asal, MD;  Location: [place]  [facility];  Service: Gynecology;  Laterality: N/A;  . LASIK     Social History   Social History Narrative  . Not on file    Objective: Vital Signs: LMP 01/09/2011    Physical Exam   Musculoskeletal Exam: ***  CDAI Exam: CDAI Score: Not documented Patient Global Assessment: Not documented; Provider Global Assessment: Not documented Swollen: Not documented; Tender: Not documented Joint Exam   Not documented   There is currently no information documented on the homunculus. Go to the Rheumatology activity and complete the homunculus joint exam.  Investigation: No additional findings.  Imaging: No results found.  Recent Labs: Lab Results  Component Value Date   WBC 5.4 07/13/2016   HGB 13.2 07/13/2016   PLT 302 07/13/2016   NA 139 07/13/2016   K 4.9 07/13/2016   CL 104 07/13/2016   CO2 25 07/13/2016   GLUCOSE 84 07/13/2016   BUN 11 07/13/2016   CREATININE 0.61 07/13/2016   BILITOT 0.8 07/13/2016   ALKPHOS 50 07/13/2016   AST 15 07/13/2016   ALT 13 07/13/2016   PROT 6.9 07/13/2016   ALBUMIN 3.8 07/13/2016   CALCIUM 9.2 07/13/2016   GFRAA >89 07/13/2016    Speciality Comments: No specialty comments available.  Procedures:  No procedures performed Allergies: Celebrex [celecoxib]   Assessment / Plan:     Visit Diagnoses: No diagnosis found.   Orders: No orders of the defined types were placed in this encounter.  No orders of the defined types were placed in this encounter.   Face-to-face time spent with patient was *** minutes. Greater than 50% of time was spent in counseling and  coordination of care.  Follow-Up Instructions: No follow-ups on file.   Earnestine Mealing, CMA  Note - This record has been created using Editor, commissioning.  Chart creation errors have been sought, but may not always  have been located. Such creation errors do not reflect on  the standard of medical care.

## 2018-01-03 DIAGNOSIS — R569 Unspecified convulsions: Secondary | ICD-10-CM | POA: Diagnosis not present

## 2018-01-03 DIAGNOSIS — R0600 Dyspnea, unspecified: Secondary | ICD-10-CM | POA: Diagnosis not present

## 2018-01-03 DIAGNOSIS — E785 Hyperlipidemia, unspecified: Secondary | ICD-10-CM | POA: Diagnosis not present

## 2018-01-03 DIAGNOSIS — R109 Unspecified abdominal pain: Secondary | ICD-10-CM | POA: Diagnosis not present

## 2018-01-03 DIAGNOSIS — R0789 Other chest pain: Secondary | ICD-10-CM | POA: Diagnosis not present

## 2018-01-15 NOTE — Progress Notes (Deleted)
Office Visit Note  Patient: [name]             Date of Birth: 1965/05/07           MRN: [id number]             PCP: Doree Albee, MD Referring: Cyndi Bender, PA-C Visit Date: 01/17/2018 Occupation: @GUAROCC @  Subjective:  No chief complaint on file.   History of Present Illness: [name] is a 52 y.o. female ***   Activities of Daily Living:  Patient reports morning stiffness for *** {minute/hour:19697}.   Patient {ACTIONS;DENIES/REPORTS:21021675::"Denies"} nocturnal pain.  Difficulty dressing/grooming: {ACTIONS;DENIES/REPORTS:21021675::"Denies"} Difficulty climbing stairs: {ACTIONS;DENIES/REPORTS:21021675::"Denies"} Difficulty getting out of chair: {ACTIONS;DENIES/REPORTS:21021675::"Denies"} Difficulty using hands for taps, buttons, cutlery, and/or writing: {ACTIONS;DENIES/REPORTS:21021675::"Denies"}  No Rheumatology ROS completed.   PMFS History:  Patient Active Problem List   Diagnosis Date Noted  . Primary osteoarthritis of both knees 09/24/2017  . Osteoarthritis of both knees 01/19/2016  . Osteoarthritis of both feet 01/19/2016  . History of TIA (transient ischemic attack) 01/19/2016  . DDD (degenerative disc disease), lumbar 01/19/2016  . Nodular episcleritis of both eyes 01/19/2016  . Pain in right knee 01/19/2016  . Leiomyoma 10/12/2011  . Female pelvic pain 10/12/2011    Past Medical History:  Diagnosis Date  . DDD (degenerative disc disease), lumbar 01/19/2016  . History of TIA (transient ischemic attack) 01/19/2016  . Osteoarthritis of both feet 01/19/2016  . Osteoarthritis of both knees 01/19/2016  . Stroke [facility]) 5/12    No family history on file. Past Surgical History:  Procedure Laterality Date  . ABDOMINAL HYSTERECTOMY    . CHOLECYSTECTOMY    . KNEE ARTHROSCOPY     x2  . LAPAROSCOPIC ASSISTED VAGINAL HYSTERECTOMY  10/11/2011   Procedure: LAPAROSCOPIC ASSISTED VAGINAL HYSTERECTOMY;  Surgeon: Margarette Asal, MD;  Location: [place]  [facility];  Service: Gynecology;  Laterality: N/A;  . LASIK     Social History   Social History Narrative  . Not on file    Objective: Vital Signs: LMP 01/09/2011    Physical Exam   Musculoskeletal Exam: ***  CDAI Exam: CDAI Score: Not documented Patient Global Assessment: Not documented; Provider Global Assessment: Not documented Swollen: Not documented; Tender: Not documented Joint Exam   Not documented   There is currently no information documented on the homunculus. Go to the Rheumatology activity and complete the homunculus joint exam.  Investigation: No additional findings.  Imaging: No results found.  Recent Labs: Lab Results  Component Value Date   WBC 5.4 07/13/2016   HGB 13.2 07/13/2016   PLT 302 07/13/2016   NA 139 07/13/2016   K 4.9 07/13/2016   CL 104 07/13/2016   CO2 25 07/13/2016   GLUCOSE 84 07/13/2016   BUN 11 07/13/2016   CREATININE 0.61 07/13/2016   BILITOT 0.8 07/13/2016   ALKPHOS 50 07/13/2016   AST 15 07/13/2016   ALT 13 07/13/2016   PROT 6.9 07/13/2016   ALBUMIN 3.8 07/13/2016   CALCIUM 9.2 07/13/2016   GFRAA >89 07/13/2016    Speciality Comments: No specialty comments available.  Procedures:  No procedures performed Allergies: Celebrex [celecoxib]   Assessment / Plan:     Visit Diagnoses: No diagnosis found.   Orders: No orders of the defined types were placed in this encounter.  No orders of the defined types were placed in this encounter.   Face-to-face time spent with patient was *** minutes. Greater than 50% of time was spent in counseling and  coordination of care.  Follow-Up Instructions: No follow-ups on file.   Earnestine Mealing, CMA  Note - This record has been created using Editor, commissioning.  Chart creation errors have been sought, but may not always  have been located. Such creation errors do not reflect on  the standard of medical care.

## 2018-01-16 ENCOUNTER — Ambulatory Visit: Payer: 59 | Admitting: Physician Assistant

## 2018-01-17 ENCOUNTER — Ambulatory Visit: Payer: 59 | Admitting: Physician Assistant

## 2018-01-20 ENCOUNTER — Other Ambulatory Visit: Payer: Self-pay | Admitting: Rheumatology

## 2018-01-21 NOTE — Telephone Encounter (Signed)
Please schedule patient for a follow up visit. Patient due October 2019. Thanks!

## 2018-01-21 NOTE — Telephone Encounter (Signed)
Last visit: 07/17/2017 Next visit: due October 2019.Message sent to the front to schedule patient.   Okay to refill 30 day supply per Dr. Estanislado Pandy

## 2018-01-22 ENCOUNTER — Telehealth: Payer: Self-pay | Admitting: *Deleted

## 2018-01-22 DIAGNOSIS — G4730 Sleep apnea, unspecified: Secondary | ICD-10-CM

## 2018-01-22 DIAGNOSIS — R001 Bradycardia, unspecified: Secondary | ICD-10-CM

## 2018-01-22 NOTE — Telephone Encounter (Signed)
LMOM for patient to call and schedule her follow-up appointment. °

## 2018-01-22 NOTE — Telephone Encounter (Signed)
Notes recorded by Laurine Blazer, LPN on 44/07/8481 at 5:07 AM EST Patient notified. Copy to pmd. Follow up scheduled for 02/19/2018 with Dr. Bronson Ing. She agrees to sleep study. ------  Notes recorded by Laurine Blazer, LPN on 57/05/2254 at 7:20 AM EST Left message to return call.  ------  Notes recorded by Herminio Commons, MD on 01/21/2018 at 4:58 PM EST This study demonstrates: There was a regular rhythm which was at times slow but within the normal range for bradycardia as one would expect heart rate to slow down during sleep. There were no concerning arrhythmias. Medication changes / Follow up studies / Other recommendations:  I would recommend a sleep study. Please send results to the PCP: Doree Albee, MD  Kate Sable, MD 01/21/2018 4:56 PM

## 2018-02-11 DIAGNOSIS — R002 Palpitations: Secondary | ICD-10-CM | POA: Diagnosis not present

## 2018-02-11 DIAGNOSIS — R001 Bradycardia, unspecified: Secondary | ICD-10-CM | POA: Diagnosis not present

## 2018-02-11 DIAGNOSIS — G458 Other transient cerebral ischemic attacks and related syndromes: Secondary | ICD-10-CM | POA: Diagnosis not present

## 2018-02-19 ENCOUNTER — Other Ambulatory Visit: Payer: Self-pay | Admitting: *Deleted

## 2018-02-19 ENCOUNTER — Encounter: Payer: Self-pay | Admitting: Cardiovascular Disease

## 2018-02-19 ENCOUNTER — Ambulatory Visit: Payer: 59 | Admitting: Cardiovascular Disease

## 2018-02-19 ENCOUNTER — Other Ambulatory Visit: Payer: Self-pay | Admitting: Rheumatology

## 2018-02-19 VITALS — BP 113/66 | HR 52 | Ht 62.0 in | Wt 163.6 lb

## 2018-02-19 DIAGNOSIS — G459 Transient cerebral ischemic attack, unspecified: Secondary | ICD-10-CM

## 2018-02-19 DIAGNOSIS — G4730 Sleep apnea, unspecified: Secondary | ICD-10-CM

## 2018-02-19 DIAGNOSIS — R001 Bradycardia, unspecified: Secondary | ICD-10-CM | POA: Diagnosis not present

## 2018-02-19 DIAGNOSIS — R6882 Decreased libido: Secondary | ICD-10-CM | POA: Diagnosis not present

## 2018-02-19 DIAGNOSIS — R5383 Other fatigue: Secondary | ICD-10-CM | POA: Diagnosis not present

## 2018-02-19 DIAGNOSIS — R0789 Other chest pain: Secondary | ICD-10-CM | POA: Diagnosis not present

## 2018-02-19 DIAGNOSIS — N951 Menopausal and female climacteric states: Secondary | ICD-10-CM | POA: Diagnosis not present

## 2018-02-19 NOTE — Progress Notes (Signed)
SUBJECTIVE: The patient returns for follow-up after undergoing cardiovascular testing performed for the evaluation of chest pain and bradycardia.  Event monitoring demonstrated sinus rhythm, sinus bradycardia, and sinus tachycardia.  Symptoms correlated with sinus bradycardia.  There were no significant arrhythmias.  Exercise tolerance test was normal with no evidence of chronotropic incompetence.  It was negative for ischemia.  I reviewed the echocardiogram performed at [facility] earlier this year which demonstrated normal left ventricular systolic function, LVEF 60 to 65%.  She is here with her husband.  She continues to experience palpitations associated with dizziness and fatigue.  One episode occurred while she was walking in [employer].  Another episode occurred while she was lying down in bed.  She denies frank syncope.  She has yet to hear back about the sleep study referral I made.     Review of Systems: As per "subjective", otherwise negative.  Allergies  Allergen Reactions  . Celebrex [Celecoxib] Swelling    Facial swelling    Current Outpatient Medications  Medication Sig Dispense Refill  . aspirin 325 MG EC tablet Take 325 mg by mouth daily.    . baclofen (LIORESAL) 10 MG tablet TAKE 1 TABLET BY MOUTH EVERY DAY AS NEEDED FOR MUSCLE SPASMS 30 tablet 0  . cholecalciferol (VITAMIN D) 1000 units tablet Take 1,000 Units by mouth daily.    . fluticasone (FLONASE) 50 MCG/ACT nasal spray Place 2 sprays into both nostrils daily.    . Misc Natural Products (TART CHERRY ADVANCED PO) Take by mouth.    Marland Kitchen MISC NATURAL PRODUCTS PO Take 1 capsule by mouth daily. Turmeric    . pravastatin (PRAVACHOL) 10 MG tablet TK 1 T PO D IN THE EVE  0   No current facility-administered medications for this visit.     Past Medical History:  Diagnosis Date  . DDD (degenerative disc disease), lumbar 01/19/2016  . History of TIA (transient ischemic attack) 01/19/2016  .  Osteoarthritis of both feet 01/19/2016  . Osteoarthritis of both knees 01/19/2016  . Stroke [facility]) 5/12    Past Surgical History:  Procedure Laterality Date  . ABDOMINAL HYSTERECTOMY    . CHOLECYSTECTOMY    . KNEE ARTHROSCOPY     x2  . LAPAROSCOPIC ASSISTED VAGINAL HYSTERECTOMY  10/11/2011   Procedure: LAPAROSCOPIC ASSISTED VAGINAL HYSTERECTOMY;  Surgeon: Margarette Asal, MD;  Location: [place] [facility];  Service: Gynecology;  Laterality: N/A;  . LASIK      Social History   Socioeconomic History  . Marital status: Divorced    Spouse name: Not on file  . Number of children: Not on file  . Years of education: Not on file  . Highest education level: Not on file  Occupational History  . Not on file  Social Needs  . Financial resource strain: Not on file  . Food insecurity:    Worry: Not on file    Inability: Not on file  . Transportation needs:    Medical: Not on file    Non-medical: Not on file  Tobacco Use  . Smoking status: Never Smoker  . Smokeless tobacco: Never Used  Substance and Sexual Activity  . Alcohol use: Yes    Comment: occasionally  . Drug use: No  . Sexual activity: Not on file  Lifestyle  . Physical activity:    Days per week: Not on file    Minutes per session: Not on file  . Stress: Not on file  Relationships  . Social  connections:    Talks on phone: Not on file    Gets together: Not on file    Attends religious service: Not on file    Active member of club or organization: Not on file    Attends meetings of clubs or organizations: Not on file    Relationship status: Not on file  . Intimate partner violence:    Fear of current or ex partner: Not on file    Emotionally abused: Not on file    Physically abused: Not on file    Forced sexual activity: Not on file  Other Topics Concern  . Not on file  Social History Narrative  . Not on file     Vitals:   02/19/18 0855  BP: 113/66  Pulse: (!) 52  SpO2: 100%  Weight: 163 lb 9.6 oz (74.2 kg)    Height: 5\' 2"  (1.575 m)    Wt Readings from Last 3 Encounters:  02/19/18 163 lb 9.6 oz (74.2 kg)  12/11/17 165 lb (74.8 kg)  07/17/17 163 lb (73.9 kg)     PHYSICAL EXAM General: NAD HEENT: Normal. Neck: No JVD, no thyromegaly. Lungs: Clear to auscultation bilaterally with normal respiratory effort. CV: Bradycardic, regular rhythm, normal S1/S2, no S3/S4, no murmur. No pretibial or periankle edema.  No carotid bruit.   Abdomen: Soft, nontender, no distention.  Neurologic: Alert and oriented.  Psych: Normal affect. Skin: Normal. Musculoskeletal: No gross deformities.    ECG: Reviewed above under Subjective   Labs: Lab Results  Component Value Date/Time   K 4.9 07/13/2016 09:18 AM   BUN 11 07/13/2016 09:18 AM   CREATININE 0.61 07/13/2016 09:18 AM   ALT 13 07/13/2016 09:18 AM   HGB 13.2 07/13/2016 09:18 AM     Lipids: No results found for: LDLCALC, LDLDIRECT, CHOL, TRIG, HDL     ASSESSMENT AND PLAN: 1.  Bradycardia with dizziness and fatigue: Event monitoring was unremarkable.  She was bradycardic during sleep which is normal. Thyroid function is normal.  She is not on any AV nodal blocking agents to explain bradycardia.  There was no evidence of chronotropic incompetence with exercise tolerance testing. She has sleep disordered breathing which can lead to bradycardia and fatigue.  I have made a referral for a sleep study.  I will follow-up on this.  2.    History of TIA: Currently on aspirin 325 mg and pravastatin 10 mg.  3.  Abnormal ECG with chest tightness: Symptoms are atypical in that they can occur both at rest and with exertion.    Echocardiogram and exercise tolerance test are unremarkable.  No further cardiac testing is indicated at this time.   Disposition: Follow up 3 months   Kate Sable, M.D., F.A.C.C.

## 2018-02-19 NOTE — Patient Instructions (Signed)
Your physician recommends that you schedule a follow-up appointment in: 3 MONTHS WITH DR KONESWARAN  Your physician recommends that you continue on your current medications as directed. Please refer to the Current Medication list given to you today.  Thank you for choosing [facility]!!   

## 2018-02-22 DIAGNOSIS — R0982 Postnasal drip: Secondary | ICD-10-CM | POA: Diagnosis not present

## 2018-02-22 DIAGNOSIS — J019 Acute sinusitis, unspecified: Secondary | ICD-10-CM | POA: Diagnosis not present

## 2018-03-21 ENCOUNTER — Other Ambulatory Visit: Payer: Self-pay | Admitting: *Deleted

## 2018-03-21 ENCOUNTER — Encounter: Payer: Self-pay | Admitting: *Deleted

## 2018-03-21 ENCOUNTER — Encounter: Payer: Self-pay | Admitting: Nurse Practitioner

## 2018-03-21 ENCOUNTER — Ambulatory Visit: Payer: 59 | Admitting: Nurse Practitioner

## 2018-03-21 DIAGNOSIS — R1031 Right lower quadrant pain: Secondary | ICD-10-CM | POA: Diagnosis not present

## 2018-03-21 DIAGNOSIS — R112 Nausea with vomiting, unspecified: Secondary | ICD-10-CM

## 2018-03-21 DIAGNOSIS — R109 Unspecified abdominal pain: Secondary | ICD-10-CM | POA: Insufficient documentation

## 2018-03-21 MED ORDER — NA SULFATE-K SULFATE-MG SULF 17.5-3.13-1.6 GM/177ML PO SOLN: 1.0000 | ORAL | 0 refills | Status: DC

## 2018-03-21 NOTE — Progress Notes (Signed)
Primary Care Physician:  Doree Albee, MD Primary Gastroenterologist:  Dr. Gala Romney  Chief Complaint  Patient presents with  . Colonoscopy    never had tcs    HPI:   [name] is a 53 y.o. female who presents on referral from primary care to schedule colonoscopy.  Nurse/phone triage was deferred to office visit due to complaints of right lower quadrant pain, nausea, vomiting.  Reviewed information provided with the referral including [place] office visit note dated 11/27/2017.  At that point it was noted the patient was having nausea and vomiting as well as right lower quadrant tenderness with deep palpation.  The patient felt this was related to Lipitor but did not have significant improvement after stopping Lipitor.  They recommended stopping Lipitor for now due to adverse effects, collect labs.  Recommended referral to GI for colon cancer screening..  Today she states she is doing well overall.  She is 53 years old and is never had a colonoscopy before. She states she previously had some abdominal pain and N/V. She had a TIA and thinks the medications were causing her symptoms. They've since changed up her medications and problems resolved. Denies abdominal pain, N/V, hematochezia, melena, fever, chills, unintentional weight loss. Denies any lingering deficits from her TIA other than some brief memory issues. Denies chest pain, dyspnea, dizziness, lightheadedness, syncope, near syncope. Denies any other upper or lower GI symptoms.  She has seen Dr. Bronson Ing due to bradycardia and palpitations. States all her workup thus far has been normal. Event monitor unremarkable. Has had EKG, stress test, event monitor, thyroid function testing; all normal. Awaiting sleep study.  Past Medical History:  Diagnosis Date  . DDD (degenerative disc disease), lumbar 01/19/2016  . History of TIA (transient ischemic attack) 01/19/2016  . Osteoarthritis of both feet 01/19/2016  . Osteoarthritis of  both knees 01/19/2016  . Stroke [facility]) 07/2010   "TIA" per patient    Past Surgical History:  Procedure Laterality Date  . ABDOMINAL HYSTERECTOMY    . CHOLECYSTECTOMY    . KNEE ARTHROSCOPY     x2  . LAPAROSCOPIC ASSISTED VAGINAL HYSTERECTOMY  10/11/2011   Procedure: LAPAROSCOPIC ASSISTED VAGINAL HYSTERECTOMY;  Surgeon: Margarette Asal, MD;  Location: [facility];  Service: Gynecology;  Laterality: N/A;  . LASIK      Current Outpatient Medications  Medication Sig Dispense Refill  . aspirin 325 MG EC tablet Take 325 mg by mouth daily.    . baclofen (LIORESAL) 10 MG tablet TAKE 1 TABLET BY MOUTH EVERY DAY AS NEEDED FOR MUSCLE SPASMS 30 tablet 0  . cholecalciferol (VITAMIN D) 1000 units tablet Take 1,000 Units by mouth daily.    . fluticasone (FLONASE) 50 MCG/ACT nasal spray Place 2 sprays into both nostrils daily.    . Misc Natural Products (TART CHERRY ADVANCED PO) Take by mouth.    Marland Kitchen MISC NATURAL PRODUCTS PO Take 1 capsule by mouth daily. Turmeric    . pravastatin (PRAVACHOL) 10 MG tablet TK 1 T PO D IN THE EVE  0  . thyroid (ARMOUR) 30 MG tablet Take 30 mg by mouth daily before breakfast.     No current facility-administered medications for this visit.     Allergies as of 03/21/2018 - Review Complete 03/21/2018  Allergen Reaction Noted  . Celebrex [celecoxib] Swelling 10/02/2011    Family History  Problem Relation Age of Onset  . Colon cancer Paternal Uncle     Social History   Socioeconomic History  .  Marital status: Divorced    Spouse name: Not on file  . Number of children: Not on file  . Years of education: Not on file  . Highest education level: Not on file  Occupational History  . Not on file  Social Needs  . Financial resource strain: Not on file  . Food insecurity:    Worry: Not on file    Inability: Not on file  . Transportation needs:    Medical: Not on file    Non-medical: Not on file  Tobacco Use  . Smoking status: Never Smoker  . Smokeless tobacco:  Never Used  Substance and Sexual Activity  . Alcohol use: Yes    Comment: occasionally  . Drug use: No  . Sexual activity: Not on file  Lifestyle  . Physical activity:    Days per week: Not on file    Minutes per session: Not on file  . Stress: Not on file  Relationships  . Social connections:    Talks on phone: Not on file    Gets together: Not on file    Attends religious service: Not on file    Active member of club or organization: Not on file    Attends meetings of clubs or organizations: Not on file    Relationship status: Not on file  . Intimate partner violence:    Fear of current or ex partner: Not on file    Emotionally abused: Not on file    Physically abused: Not on file    Forced sexual activity: Not on file  Other Topics Concern  . Not on file  Social History Narrative  . Not on file    Review of Systems: General: Negative for anorexia, weight loss, fever, chills, fatigue, weakness. ENT: Negative for hoarseness, difficulty swallowing , nasal congestion. CV: Negative for chest pain, angina, palpitations, peripheral edema.  Respiratory: Negative for dyspnea at rest, cough, sputum, wheezing.  GI: See history of present illness. MS: Chronic arthritis pain.  Derm: Negative for rash or itching.  Endo: Negative for unusual weight change.  Heme: Negative for bruising or bleeding. Allergy: Negative for rash or hives.    Physical Exam: BP 123/66   Pulse (!) 57   Temp (!) 97.1 F (36.2 C) (Oral)   Ht 5\' 2"  (1.575 m)   Wt 161 lb 9.6 oz (73.3 kg)   LMP 01/09/2011   BMI 29.56 kg/m  General:   Alert and oriented. Pleasant and cooperative. Well-nourished and well-developed.  Head:  Normocephalic and atraumatic. Eyes:  Without icterus, sclera clear and conjunctiva pink.  Ears:  Normal auditory acuity. Cardiovascular:  S1, S2 present without murmurs appreciated. Extremities without clubbing or edema. Respiratory:  Clear to auscultation bilaterally. No wheezes,  rales, or rhonchi. No distress.  Gastrointestinal:  +BS, soft, non-tender and non-distended. No HSM noted. No guarding or rebound. No masses appreciated.  Rectal:  Deferred  Musculoskalatal:  Symmetrical without gross deformities. Neurologic:  Alert and oriented x4;  grossly normal neurologically. Psych:  Alert and cooperative. Normal mood and affect. Heme/Lymph/Immune: No excessive bruising noted.    03/21/2018 11:31 AM   Disclaimer: This note was dictated with voice recognition software. Similar sounding words can inadvertently be transcribed and may not be corrected upon review.

## 2018-03-21 NOTE — Progress Notes (Signed)
cc'd to pcp 

## 2018-03-21 NOTE — Assessment & Plan Note (Signed)
Primary CARE note indicated nausea and vomiting.  The patient feels this is due to medications, specifically Lipitor.  Her medications have since been changed and her symptoms have resolved.  Denies any nausea and vomiting recently.  Generally asymptomatic from a GI standpoint at this time.  Return for follow-up as needed.  Colonoscopy as per below.

## 2018-03-21 NOTE — Patient Instructions (Signed)
1. We will schedule your colonoscopy for you. 2. Further recommendations will be made after your colonoscopy. 3. Return for follow-up as needed. 4. Call us if you have any questions or concerns.  At [facility] we value your feedback. You may receive a survey about your visit today. Please share your experience as we strive to create trusting relationships with our patients to provide genuine, compassionate, quality care.  We appreciate your understanding and patience as we review any laboratory studies, imaging, and other diagnostic tests that are ordered as we care for you. Our office policy is 5 business days for review of these results, and any emergent or urgent results are addressed in a timely manner for your best interest. If you do not hear from our office in 1 week, please contact us.   We also encourage the use of MyChart, which contains your medical information for your review as well. If you are not enrolled in this feature, an access code is on this after visit summary for your convenience. Thank you for allowing [place] to be involved in your care.  It was great to see you today!  I hope you have a great 2020!!

## 2018-03-21 NOTE — Assessment & Plan Note (Signed)
Initially primary care visit indicated right lower quadrant abdominal tenderness with deep palpation.  The patient states she feels this is related to medications she was started on after her TIA.  Of note, no residual TIA deficits.  Since her medications have been changed her symptoms have resolved.  No further abdominal pain.  No GI symptoms in general.  She is currently due for first ever screening colonoscopy and we will proceed with scheduling.  Return for follow-up as needed.  Proceed with TCS with Dr. Gala Romney in near future: the risks, benefits, and alternatives have been discussed with the patient in detail. The patient states understanding and desires to proceed.  The patient is not on any anticoagulants, anxiolytics, chronic pain medications, or antidepressants.  Conscious sedation should be adequate for her procedure.

## 2018-03-25 ENCOUNTER — Telehealth: Payer: Self-pay | Admitting: *Deleted

## 2018-03-25 DIAGNOSIS — R5383 Other fatigue: Secondary | ICD-10-CM | POA: Diagnosis not present

## 2018-03-25 DIAGNOSIS — E785 Hyperlipidemia, unspecified: Secondary | ICD-10-CM | POA: Diagnosis not present

## 2018-03-25 DIAGNOSIS — E663 Overweight: Secondary | ICD-10-CM | POA: Diagnosis not present

## 2018-03-25 NOTE — Telephone Encounter (Signed)
PA for TCS was approved via [employer] website. Auth# [id number] dates 05/15/2018-08/13/2018.

## 2018-03-28 NOTE — Progress Notes (Signed)
Office Visit Note  Patient: [name]             Date of Birth: [birth year]           MRN: [id number]             PCP: Doree Albee, MD Referring: Doree Albee, MD Visit Date: 04/04/2018 Occupation: @GUAROCC @  Subjective:  Right SI joint pain   History of Present Illness: [name] is a 53 y.o. female with history of osteoarthritis and DDD.  She takes baclofen 10 mg 1 tablet by mouth PRN for muscle spasms.  Patient presents today with right SI joint pain.  She states that the pain has been worsening over the past 2 to 3 weeks.  She states the pain was so severe on Friday she had to leave work.  She states that she was out of her prescription for baclofen this week and so was in a lot of discomfort.  She denies any radiating pain or numbness.  She is been having significant pain at night.  She says she is is having discomfort in her right knee joint.  She states that the Visco gel injection that she had on 09/24/2017 was not as effective as Euflexxa in the past.  She would like to reapply for Euflexxa gel injections.  She states that her right knee will occasionally catch and cause sharp pain.  She states she notices occasional right knee joint swelling.  She has occasional mild discomfort in bilateral hands.  She has no joint swelling.  She has noticed decreased grip strength in both hands.   Activities of Daily Living:  Patient reports morning stiffness for 30-60 minutes.   Patient Reports nocturnal pain.  Difficulty dressing/grooming: Denies Difficulty climbing stairs: Reports Difficulty getting out of chair: Reports Difficulty using hands for taps, buttons, cutlery, and/or writing: Denies  Review of Systems  Constitutional: Negative for fatigue.  HENT: Negative for mouth sores, mouth dryness and nose dryness.   Eyes: Negative for pain, visual disturbance and dryness.  Respiratory: Negative for cough, hemoptysis, shortness of breath and difficulty breathing.     Cardiovascular: Positive for palpitations. Negative for chest pain, hypertension and swelling in legs/feet.  Gastrointestinal: Negative for blood in stool, constipation and diarrhea.  Endocrine: Negative for increased urination.  Genitourinary: Negative for painful urination.  Musculoskeletal: Positive for arthralgias, joint pain, morning stiffness and muscle tenderness. Negative for joint swelling, myalgias, muscle weakness and myalgias.  Skin: Negative for color change, pallor, rash, hair loss, nodules/bumps, skin tightness, ulcers and sensitivity to sunlight.  Allergic/Immunologic: Negative for susceptible to infections.  Neurological: Negative for dizziness, numbness, headaches and weakness.  Hematological: Negative for swollen glands.  Psychiatric/Behavioral: Negative for depressed mood and sleep disturbance. The patient is not nervous/anxious.     PMFS History:  Patient Active Problem List   Diagnosis Date Noted  . Abdominal pain 03/21/2018  . Nausea with vomiting 03/21/2018  . Primary osteoarthritis of both knees 09/24/2017  . Osteoarthritis of both knees 01/19/2016  . Osteoarthritis of both feet 01/19/2016  . History of TIA (transient ischemic attack) 01/19/2016  . DDD (degenerative disc disease), lumbar 01/19/2016  . Nodular episcleritis of both eyes 01/19/2016  . Pain in right knee 01/19/2016  . Leiomyoma 10/12/2011  . Female pelvic pain 10/12/2011    Past Medical History:  Diagnosis Date  . DDD (degenerative disc disease), lumbar 01/19/2016  . History of TIA (transient ischemic attack) 01/19/2016  . Osteoarthritis of both  feet 01/19/2016  . Osteoarthritis of both knees 01/19/2016  . Stroke [facility]) 07/2010   "TIA" per patient    Family History  Problem Relation Age of Onset  . Colon cancer Paternal Uncle    Past Surgical History:  Procedure Laterality Date  . ABDOMINAL HYSTERECTOMY    . CHOLECYSTECTOMY    . KNEE ARTHROSCOPY     x2  . LAPAROSCOPIC ASSISTED VAGINAL  HYSTERECTOMY  10/11/2011   Procedure: LAPAROSCOPIC ASSISTED VAGINAL HYSTERECTOMY;  Surgeon: Margarette Asal, MD;  Location: [facility];  Service: Gynecology;  Laterality: N/A;  . LASIK     Social History   Social History Narrative  . Not on file    There is no immunization history on file for this patient.   Objective: Vital Signs: BP 124/76 (BP Location: Left Arm, Patient Position: Sitting, Cuff Size: Small)   Pulse 62   Resp 12   Ht 5\' 2"  (1.575 m)   Wt 163 lb (73.9 kg)   LMP 01/09/2011   BMI 29.81 kg/m    Physical Exam Vitals signs and nursing note reviewed.  Constitutional:      Appearance: She is well-developed.  HENT:     Head: Normocephalic and atraumatic.  Eyes:     Conjunctiva/sclera: Conjunctivae normal.  Neck:     Musculoskeletal: Normal range of motion.  Cardiovascular:     Rate and Rhythm: Normal rate and regular rhythm.     Heart sounds: Normal heart sounds.  Pulmonary:     Effort: Pulmonary effort is normal.     Breath sounds: Normal breath sounds.  Abdominal:     General: Bowel sounds are normal.     Palpations: Abdomen is soft.  Lymphadenopathy:     Cervical: No cervical adenopathy.  Skin:    General: Skin is warm and dry.     Capillary Refill: Capillary refill takes less than 2 seconds.  Neurological:     Mental Status: She is alert and oriented to person, place, and time.  Psychiatric:        Behavior: Behavior normal.      Musculoskeletal Exam: C-spine, thoracic spine, lumbar spine good range of motion.  No midline spinal tenderness.  She has right SI joint tenderness on exam.  Shoulder joints, elbow joints, wrist joints, MCPs, PIPs, DIPs good range of motion with no synovitis.  She has complete fist formation bilaterally.  She has minimal PIP and DIP synovial thickening consistent with osteoarthritis of bilateral hands.  Hip joints, knee joints, ankle joints, MCPs, PIPs, DIPs good range of motion with no synovitis.  No warmth or effusion of  bilateral knee joints.  No tenderness or swelling of ankle joints.  No tenderness over trochanteric bursa bilaterally.  CDAI Exam: CDAI Score: Not documented Patient Global Assessment: Not documented; Provider Global Assessment: Not documented Swollen: Not documented; Tender: Not documented Joint Exam   Not documented   There is currently no information documented on the homunculus. Go to the Rheumatology activity and complete the homunculus joint exam.  Investigation: No additional findings.  Imaging: No results found.  Recent Labs: Lab Results  Component Value Date   WBC 5.4 07/13/2016   HGB 13.2 07/13/2016   PLT 302 07/13/2016   NA 139 07/13/2016   K 4.9 07/13/2016   CL 104 07/13/2016   CO2 25 07/13/2016   GLUCOSE 84 07/13/2016   BUN 11 07/13/2016   CREATININE 0.61 07/13/2016   BILITOT 0.8 07/13/2016   ALKPHOS 50 07/13/2016   AST  15 07/13/2016   ALT 13 07/13/2016   PROT 6.9 07/13/2016   ALBUMIN 3.8 07/13/2016   CALCIUM 9.2 07/13/2016   GFRAA >89 07/13/2016    Speciality Comments: No specialty comments available.  Procedures:  Sacroiliac Joint Inj on 04/04/2018 2:01 PM Indications: pain Details: 27 G 1.5 in needle, posterior approach Medications: 1 mL lidocaine 1 %; 40 mg triamcinolone acetonide 40 MG/ML Aspirate: 0 mL Outcome: tolerated well, no immediate complications Procedure, treatment alternatives, risks and benefits explained, specific risks discussed. Consent was given by the patient. Immediately prior to procedure a time out was called to verify the correct patient, procedure, equipment, support staff and site/side marked as required. Patient was prepped and draped in the usual sterile fashion.     Allergies: Celebrex [celecoxib]   Assessment / Plan:     Visit Diagnoses: Primary osteoarthritis of both knees: She has no warmth or effusion.  She has good range of motion.  She has chronic right knee pain.  She takes occasionally experiences a catching  sensation and sharp pain.  She had an x-ray of the right knee on 01/12/2017 that revealed moderate osteoarthritis and severe chondromalacia patella.  She had a Monovisc gel injection performed on 09/24/2017 that was not as effective as Euflexxa has been in the past.  She would like to reapply for Euflexxa gel injections for the right knee joint.  She declined a cortisone injection today in the office due to having an inadequate response in the past.   Primary osteoarthritis of both feet: She has osteoarthritic changes in bilateral feet.  She has no discomfort in her feet at this time.  She was proper fitting shoes  DDD (degenerative disc disease), lumbar: No midline spinal tenderness.  Good range of motion.  Chronic right SI joint pain: She has right SI joint tenderness on exam today.  She has been having significant discomfort for the past 2 to 3 weeks.  The pain was so severe on Friday she had to leave work.  She has been taking baclofen 10 mg 2-3 times per day for muscle spasms.  She requested a right SI joint cortisone injection today in the office.  She tolerated the procedure well.  The procedure note is completed above.  Other medical conditions are listed as follows:  Nodular episcleritis of both eyes  History of TIA (transient ischemic attack) - August 2019    Orders: Orders Placed This Encounter  Procedures  . Sacroiliac Joint Inj   No orders of the defined types were placed in this encounter.   Face-to-face time spent with patient was 30 minutes. Greater than 50% of time was spent in counseling and coordination of care.  Follow-Up Instructions: Return in about 6 months (around 10/03/2018) for Osteoarthritis, DDD.   Ofilia Neas, PA-C   I examined and evaluated the patient with Hazel Sams PA.  Patient had tenderness on palpation over right SI joint today.  Cortisone as described above.  She tolerated the procedure well.  The plan of care was discussed as noted above.  Bo Merino, MD  Note - This record has been created using Editor, commissioning.  Chart creation errors have been sought, but may not always  have been located. Such creation errors do not reflect on  the standard of medical care.

## 2018-03-29 ENCOUNTER — Telehealth: Payer: Self-pay | Admitting: Rheumatology

## 2018-03-29 NOTE — Telephone Encounter (Signed)
Patient has an upcoming appt next week, but is almost out of Baclofen 10mg . Please send refill  to [facility] on [address]

## 2018-04-01 MED ORDER — BACLOFEN 10 MG PO TABS: ORAL_TABLET | ORAL | 0 refills | Status: DC

## 2018-04-01 NOTE — Telephone Encounter (Signed)
Last visit: 07/17/2017 Next visit: 04/04/18  Okay to refill per Dr. Estanislado Pandy

## 2018-04-03 ENCOUNTER — Ambulatory Visit: Payer: 59 | Admitting: Neurology

## 2018-04-03 ENCOUNTER — Encounter: Payer: Self-pay | Admitting: Neurology

## 2018-04-03 VITALS — BP 112/69 | HR 53 | Ht 62.0 in | Wt 165.0 lb

## 2018-04-03 DIAGNOSIS — R001 Bradycardia, unspecified: Secondary | ICD-10-CM | POA: Diagnosis not present

## 2018-04-03 DIAGNOSIS — E669 Obesity, unspecified: Secondary | ICD-10-CM | POA: Diagnosis not present

## 2018-04-03 DIAGNOSIS — R0683 Snoring: Secondary | ICD-10-CM

## 2018-04-03 DIAGNOSIS — R002 Palpitations: Secondary | ICD-10-CM

## 2018-04-03 DIAGNOSIS — R51 Headache: Secondary | ICD-10-CM

## 2018-04-03 DIAGNOSIS — G4719 Other hypersomnia: Secondary | ICD-10-CM | POA: Diagnosis not present

## 2018-04-03 DIAGNOSIS — R519 Headache, unspecified: Secondary | ICD-10-CM

## 2018-04-03 DIAGNOSIS — Z8673 Personal history of transient ischemic attack (TIA), and cerebral infarction without residual deficits: Secondary | ICD-10-CM | POA: Diagnosis not present

## 2018-04-03 NOTE — Patient Instructions (Signed)

## 2018-04-03 NOTE — Progress Notes (Signed)
Subjective:    Patient ID: [name] is a 53 y.o. female.  HPI     [name], MD, PhD [facility] [address].O. [place], [place] [address]  Dear Jamesetta So,   I saw your patient, [name], upon your kind request, in my sleep clinic today for initial consultation of her sleep disorder, in particular, concern for underlying obstructive sleep apnea. The patient is unaccompanied today. As you know, [name] is a 53 year old right-handed woman with an underlying medical history of degenerative lumbar spine disease, history of TIA, osteoarthritis, palpitations, symptomatic bradycardia and borderline obesity, who reports snoring and excessive daytime somnolence. I reviewed your office note from 02/19/2018. Her Epworth sleepiness score is 8 out of 24 today, fatigue score is 25 out of 63. She lives with her husband, they have 1 child. She works at [employer]. She is a nonsmoker and does not typically with Flagyl: Regular basis, caffeine and limitation, typically 2 cups of coffee per day on average. She was diagnosed with a TIA in August 2019. She was kept overnight and had workup for this at an outside facility and saw Dr. Merlene Laughter in follow-up. She reports a prior history of TIA several years ago. She has been on aspirin but it was increased to an adult size aspirin in August 2019. Her bedtime is late, between 13 and 11:30 because her husband works third shift and she tries to stay up until he leaves. She has to be up at 5:30 for work. She lives with her husband but her son moved in recently with his family including wife and 56-monthold daughter [place] reports that she makes popping sounds at times. She had workup for palpitation and bradycardia. She had a heart monitor for 30 days which showed bradycardia. She had a exercise stress test which showed no evidence of ischemia. She has no family history of  sleep apnea but her stepmother has sleep apnea. Patient denies night to night nocturia or restless leg symptoms but has had the occasional morning headache. She does feel tired at times. She typically does not take a nap. She has had significant weight loss about 2-3 years ago, she was followed by a weight loss clinic through [facility] lost about 40 pounds. They have 1 dog in the household, sometimes in the bed with her. She has some nighttime bruxism and sometimes she has discomfort at night secondary to back pain or knee pain. She sees rheumatology for arthritis and has been using baclofen for muscle spasms.  Her Past Medical History Is Significant For: Past Medical History:  Diagnosis Date  . DDD (degenerative disc disease), lumbar 01/19/2016  . History of TIA (transient ischemic attack) 01/19/2016  . Osteoarthritis of both feet 01/19/2016  . Osteoarthritis of both knees 01/19/2016  . Stroke ([facility] 07/2010   "TIA" per patient    Her Past Surgical History Is Significant For: Past Surgical History:  Procedure Laterality Date  . ABDOMINAL HYSTERECTOMY    . CHOLECYSTECTOMY    . KNEE ARTHROSCOPY     x2  . LAPAROSCOPIC ASSISTED VAGINAL HYSTERECTOMY  10/11/2011   Procedure: LAPAROSCOPIC ASSISTED VAGINAL HYSTERECTOMY;  Surgeon: RMargarette Asal MD;  [facility];  Service: Gynecology;  Laterality: N/A;  . LASIK      Her Family History Is Significant For: Family History  Problem Relation Age of Onset  . Colon cancer Paternal Uncle  Her Social History Is Significant For: Social History   Socioeconomic History  . Marital status: Divorced    Spouse name: Not on file  . Number of children: Not on file  . Years of education: Not on file  . Highest education level: Not on file  Occupational History  . Not on file  Social Needs  . Financial resource strain: Not on file  . Food insecurity:    Worry: Not on file    Inability: Not on file  . Transportation needs:     Medical: Not on file    Non-medical: Not on file  Tobacco Use  . Smoking status: Never Smoker  . Smokeless tobacco: Never Used  Substance and Sexual Activity  . Alcohol use: Yes    Comment: occasionally  . Drug use: No  . Sexual activity: Not on file  Lifestyle  . Physical activity:    Days per week: Not on file    Minutes per session: Not on file  . Stress: Not on file  Relationships  . Social connections:    Talks on phone: Not on file    Gets together: Not on file    Attends religious service: Not on file    Active member of club or organization: Not on file    Attends meetings of clubs or organizations: Not on file    Relationship status: Not on file  Other Topics Concern  . Not on file  Social History Narrative  . Not on file    Her Allergies Are:  Allergies  Allergen Reactions  . Celebrex [Celecoxib] Swelling    Facial swelling  :   Her Current Medications Are:  Outpatient Encounter Medications as of 04/03/2018  Medication Sig  . aspirin 325 MG EC tablet Take 325 mg by mouth daily.  . baclofen (LIORESAL) 10 MG tablet TAKE 1 TABLET BY MOUTH EVERY DAY AS NEEDED FOR MUSCLE SPASMS  . cholecalciferol (VITAMIN D) 1000 units tablet Take 1,000 Units by mouth daily.  . fluticasone (FLONASE) 50 MCG/ACT nasal spray Place 2 sprays into both nostrils daily.  . Misc Natural Products (TART CHERRY ADVANCED PO) Take by mouth.  Marland Kitchen MISC NATURAL PRODUCTS PO Take 1 capsule by mouth daily. Turmeric  . Na Sulfate-K Sulfate-Mg Sulf 17.5-3.13-1.6 GM/177ML SOLN Take 1 kit by mouth as directed.  . pravastatin (PRAVACHOL) 10 MG tablet TK 1 T PO D IN THE EVE  . thyroid (ARMOUR) 30 MG tablet Take 30 mg by mouth daily before breakfast.   No facility-administered encounter medications on file as of 04/03/2018.   :  Review of Systems:  Out of a complete 14 point review of systems, all are reviewed and negative with the exception of these symptoms as listed below: Review of Systems   Neurological:       Pt presents today to discuss her sleep. Pt reports that she has never had a sleep study but does endorse snoring.  Epworth Sleepiness Scale 0= would never doze 1= slight chance of dozing 2= moderate chance of dozing 3= high chance of dozing  Sitting and reading: 2 Watching TV: 1 Sitting inactive in a public place (ex. Theater or meeting): 1 As a passenger in a car for an hour without a break: 1 Lying down to rest in the afternoon: 2 Sitting and talking to someone: 0 Sitting quietly after lunch (no alcohol): 1 In a car, while stopped in traffic: 0  Total: 8     Objective:  Neurological Exam  Physical Exam Physical Examination:   Vitals:   04/03/18 0856  BP: 112/69  Pulse: (!) 53    General Examination: The patient is a very pleasant 53 y.o. female in no acute distress. She appears well-developed and well-nourished and well groomed.   HEENT: Normocephalic, atraumatic, pupils are equal, round and reactive to light and accommodation. EOMI with normal smooth pursuit is noted. Hearing is grossly intact. Tympanic membranes are clear bilaterally. Face is symmetric with normal facial animation and normal facial sensation. Speech is clear with no dysarthria noted. There is no hypophonia. There is no lip, neck/head, jaw or voice tremor. Neck is supple with full range of passive and active motion. There are no carotid bruits on auscultation. Oropharynx exam reveals: mild mouth dryness, good dental hygiene and mild to moderate airway crowding secondary to quite small airway entry, thicker soft palate, tonsils are not fully visualized but appear to be on the smaller side, tucked in. Mallampati is class II. Neck circumference is 13-1/2 inches. Tongue protrudes centrally and palate elevates symmetrically.  Chest: Clear to auscultation without wheezing, rhonchi or crackles noted.  Heart: S1+S2+0, regular and normal without murmurs, rubs or gallops noted. Bradycardia.    Abdomen: Soft, non-tender and non-distended with normal bowel sounds appreciated on auscultation.  Extremities: There is no pitting edema in the distal lower extremities bilaterally.  Skin: Warm and dry without trophic changes noted.  Musculoskeletal: exam reveals no obvious joint deformities, tenderness or joint swelling or erythema.   Neurologically:  Mental status: The patient is awake, alert and oriented in all 4 spheres. Her immediate and remote memory, attention, language skills and fund of knowledge are appropriate. There is no evidence of aphasia, agnosia, apraxia or anomia. Speech is clear with normal prosody and enunciation. Thought process is linear. Mood is normal and affect is normal.  Cranial nerves II - XII are as described above under HEENT exam. In addition: shoulder shrug is normal with equal shoulder height noted. Motor exam: Normal bulk, strength and tone is noted. There is no drift, tremor or rebound. Romberg is negative. Fine motor skills and coordination: intact with normal finger taps, normal hand movements, normal rapid alternating patting, normal foot taps and normal foot agility.  Cerebellar testing: No dysmetria or intention tremor.  Sensory exam: intact to light touch in the upper and lower extremities.  Gait, station and balance: She stands easily. No veering to one side is noted. No leaning to one side is noted. Posture is age-appropriate and stance is narrow based. Gait shows normal stride length and normal pace. No problems turning are noted. Tandem walk is unremarkable.   Assessment and Plan:  In summary, MADELINA SANDA is a very pleasant 53 y.o.-year old female with an underlying medical history of degenerative lumbar spine disease, history of TIA, osteoarthritis, palpitations, symptomatic bradycardia and borderline obesity, whose history and physical exam are concerning for obstructive sleep apnea (OSA). I had a long chat with the patient about my findings  and the diagnosis of OSA, its prognosis and treatment options. We talked about medical treatments, surgical interventions and non-pharmacological approaches. I explained in particular the risks and ramifications of untreated moderate to severe OSA, especially with respect to developing cardiovascular disease down the Road, including congestive heart failure, difficult to treat hypertension, cardiac arrhythmias, or stroke. Even type 2 diabetes has, in part, been linked to untreated OSA. Symptoms of untreated OSA include daytime sleepiness, memory problems, mood irritability and mood disorder such as depression and anxiety, lack  of energy, as well as recurrent headaches, especially morning headaches. We talked about trying to maintain a healthy lifestyle in general, as well as the importance of weight control. I encouraged the patient to eat healthy, exercise daily and keep well hydrated, to keep a scheduled bedtime and wake time routine, to not skip any meals and eat healthy snacks in between meals. I advised the patient not to drive when feeling sleepy. I recommended the following at this time: sleep study with potential positive airway pressure titration. (We will score hypopneas at 4%).   I explained the sleep test procedure to the patient and also outlined possible surgical and non-surgical treatment options of OSA, including the use of CPAP. She indicated that she would be willing to try CPAP if the need arises. I explained the importance of being compliant with PAP treatment, not only for insurance purposes but primarily to improve Her symptoms, and for the patient's long term health benefit, including to reduce Her cardiovascular risks. I answered all her questions today and the patient was in agreement. I plan to see her back after the sleep study is completed and encouraged her to call with any interim questions, concerns, problems or updates.   Thank you very much for allowing me to participate in the  care of this nice patient. If I can be of any further assistance to you please do not hesitate to call me at [phone].  Sincerely,   [name], MD, PhD

## 2018-04-04 ENCOUNTER — Telehealth: Payer: Self-pay

## 2018-04-04 ENCOUNTER — Encounter: Payer: Self-pay | Admitting: Rheumatology

## 2018-04-04 ENCOUNTER — Ambulatory Visit: Payer: 59 | Admitting: Rheumatology

## 2018-04-04 VITALS — BP 124/76 | HR 62 | Resp 12 | Ht 62.0 in | Wt 163.0 lb

## 2018-04-04 DIAGNOSIS — G8929 Other chronic pain: Secondary | ICD-10-CM

## 2018-04-04 DIAGNOSIS — Z6829 Body mass index (BMI) 29.0-29.9, adult: Secondary | ICD-10-CM | POA: Diagnosis not present

## 2018-04-04 DIAGNOSIS — M533 Sacrococcygeal disorders, not elsewhere classified: Secondary | ICD-10-CM | POA: Diagnosis not present

## 2018-04-04 DIAGNOSIS — M19071 Primary osteoarthritis, right ankle and foot: Secondary | ICD-10-CM

## 2018-04-04 DIAGNOSIS — M5136 Other intervertebral disc degeneration, lumbar region: Secondary | ICD-10-CM

## 2018-04-04 DIAGNOSIS — Z8673 Personal history of transient ischemic attack (TIA), and cerebral infarction without residual deficits: Secondary | ICD-10-CM

## 2018-04-04 DIAGNOSIS — M170 Bilateral primary osteoarthritis of knee: Secondary | ICD-10-CM | POA: Diagnosis not present

## 2018-04-04 DIAGNOSIS — H15123 Nodular episcleritis, bilateral: Secondary | ICD-10-CM

## 2018-04-04 DIAGNOSIS — E663 Overweight: Secondary | ICD-10-CM | POA: Diagnosis not present

## 2018-04-04 DIAGNOSIS — R001 Bradycardia, unspecified: Secondary | ICD-10-CM | POA: Diagnosis not present

## 2018-04-04 DIAGNOSIS — M19072 Primary osteoarthritis, left ankle and foot: Secondary | ICD-10-CM

## 2018-04-04 MED ORDER — TRIAMCINOLONE ACETONIDE 40 MG/ML IJ SUSP
40.0000 mg | INTRAMUSCULAR | Status: AC | PRN
  Administered 2018-04-04: 40 mg via INTRA_ARTICULAR

## 2018-04-04 MED ORDER — LIDOCAINE HCL 1 % IJ SOLN
1.0000 mL | INTRAMUSCULAR | Status: AC | PRN
  Administered 2018-04-04: 1 mL

## 2018-04-04 NOTE — Telephone Encounter (Signed)
Please apply for right knee visco, per Lovena Le. Thanks!

## 2018-04-10 NOTE — Telephone Encounter (Signed)
Noted  

## 2018-04-16 ENCOUNTER — Telehealth (INDEPENDENT_AMBULATORY_CARE_PROVIDER_SITE_OTHER): Payer: Self-pay

## 2018-04-16 NOTE — Telephone Encounter (Signed)
Submitted VOB for Euflexxa, right knee. 

## 2018-04-22 ENCOUNTER — Telehealth (INDEPENDENT_AMBULATORY_CARE_PROVIDER_SITE_OTHER): Payer: Self-pay

## 2018-04-22 NOTE — Telephone Encounter (Signed)
Please schedule patient an appointment with Dr. Estanislado Pandy or Lovena Le for gel injection.  Thank You.  Approved for Euflexxa series, right knee. Buy & Bill Covered at 100% of the allowable amount. Co-pay of $17.00 each visit No PA required

## 2018-04-26 ENCOUNTER — Other Ambulatory Visit: Payer: Self-pay | Admitting: Rheumatology

## 2018-04-26 NOTE — Telephone Encounter (Signed)
Last visit: 04/04/18 Next visit: 10/03/18  Okay to refill per Dr. Deveshwar 

## 2018-05-02 ENCOUNTER — Ambulatory Visit: Payer: 59 | Admitting: Physician Assistant

## 2018-05-02 DIAGNOSIS — M1711 Unilateral primary osteoarthritis, right knee: Secondary | ICD-10-CM

## 2018-05-02 MED ORDER — LIDOCAINE HCL 1 % IJ SOLN
1.5000 mL | INTRAMUSCULAR | Status: AC | PRN
  Administered 2018-05-02: 1.5 mL

## 2018-05-02 MED ORDER — SODIUM HYALURONATE (VISCOSUP) 20 MG/2ML IX SOSY
20.0000 mg | PREFILLED_SYRINGE | INTRA_ARTICULAR | Status: AC | PRN
  Administered 2018-05-02: 20 mg via INTRA_ARTICULAR

## 2018-05-02 NOTE — Progress Notes (Signed)
   Procedure Note  Patient: [name]             Date of Birth: Apr 18, 1965           MRN: [id number]             Visit Date: 05/02/2018  Procedures: Visit Diagnoses: Primary osteoarthritis of right knee - Plan: Large Joint Inj: R knee euflexxa #1 right knee B/B Large Joint Inj: R knee on 05/02/2018 2:46 PM Indications: pain Details: 27 G 1.5 in needle, medial approach  Arthrogram: No  Medications: 20 mg Sodium Hyaluronate 20 MG/2ML; 1.5 mL lidocaine 1 % Aspirate: 0 mL Outcome: tolerated well, no immediate complications Procedure, treatment alternatives, risks and benefits explained, specific risks discussed. Consent was given by the patient. Immediately prior to procedure a time out was called to verify the correct patient, procedure, equipment, support staff and site/side marked as required. Patient was prepped and draped in the usual sterile fashion.      Patient tolerated the procedure well.  Hazel Sams, PA-C

## 2018-05-07 ENCOUNTER — Telehealth: Payer: Self-pay

## 2018-05-07 NOTE — Telephone Encounter (Signed)
Pt to call us back with new insurance card information to submit sleep study requests

## 2018-05-09 ENCOUNTER — Ambulatory Visit: Payer: 59 | Admitting: Physician Assistant

## 2018-05-09 DIAGNOSIS — M1711 Unilateral primary osteoarthritis, right knee: Secondary | ICD-10-CM | POA: Diagnosis not present

## 2018-05-09 MED ORDER — SODIUM HYALURONATE (VISCOSUP) 20 MG/2ML IX SOSY
20.0000 mg | PREFILLED_SYRINGE | INTRA_ARTICULAR | Status: AC | PRN
  Administered 2018-05-09: 20 mg via INTRA_ARTICULAR

## 2018-05-09 MED ORDER — LIDOCAINE HCL 1 % IJ SOLN
1.5000 mL | INTRAMUSCULAR | Status: AC | PRN
  Administered 2018-05-09: 1.5 mL

## 2018-05-09 NOTE — Progress Notes (Signed)
   Procedure Note  Patient: [name]             Date of Birth: Jul 01, 1965           MRN: [id number]             Visit Date: 05/09/2018  Procedures: Visit Diagnoses: Primary osteoarthritis of right knee - Plan: Large Joint Inj: R knee Euflexxa #2 Right knee joint injection  Large Joint Inj: R knee on 05/09/2018 1:50 PM Indications: pain Details: 27 G 1.5 in needle, medial approach  Arthrogram: No  Medications: 20 mg Sodium Hyaluronate 20 MG/2ML; 1.5 mL lidocaine 1 % Aspirate: 0 mL Outcome: tolerated well, no immediate complications Procedure, treatment alternatives, risks and benefits explained, specific risks discussed. Consent was given by the patient. Immediately prior to procedure a time out was called to verify the correct patient, procedure, equipment, support staff and site/side marked as required. Patient was prepped and draped in the usual sterile fashion.     Patient tolerated the procedure well.  Hazel Sams, PA-C

## 2018-05-15 ENCOUNTER — Encounter (HOSPITAL_COMMUNITY): Payer: Self-pay | Admitting: *Deleted

## 2018-05-15 ENCOUNTER — Ambulatory Visit (HOSPITAL_COMMUNITY)
Admission: RE | Admit: 2018-05-15 | Discharge: 2018-05-15 | Disposition: A | Discharge status: Home / Self Care | Payer: 59 | Attending: Internal Medicine | Admitting: Internal Medicine

## 2018-05-15 ENCOUNTER — Other Ambulatory Visit: Payer: Self-pay

## 2018-05-15 ENCOUNTER — Encounter (HOSPITAL_COMMUNITY)
Admission: RE | Disposition: A | Discharge status: Home / Self Care | Payer: Self-pay | Source: Home / Self Care | Attending: Internal Medicine

## 2018-05-15 DIAGNOSIS — Z1211 Encounter for screening for malignant neoplasm of colon: Secondary | ICD-10-CM | POA: Diagnosis not present

## 2018-05-15 DIAGNOSIS — Z79890 Hormone replacement therapy (postmenopausal): Secondary | ICD-10-CM | POA: Insufficient documentation

## 2018-05-15 DIAGNOSIS — D122 Benign neoplasm of ascending colon: Secondary | ICD-10-CM | POA: Diagnosis not present

## 2018-05-15 DIAGNOSIS — M5136 Other intervertebral disc degeneration, lumbar region: Secondary | ICD-10-CM | POA: Insufficient documentation

## 2018-05-15 DIAGNOSIS — Z79899 Other long term (current) drug therapy: Secondary | ICD-10-CM | POA: Insufficient documentation

## 2018-05-15 DIAGNOSIS — K5730 Diverticulosis of large intestine without perforation or abscess without bleeding: Secondary | ICD-10-CM | POA: Diagnosis not present

## 2018-05-15 DIAGNOSIS — Z7982 Long term (current) use of aspirin: Secondary | ICD-10-CM | POA: Diagnosis not present

## 2018-05-15 DIAGNOSIS — E039 Hypothyroidism, unspecified: Secondary | ICD-10-CM | POA: Insufficient documentation

## 2018-05-15 DIAGNOSIS — R1031 Right lower quadrant pain: Secondary | ICD-10-CM

## 2018-05-15 DIAGNOSIS — R112 Nausea with vomiting, unspecified: Secondary | ICD-10-CM

## 2018-05-15 DIAGNOSIS — Z8673 Personal history of transient ischemic attack (TIA), and cerebral infarction without residual deficits: Secondary | ICD-10-CM | POA: Insufficient documentation

## 2018-05-15 DIAGNOSIS — Z7951 Long term (current) use of inhaled steroids: Secondary | ICD-10-CM | POA: Insufficient documentation

## 2018-05-15 HISTORY — PX: COLONOSCOPY: SHX5424

## 2018-05-15 HISTORY — DX: Other specified postprocedural states: Z98.890

## 2018-05-15 HISTORY — DX: Hypothyroidism, unspecified: E03.9

## 2018-05-15 HISTORY — PX: POLYPECTOMY: SHX5525

## 2018-05-15 HISTORY — PX: BIOPSY: SHX5522

## 2018-05-15 HISTORY — DX: Other specified postprocedural states: R11.2

## 2018-05-15 SURGERY — COLONOSCOPY
Anesthesia: Moderate Sedation

## 2018-05-15 MED ORDER — ONDANSETRON HCL 4 MG/2ML IJ SOLN
INTRAMUSCULAR | Status: AC
  Filled 2018-05-15: qty 2

## 2018-05-15 MED ORDER — MEPERIDINE HCL 100 MG/ML IJ SOLN
INTRAMUSCULAR | Status: AC
  Filled 2018-05-15: qty 1

## 2018-05-15 MED ORDER — MIDAZOLAM HCL 5 MG/5ML IJ SOLN
INTRAMUSCULAR | Status: AC
  Filled 2018-05-15: qty 10

## 2018-05-15 MED ORDER — ONDANSETRON HCL 4 MG/2ML IJ SOLN
INTRAMUSCULAR | Status: DC | PRN
  Administered 2018-05-15: 4 mg via INTRAVENOUS

## 2018-05-15 MED ORDER — SODIUM CHLORIDE 0.9 % IV SOLN
INTRAVENOUS | Status: DC
  Administered 2018-05-15: 1000 mL via INTRAVENOUS

## 2018-05-15 MED ORDER — MEPERIDINE HCL 100 MG/ML IJ SOLN
INTRAMUSCULAR | Status: DC | PRN
  Administered 2018-05-15: 25 mg
  Administered 2018-05-15: 15 mg

## 2018-05-15 MED ORDER — MIDAZOLAM HCL 5 MG/5ML IJ SOLN
INTRAMUSCULAR | Status: DC | PRN
  Administered 2018-05-15 (×2): 1 mg via INTRAVENOUS
  Administered 2018-05-15: 2 mg via INTRAVENOUS
  Administered 2018-05-15: 1 mg via INTRAVENOUS
  Administered 2018-05-15: 2 mg via INTRAVENOUS

## 2018-05-15 NOTE — Op Note (Signed)
[facility] Patient Name: [name] Procedure Date: 05/15/2018 11:32 AM MRN: [id number] Date of Birth: 10-29-1965 Attending MD: Norvel Richards , MD CSN: [id number] Age: 53 Admit Type: Outpatient Procedure:                Colonoscopy Indications:              Screening for colorectal malignant neoplasm Providers:                Norvel Richards, MD, Rosina Lowenstein, RN, Aram Candela Referring MD:              Medicines:                Midazolam 7 mg IV, Meperidine 40 mg IV Complications:            No immediate complications. Estimated Blood Loss:     Estimated blood loss: none. Estimated blood loss                            was minimal. Procedure:                Pre-Anesthesia Assessment:                           - Prior to the procedure, a History and Physical                            was performed, and patient medications and                            allergies were reviewed. The patient's tolerance of                            previous anesthesia was also reviewed. The risks                            and benefits of the procedure and the sedation                            options and risks were discussed with the patient.                            All questions were answered, and informed consent                            was obtained. Prior Anticoagulants: The patient has                            taken no previous anticoagulant or antiplatelet                            agents. After reviewing the risks and benefits, the  patient was deemed in satisfactory condition to                            undergo the procedure.                           After obtaining informed consent, the colonoscope                            was passed under direct vision. Throughout the                            procedure, the patient's blood pressure, pulse, and                            oxygen saturations were  monitored continuously. The                            [id number] ([id number]) scope was introduced through                            the anus and advanced to the the cecum, identified                            by appendiceal orifice and ileocecal valve. Scope In: 12:00:16 PM Scope Out: 12:18:39 PM Scope Withdrawal Time: 0 hours 14 minutes 4 seconds  Total Procedure Duration: 0 hours 18 minutes 23 seconds  Findings:      The perianal and digital rectal examinations were normal. The distal 10       cm of terminal ileum appeared normal.      Scattered small-mouthed diverticula were found in the sigmoid colon and       descending colon.      A 5 mm polyp was found in the ascending colon. The polyp was sessile.       The polyp was removed with a cold snare. Resection and retrieval were       complete. Estimated blood loss was minimal.      A 3 mm polyp was found in the ascending colon. The polyp was sessile.       The polyp was removed with a cold snare. Resection and retrieval were       complete. Estimated blood loss was minimal.      The exam was otherwise without abnormality on direct and retroflexion       views. Impression:               - Diverticulosis in the sigmoid colon and in the                            descending colon.                           - One 5 mm polyp in the ascending colon, removed                            with a cold snare. Resected and retrieved.                           -  One 3 mm polyp in the ascending colon, removed                            with a cold snare. Resected and retrieved.                           - The examination was otherwise normal on direct                            and retroflexion views. Moderate Sedation:      Moderate (conscious) sedation was administered by the endoscopy nurse       and supervised by the endoscopist. The following parameters were       monitored: oxygen saturation, heart rate, blood pressure, respiratory        rate, EKG, adequacy of pulmonary ventilation, and response to care.       Total physician intraservice time was 23 minutes. Recommendation:           - Patient has a contact number available for                            emergencies. The signs and symptoms of potential                            delayed complications were discussed with the                            patient. Return to normal activities tomorrow.                            Written discharge instructions were provided to the                            patient.                           - Resume previous diet.                           - Continue present medications.                           - Await pathology results.                           - Repeat colonoscopy date to be determined after                            pending pathology results are reviewed for                            surveillance based on pathology results.                           - Return to GI office (date not yet determined).  Contrast CT the abdomen felt pelvis to further                            evaluate right lower quadrant abdominal pain. Procedure Code(s):        --- Professional ---                           [phone], Moderate sedation services provided by the                            same physician or other qualified health care                            professional performing the diagnostic or                            therapeutic service that the sedation supports,                            requiring the presence of an independent trained                            observer to assist in the monitoring of the                            patient's level of consciousness and physiological                            status; initial 15 minutes of intraservice time,                            patient age 63 years or older                           [phone], Moderate sedation; each additional 15                             minutes intraservice time Diagnosis Code(s):        --- Professional ---                           Z12.11, Encounter for screening for malignant                            neoplasm of colon                           D12.2, Benign neoplasm of ascending colon                           K57.30, Diverticulosis of large intestine without                            perforation or abscess without bleeding CPT copyright 2018 American [employer] Association. All rights reserved. The codes documented in  this report are preliminary and upon coder review may  be revised to meet current compliance requirements. Cristopher Estimable. Jaeveon Ashland, MD Norvel Richards, MD 05/15/2018 12:32:25 PM This report has been signed electronically. Number of Addenda: 0

## 2018-05-15 NOTE — H&P (Signed)
@LOGO @   Primary Care Physician:  Doree Albee, MD Primary Gastroenterologist:  Dr.   Pre-Procedure History & Physical: HPI:  [name] is a 53 y.o. female is here for a screening colonoscopy.  No prior colonoscopy.  Has had intermittent right lower quadrant abdominal pain and nausea and vomiting of uncertain etiology.  No cross-sectional imaging recently.  No lower GI tract symptoms.  No family history of colon cancer.  Past Medical History:  Diagnosis Date  . DDD (degenerative disc disease), lumbar 01/19/2016  . History of TIA (transient ischemic attack) 01/19/2016  . Hypothyroidism   . Osteoarthritis of both feet 01/19/2016  . Osteoarthritis of both knees 01/19/2016  . PONV (postoperative nausea and vomiting)   . Stroke [facility]) 07/2010   "TIA" per patient    Past Surgical History:  Procedure Laterality Date  . ABDOMINAL HYSTERECTOMY    . CHOLECYSTECTOMY    . EYE SURGERY    . KNEE ARTHROSCOPY     x2  . LAPAROSCOPIC ASSISTED VAGINAL HYSTERECTOMY  10/11/2011   Procedure: LAPAROSCOPIC ASSISTED VAGINAL HYSTERECTOMY;  Surgeon: Margarette Asal, MD;  Location: [facility];  Service: Gynecology;  Laterality: N/A;  . LASIK      Prior to Admission medications   Medication Sig Start Date End Date Taking? Authorizing Provider  aspirin 325 MG EC tablet Take 325 mg by mouth daily.   Yes [provider]  baclofen (LIORESAL) 10 MG tablet TAKE 1 TABLET BY MOUTH EVERY DAY AS NEEDED FOR MUSCLE SPASMS Patient taking differently: Take 10 mg by mouth 2 (two) times daily as needed for muscle spasms. TAKE 1 TABLET BY MOUTH EVERY DAY AS NEEDED FOR MUSCLE SPASMS 04/26/18  Yes Deveshwar, Abel Presto, MD  cholecalciferol (VITAMIN D) 1000 units tablet Take 1,000 Units by mouth daily.   Yes [provider]  Misc Natural Products (TART CHERRY ADVANCED PO) Take 1 capsule by mouth daily.    Yes [provider]  pravastatin (PRAVACHOL) 10 MG tablet Take 10 mg by mouth every evening.   11/26/17  Yes [provider]  thyroid (ARMOUR) 60 MG tablet Take 60 mg by mouth daily before breakfast.    Yes [provider]  TURMERIC PO Take 1 capsule by mouth daily.   Yes [provider]  fluticasone (FLONASE) 50 MCG/ACT nasal spray Place 2 sprays into both nostrils daily as needed for allergies.     [provider]    Allergies as of 03/21/2018 - Review Complete 03/21/2018  Allergen Reaction Noted  . Celebrex [celecoxib] Swelling 10/02/2011    Family History  Problem Relation Age of Onset  . Anuerysm Mother   . Diabetes Father   . Hypertension Father   . Colon cancer Paternal Uncle     Social History   Socioeconomic History  . Marital status: Divorced    Spouse name: Not on file  . Number of children: Not on file  . Years of education: Not on file  . Highest education level: Not on file  Occupational History  . Not on file  Social Needs  . Financial resource strain: Not on file  . Food insecurity:    Worry: Not on file    Inability: Not on file  . Transportation needs:    Medical: Not on file    Non-medical: Not on file  Tobacco Use  . Smoking status: Never Smoker  . Smokeless tobacco: Never Used  Substance and Sexual Activity  . Alcohol use: Yes  Comment: occasionally  . Drug use: No  . Sexual activity: Not on file  Lifestyle  . Physical activity:    Days per week: Not on file    Minutes per session: Not on file  . Stress: Not on file  Relationships  . Social connections:    Talks on phone: Not on file    Gets together: Not on file    Attends religious service: Not on file    Active member of club or organization: Not on file    Attends meetings of clubs or organizations: Not on file    Relationship status: Not on file  . Intimate partner violence:    Fear of current or ex partner: Not on file    Emotionally abused: Not on file    Physically abused: Not on file    Forced sexual activity: Not on file  Other  Topics Concern  . Not on file  Social History Narrative  . Not on file    Review of Systems: See HPI, otherwise negative ROS  Physical Exam: BP (!) 111/59   Pulse (!) 56   Temp 98.1 F (36.7 C) (Oral)   Resp 14   Ht 5\' 2"  (1.575 m)   Wt 72.6 kg   LMP 01/09/2011   SpO2 100%   BMI 29.26 kg/m  General:   Alert,  Well-developed, well-nourished, pleasant and cooperative in NAD Lungs:  Clear throughout to auscultation.   No wheezes, crackles, or rhonchi. No acute distress. Heart:  Regular rate and rhythm; no murmurs, clicks, rubs,  or gallops. Abdomen:  Soft, nontender and nondistended. No masses, hepatosplenomegaly or hernias noted. Normal bowel sounds, without guarding, and without rebound.    Impression/Plan: [name] is now here to undergo a screening colonoscopy.  First ever average rescreening examination.  Risks, benefits, limitations, imponderables and alternatives regarding colonoscopy have been reviewed with the patient. Questions have been answered. All parties agreeable.     Notice:  This dictation was prepared with Dragon dictation along with smaller phrase technology. Any transcriptional errors that result from this process are unintentional and may not be corrected upon review.

## 2018-05-15 NOTE — Discharge Instructions (Signed)
Colonoscopy Discharge Instructions  Read the instructions outlined below and refer to this sheet in the next few weeks. These discharge instructions provide you with general information on caring for yourself after you leave the hospital. Your doctor may also give you specific instructions. While your treatment has been planned according to the most current medical practices available, unavoidable complications occasionally occur. If you have any problems or questions after discharge, call Dr. Gala Romney at [phone]. ACTIVITY  You may resume your regular activity, but move at a slower pace for the next 24 hours.   Take frequent rest periods for the next 24 hours.   Walking will help get rid of the air and reduce the bloated feeling in your belly (abdomen).   No driving for 24 hours (because of the medicine (anesthesia) used during the test).    Do not sign any important legal documents or operate any machinery for 24 hours (because of the anesthesia used during the test).  NUTRITION  Drink plenty of fluids.   You may resume your normal diet as instructed by your doctor.   Begin with a light meal and progress to your normal diet. Heavy or fried foods are harder to digest and may make you feel sick to your stomach (nauseated).   Avoid alcoholic beverages for 24 hours or as instructed.  MEDICATIONS  You may resume your normal medications unless your doctor tells you otherwise.  WHAT YOU CAN EXPECT TODAY  Some feelings of bloating in the abdomen.   Passage of more gas than usual.   Spotting of blood in your stool or on the toilet paper.  IF YOU HAD POLYPS REMOVED DURING THE COLONOSCOPY:  No aspirin products for 7 days or as instructed.   No alcohol for 7 days or as instructed.   Eat a soft diet for the next 24 hours.  FINDING OUT THE RESULTS OF YOUR TEST Not all test results are available during your visit. If your test results are not back during the visit, make an appointment  with your caregiver to find out the results. Do not assume everything is normal if you have not heard from your caregiver or the medical facility. It is important for you to follow up on all of your test results.  SEEK IMMEDIATE MEDICAL ATTENTION IF:  You have more than a spotting of blood in your stool.   Your belly is swollen (abdominal distention).   You are nauseated or vomiting.   You have a temperature over 101.   You have abdominal pain or discomfort that is severe or gets worse throughout the day.    Polyp and diverticulosis information provided  No explanation for your right-sided abdominal pain found today.  Recommend contrast CT of the abdomen and pelvis on another day to further evaluate your symptoms.  Further recommendations once results of testing back for review.  Colonoscopy, Adult, Care After This sheet gives you information about how to care for yourself after your procedure. Your doctor may also give you more specific instructions. If you have problems or questions, call your doctor. What can I expect after the procedure? After the procedure, it is common to have:  A small amount of blood in your poop for 24 hours.  Some gas.  Mild cramping or bloating in your belly. Follow these instructions at home: General instructions  For the first 24 hours after the procedure: ? Do not drive or use machinery. ? Do not sign important documents. ? Do not drink  alcohol. ? Do your daily activities more slowly than normal. ? Eat foods that are soft and easy to digest.  Take over-the-counter or prescription medicines only as told by your doctor. To help cramping and bloating:   Try walking around.  Put heat on your belly (abdomen) as told by your doctor. Use a heat source that your doctor recommends, such as a moist heat pack or a heating pad. ? Put a towel between your skin and the heat source. ? Leave the heat on for 20-30 minutes. ? Remove the heat if your  skin turns bright red. This is especially important if you cannot feel pain, heat, or cold. You can get burned. Eating and drinking   Drink enough fluid to keep your pee (urine) clear or pale yellow.  Return to your normal diet as told by your doctor. Avoid heavy or fried foods that are hard to digest.  Avoid drinking alcohol for as long as told by your doctor. Contact a doctor if:  You have blood in your poop (stool) 2-3 days after the procedure. Get help right away if:  You have more than a small amount of blood in your poop.  You see large clumps of tissue (blood clots) in your poop.  Your belly is swollen.  You feel sick to your stomach (nauseous).  You throw up (vomit).  You have a fever.  You have belly pain that gets worse, and medicine does not help your pain. Summary  After the procedure, it is common to have a small amount of blood in your poop. You may also have mild cramping and bloating in your belly.  For the first 24 hours after the procedure, do not drive or use machinery, do not sign important documents, and do not drink alcohol.  Get help right away if you have a lot of blood in your poop, feel sick to your stomach, have a fever, or have more belly pain. This information is not intended to replace advice given to you by your health care provider. Make sure you discuss any questions you have with your health care provider. Document Released: 04/08/2010 Document Revised: 01/04/2017 Document Reviewed: 11/29/2015 Elsevier Interactive Patient Education  2019 [place].  Colon Polyps  Polyps are tissue growths inside the body. Polyps can grow in many places, including the large intestine (colon). A polyp may be a round bump or a mushroom-shaped growth. You could have one polyp or several. Most colon polyps are noncancerous (benign). However, some colon polyps can become cancerous over time. Finding and removing the polyps early can help prevent this. What are  the causes? The exact cause of colon polyps is not known. What increases the risk? You are more likely to develop this condition if you:  Have a family history of colon cancer or colon polyps.  Are older than 54 or older than 45 if you are African American.  Have inflammatory bowel disease, such as ulcerative colitis or Crohn's disease.  Have certain hereditary conditions, such as: ? Familial adenomatous polyposis. ? Lynch syndrome. ? Turcot syndrome. ? Peutz-Jeghers syndrome.  Are overweight.  Smoke cigarettes.  Do not get enough exercise.  Drink too much alcohol.  Eat a diet that is high in fat and red meat and low in fiber.  Had childhood cancer that was treated with abdominal radiation. What are the signs or symptoms? Most polyps do not cause symptoms. If you have symptoms, they may include:  Blood coming from your rectum when  having a bowel movement.  Blood in your stool. The stool may look dark red or black.  Abdominal pain.  A change in bowel habits, such as constipation or diarrhea. How is this diagnosed? This condition is diagnosed with a colonoscopy. This is a procedure in which a lighted, flexible scope is inserted into the anus and then passed into the colon to examine the area. Polyps are sometimes found when a colonoscopy is done as part of routine cancer screening tests. How is this treated? Treatment for this condition involves removing any polyps that are found. Most polyps can be removed during a colonoscopy. Those polyps will then be tested for cancer. Additional treatment may be needed depending on the results of testing. Follow these instructions at home: Lifestyle  Maintain a healthy weight, or lose weight if recommended by your health care provider.  Exercise every day or as told by your health care provider.  Do not use any products that contain nicotine or tobacco, such as cigarettes and e-cigarettes. If you need help quitting, ask your  health care provider.  If you drink alcohol, limit how much you have: ? 0-1 drink a day for women. ? 0-2 drinks a day for men.  Be aware of how much alcohol is in your drink. In the [place] one drink equals one 12 oz bottle of beer (355 mL), one 5 oz glass of wine (148 mL), or one 1 oz shot of hard liquor (44 mL). Eating and drinking   Eat foods that are high in fiber, such as fruits, vegetables, and whole grains.  Eat foods that are high in calcium and vitamin D, such as milk, cheese, yogurt, eggs, liver, fish, and broccoli.  Limit foods that are high in fat, such as fried foods and desserts.  Limit the amount of red meat and processed meat you eat, such as hot dogs, sausage, bacon, and lunch meats. General instructions  Keep all follow-up visits as told by your health care provider. This is important. ? This includes having regularly scheduled colonoscopies. ? Talk to your health care provider about when you need a colonoscopy. Contact a health care provider if:  You have new or worsening bleeding during a bowel movement.  You have new or increased blood in your stool.  You have a change in bowel habits.  You lose weight for no known reason. Summary  Polyps are tissue growths inside the body. Polyps can grow in many places, including the colon.  Most colon polyps are noncancerous (benign), but some can become cancerous over time.  This condition is diagnosed with a colonoscopy.  Treatment for this condition involves removing any polyps that are found. Most polyps can be removed during a colonoscopy. This information is not intended to replace advice given to you by your health care provider. Make sure you discuss any questions you have with your health care provider. Document Released: 12/01/2003 Document Revised: 06/21/2017 Document Reviewed: 06/21/2017 Elsevier Interactive Patient Education  2019 [employer].

## 2018-05-16 ENCOUNTER — Encounter: Payer: Self-pay | Admitting: Internal Medicine

## 2018-05-16 ENCOUNTER — Ambulatory Visit: Payer: 59 | Admitting: Physician Assistant

## 2018-05-16 DIAGNOSIS — M1711 Unilateral primary osteoarthritis, right knee: Secondary | ICD-10-CM | POA: Diagnosis not present

## 2018-05-16 MED ORDER — LIDOCAINE HCL 1 % IJ SOLN
1.5000 mL | INTRAMUSCULAR | Status: AC | PRN
  Administered 2018-05-16: 1.5 mL

## 2018-05-16 MED ORDER — SODIUM HYALURONATE (VISCOSUP) 20 MG/2ML IX SOSY
20.0000 mg | PREFILLED_SYRINGE | INTRA_ARTICULAR | Status: AC | PRN
  Administered 2018-05-16: 20 mg via INTRA_ARTICULAR

## 2018-05-16 NOTE — Progress Notes (Signed)
   Procedure Note  Patient: [name]             Date of Birth: 02-Jul-1965           MRN: [id number]             Visit Date: 05/16/2018  Procedures: Visit Diagnoses: Primary osteoarthritis of right knee Euflexxa #3 right knee joint injection B/B Large Joint Inj: R knee on 05/16/2018 12:48 PM Indications: pain Details: 27 G 1.5 in needle, medial approach  Arthrogram: No  Medications: 20 mg Sodium Hyaluronate 20 MG/2ML; 1.5 mL lidocaine 1 % Aspirate: 0 mL Outcome: tolerated well, no immediate complications Procedure, treatment alternatives, risks and benefits explained, specific risks discussed. Consent was given by the patient. Immediately prior to procedure a time out was called to verify the correct patient, procedure, equipment, support staff and site/side marked as required. Patient was prepped and draped in the usual sterile fashion.    Patient tolerated the procedure well.  Hazel Sams, PA-C

## 2018-05-17 ENCOUNTER — Encounter (HOSPITAL_COMMUNITY): Payer: Self-pay | Admitting: Internal Medicine

## 2018-05-22 ENCOUNTER — Other Ambulatory Visit: Payer: Self-pay | Admitting: Rheumatology

## 2018-05-22 NOTE — Telephone Encounter (Signed)
Last visit: 04/04/18 Next visit: 10/03/18  Okay to refill per Dr. Deveshwar 

## 2018-05-23 ENCOUNTER — Ambulatory Visit: Payer: 59 | Admitting: Cardiovascular Disease

## 2018-05-23 ENCOUNTER — Encounter: Payer: Self-pay | Admitting: Cardiovascular Disease

## 2018-05-23 VITALS — BP 112/68 | HR 52 | Ht 62.0 in | Wt 160.6 lb

## 2018-05-23 DIAGNOSIS — G459 Transient cerebral ischemic attack, unspecified: Secondary | ICD-10-CM | POA: Diagnosis not present

## 2018-05-23 DIAGNOSIS — G4730 Sleep apnea, unspecified: Secondary | ICD-10-CM | POA: Diagnosis not present

## 2018-05-23 DIAGNOSIS — R0789 Other chest pain: Secondary | ICD-10-CM | POA: Diagnosis not present

## 2018-05-23 DIAGNOSIS — R5383 Other fatigue: Secondary | ICD-10-CM | POA: Diagnosis not present

## 2018-05-23 DIAGNOSIS — E663 Overweight: Secondary | ICD-10-CM | POA: Diagnosis not present

## 2018-05-23 DIAGNOSIS — R001 Bradycardia, unspecified: Secondary | ICD-10-CM | POA: Diagnosis not present

## 2018-05-23 NOTE — Patient Instructions (Signed)
Your physician recommends that you schedule a follow-up appointment in: 4 MONTHS WITH DR KONESWARAN  Your physician recommends that you continue on your current medications as directed. Please refer to the Current Medication list given to you today.  Thank you for choosing [facility]!!    

## 2018-05-23 NOTE — Progress Notes (Signed)
SUBJECTIVE: The patient presents for routine follow-up of chest pain and bradycardia.  In summary, event monitoring demonstrated sinus rhythm, sinus bradycardia, and sinus tachycardia.  Symptoms correlated with sinus bradycardia.  There were no significant arrhythmias.  Exercise tolerance test was normal with no evidence of chronotropic incompetence.  It was negative for ischemia.  I reviewed the echocardiogram performed at [facility] earlier this year which demonstrated normal left ventricular systolic function, LVEF 60 to 65%.  Her PCP started her on a thyroid medication.  She thinks her energy levels have increased to some degree.  She is scheduled for a sleep study tomorrow.  She has had decreased frequency of palpitations.  They seem to get worse when there are increased levels of stress at work.  She works as a Hospital doctor for [employer].    Review of Systems: As per "subjective", otherwise negative.  Allergies  Allergen Reactions  . Celebrex [Celecoxib] Swelling    Facial swelling    Current Outpatient Medications  Medication Sig Dispense Refill  . aspirin 325 MG EC tablet Take 325 mg by mouth daily.    . baclofen (LIORESAL) 10 MG tablet TAKE 1 TABLET BY MOUTH EVERY DAY AS NEEDED FOR MUSCLE SPASMS 30 tablet 0  . cholecalciferol (VITAMIN D) 1000 units tablet Take 1,000 Units by mouth daily.    . fluticasone (FLONASE) 50 MCG/ACT nasal spray Place 2 sprays into both nostrils daily as needed for allergies.     . Misc Natural Products (TART CHERRY ADVANCED PO) Take 1 capsule by mouth daily.     . pravastatin (PRAVACHOL) 10 MG tablet Take 10 mg by mouth every evening.   0  . thyroid (ARMOUR) 60 MG tablet Take 60 mg by mouth daily before breakfast.     . TURMERIC PO Take 1 capsule by mouth daily.     No current facility-administered medications for this visit.     Past Medical History:  Diagnosis Date  . DDD (degenerative disc disease),  lumbar 01/19/2016  . History of TIA (transient ischemic attack) 01/19/2016  . Hypothyroidism   . Osteoarthritis of both feet 01/19/2016  . Osteoarthritis of both knees 01/19/2016  . PONV (postoperative nausea and vomiting)   . Stroke [facility]) 07/2010   "TIA" per patient    Past Surgical History:  Procedure Laterality Date  . ABDOMINAL HYSTERECTOMY    . BIOPSY  05/15/2018   Procedure: BIOPSY;  Surgeon: Daneil Dolin, MD;  Location: [facility];  Service: Endoscopy;;  polyp cb  . CHOLECYSTECTOMY    . COLONOSCOPY N/A 05/15/2018   Procedure: COLONOSCOPY;  Surgeon: Daneil Dolin, MD;  Location: [facility];  Service: Endoscopy;  Laterality: N/A;  12:00pm  . EYE SURGERY    . KNEE ARTHROSCOPY     x2  . LAPAROSCOPIC ASSISTED VAGINAL HYSTERECTOMY  10/11/2011   Procedure: LAPAROSCOPIC ASSISTED VAGINAL HYSTERECTOMY;  Surgeon: Margarette Asal, MD;  Location: [facility];  Service: Gynecology;  Laterality: N/A;  . LASIK    . POLYPECTOMY  05/15/2018   Procedure: POLYPECTOMY;  Surgeon: Daneil Dolin, MD;  Location: [facility];  Service: Endoscopy;;  colon     Social History   Socioeconomic History  . Marital status: Divorced    Spouse name: Not on file  . Number of children: Not on file  . Years of education: Not on file  . Highest education level: Not on file  Occupational History  . Not  on file  Social Needs  . Financial resource strain: Not on file  . Food insecurity:    Worry: Not on file    Inability: Not on file  . Transportation needs:    Medical: Not on file    Non-medical: Not on file  Tobacco Use  . Smoking status: Never Smoker  . Smokeless tobacco: Never Used  Substance and Sexual Activity  . Alcohol use: Yes    Comment: occasionally  . Drug use: No  . Sexual activity: Not on file  Lifestyle  . Physical activity:    Days per week: Not on file    Minutes per session: Not on file  . Stress: Not on file  Relationships  . Social connections:    Talks on phone: Not  on file    Gets together: Not on file    Attends religious service: Not on file    Active member of club or organization: Not on file    Attends meetings of clubs or organizations: Not on file    Relationship status: Not on file  . Intimate partner violence:    Fear of current or ex partner: Not on file    Emotionally abused: Not on file    Physically abused: Not on file    Forced sexual activity: Not on file  Other Topics Concern  . Not on file  Social History Narrative  . Not on file     Vitals:   05/23/18 0835  BP: 112/68  Pulse: (!) 52  SpO2: 100%  Weight: 160 lb 9.6 oz (72.8 kg)  Height: 5\' 2"  (1.575 m)    Wt Readings from Last 3 Encounters:  05/23/18 160 lb 9.6 oz (72.8 kg)  05/15/18 160 lb (72.6 kg)  04/04/18 163 lb (73.9 kg)     PHYSICAL EXAM General: NAD HEENT: Normal. Neck: No JVD, no thyromegaly. Lungs: Clear to auscultation bilaterally with normal respiratory effort. CV: Bradycardic, regular rhythm, normal S1/S2, no S3/S4, no murmur. No pretibial or periankle edema.  No carotid bruit.   Abdomen: Soft, nontender, no distention.  Neurologic: Alert and oriented.  Psych: Normal affect. Skin: Normal. Musculoskeletal: No gross deformities.    ECG: Reviewed above under Subjective   Labs: Lab Results  Component Value Date/Time   K 4.9 07/13/2016 09:18 AM   BUN 11 07/13/2016 09:18 AM   CREATININE 0.61 07/13/2016 09:18 AM   ALT 13 07/13/2016 09:18 AM   HGB 13.2 07/13/2016 09:18 AM     Lipids: No results found for: LDLCALC, LDLDIRECT, CHOL, TRIG, HDL     ASSESSMENT AND PLAN:  1. Bradycardia with dizziness and fatigue: Event monitoring was unremarkable.  She was bradycardic during sleep which is normal.Thyroid function is normal. Her PCP has started her on thyroid supplementation and she has been on this for at least 3 months.  She is not on any AV nodal blocking agents to explain bradycardia.  There was no evidence of chronotropic incompetence  with exercise tolerance testing. She has sleep disordered breathing which can lead to bradycardia and fatigue.  She is now followed by a sleep specialist.  She is scheduled for a sleep study tomorrow.  2.   History of TIA: Currently on aspirin 325 mg and pravastatin 10 mg.  3. Abnormal ECG with chest tightness: Symptoms are atypical in that they can occur both at rest and with exertion.   They have improved. Echocardiogram and exercise tolerance test are unremarkable.  No further cardiac testing is indicated  at this time.    Disposition: Follow up 4 months   Kate Sable, M.D., F.A.C.C.

## 2018-05-24 ENCOUNTER — Ambulatory Visit (INDEPENDENT_AMBULATORY_CARE_PROVIDER_SITE_OTHER): Payer: 59 | Admitting: Neurology

## 2018-05-24 DIAGNOSIS — G4710 Hypersomnia, unspecified: Secondary | ICD-10-CM

## 2018-05-24 DIAGNOSIS — G4719 Other hypersomnia: Secondary | ICD-10-CM

## 2018-05-24 DIAGNOSIS — R002 Palpitations: Secondary | ICD-10-CM

## 2018-05-24 DIAGNOSIS — R51 Headache: Secondary | ICD-10-CM

## 2018-05-24 DIAGNOSIS — R001 Bradycardia, unspecified: Secondary | ICD-10-CM

## 2018-05-24 DIAGNOSIS — Z8673 Personal history of transient ischemic attack (TIA), and cerebral infarction without residual deficits: Secondary | ICD-10-CM

## 2018-05-24 DIAGNOSIS — E669 Obesity, unspecified: Secondary | ICD-10-CM

## 2018-05-24 DIAGNOSIS — R519 Headache, unspecified: Secondary | ICD-10-CM

## 2018-05-24 DIAGNOSIS — R0683 Snoring: Secondary | ICD-10-CM

## 2018-05-30 ENCOUNTER — Telehealth: Payer: Self-pay

## 2018-05-30 NOTE — Telephone Encounter (Signed)
-----   Message from [name], MD sent at 05/30/2018  1:46 PM EDT ----- Pt was referred by cardiologist, sleep study from 05/24/18 showed benign results, some intermittent snoring, no OSA, no abnormal leg movements, slept fairly well.  Can FU with PCP and cardiologist.  Please notify pt.  [name]

## 2018-05-30 NOTE — Progress Notes (Signed)
Pt was referred by cardiologist, sleep study from 05/24/18 showed benign results, some intermittent snoring, no OSA, no abnormal leg movements, slept fairly well.  Can FU with PCP and cardiologist.  Please notify pt.  Michel Bickers

## 2018-05-30 NOTE — Telephone Encounter (Signed)
I called pt to discuss her sleep study results. No answer, left a message asking her to call me back. 

## 2018-05-30 NOTE — Telephone Encounter (Signed)
Pt returned my call and I discuss her results with her. Pt verbalized understanding of results and recommendations. Pt had no questions at this time but was encouraged to call back if questions arise.

## 2018-05-30 NOTE — Procedures (Signed)
PATIENT'S NAME:  [name], [name] DOB:      1966-02-22      MR#:    [id number]     DATE OF RECORDING: 05/24/2018 REFERRING M.D.:  Kate Sable, MD Study Performed:   Baseline Polysomnogram HISTORY: 53 year old woman with a history of degenerative lumbar spine disease, history of TIA, osteoarthritis, palpitations, symptomatic bradycardia and borderline obesity, who reports snoring and excessive daytime somnolence. Her Epworth sleepiness score is 8 out of 24 today. The patient's weight 165 pounds with a height of 62 (inches), resulting in a BMI of 30.4 kg/m2. The patient's neck circumference measured 13.5 inches.  CURRENT MEDICATIONS: Aspirin, Lioresal, Vitamin D, Flonase, Pravachol, Armour.   PROCEDURE:  This is a multichannel digital polysomnogram utilizing the Somnostar 11.2 system.  Electrodes and sensors were applied and monitored per AASM Specifications.   EEG, EOG, Chin and Limb EMG, were sampled at 200 Hz.  ECG, Snore and Nasal Pressure, Thermal Airflow, Respiratory Effort, CPAP Flow and Pressure, Oximetry was sampled at 50 Hz. Digital video and audio were recorded.      BASELINE STUDY  Lights Out was at 21:07 and Lights On at 05:02.  Total recording time (TRT) was 475 minutes, with a total sleep time (TST) of 433 minutes.   The patient's sleep latency was 27.5 minutes. REM latency was 94 minutes, which is normal. The sleep efficiency was 91.2 %.     SLEEP ARCHITECTURE: WASO (Wake after sleep onset) was 18.5 minutes with no significant sleep fragmentation noted.  There were 4 minutes in Stage N1, 296.5 minutes Stage N2, 31 minutes Stage N3 and 101.5 minutes in Stage REM.  The percentage of Stage N1 was .9%, Stage N2 was 68.5%, which is increased, Stage N3 was 7.2% and Stage R (REM sleep) was 23.4%, which is normal. The arousals were noted as: 22 were spontaneous, 5 were associated with PLMs, 3 were associated with respiratory events.  RESPIRATORY ANALYSIS:  There were a total of 6  respiratory events:  2 obstructive apneas, 0 central apneas and 0 mixed apneas with a total of 2 apneas and an apnea index (AI) of .3 /hour. There were 4 hypopneas with a hypopnea index of .6 /hour. The patient also had 0 respiratory event related arousals (RERAs).      The total APNEA/HYPOPNEA INDEX (AHI) was .8 /hour and the total RESPIRATORY DISTURBANCE INDEX was 0. .8 /hour.  1 events occurred in REM sleep and 6 events in NREM. The REM AHI was 0. .6 /hour, versus a non-REM AHI of .9. The patient spent 202 minutes of total sleep time in the supine position and 231 minutes in non-supine. The supine AHI was 1.8 versus a non-supine AHI of 0.0.  OXYGEN SATURATION & C02:  The Wake baseline 02 saturation was 98%, with the lowest being 86%. Time spent below 89% saturation equaled 1 minutes.  PERIODIC LIMB MOVEMENTS: The patient had a total of 5 Periodic Limb Movements.  The Periodic Limb Movement (PLM) index was .7 and the PLM Arousal index was .7/hour.  Audio and video analysis did not show any abnormal or unusual movements, behaviors, phonations or vocalizations. The patient took no bathroom breaks. Mild intermittent snoring was noted. The EKG was in keeping with normal sinus rhythm (NSR).  Post-study, the patient indicated that sleep was the same as usual.   IMPRESSION:  1. Primary Snoring  RECOMMENDATIONS:  1. This study does not demonstrate any significant obstructive or central sleep disordered breathing. Mild intermittent snoring was noted.  This study does not support an intrinsic sleep disorder as a cause of the patient's symptoms. Other causes, including circadian rhythm disturbances, an underlying mood disorder, medication effect and/or an underlying medical problem cannot be ruled out. 2. The patient should be cautioned not to drive, work at heights, or operate dangerous or heavy equipment when tired or sleepy. Review and reiteration of good sleep hygiene measures should be pursued with any  patient. 3. The patient will be advised to follow up with the referring provider, who will be notified of the test results.  I certify that I have reviewed the entire raw data recording prior to the issuance of this report in accordance with the Standards of Accreditation of the American Academy of Sleep Medicine (AASM)   [name], MD, PhD Diplomat, American Board of Neurology and Sleep Medicine (Neurology and Sleep Medicine)

## 2018-06-14 ENCOUNTER — Other Ambulatory Visit: Payer: Self-pay | Admitting: Rheumatology

## 2018-06-14 NOTE — Telephone Encounter (Signed)
Last visit: 04/04/18 Next visit: 10/03/18  Okay to refill per Dr. Deveshwar 

## 2018-07-11 ENCOUNTER — Other Ambulatory Visit: Payer: Self-pay | Admitting: Rheumatology

## 2018-07-11 NOTE — Telephone Encounter (Signed)
Last visit: 04/04/18 Next visit: 10/03/18  Okay to refill per Dr. Estanislado Pandy

## 2018-07-19 ENCOUNTER — Other Ambulatory Visit: Payer: Self-pay | Admitting: Rheumatology

## 2018-08-06 ENCOUNTER — Other Ambulatory Visit: Payer: Self-pay | Admitting: Rheumatology

## 2018-08-06 NOTE — Telephone Encounter (Signed)
Last visit: 04/04/18 Next visit: 10/03/18  Okay to refill per Dr. Estanislado Pandy

## 2018-08-18 ENCOUNTER — Other Ambulatory Visit: Payer: Self-pay | Admitting: Rheumatology

## 2018-08-19 NOTE — Telephone Encounter (Signed)
Last visit: 04/04/18 Next visit: 10/03/18  Okay to refill per Dr. Estanislado Pandy

## 2018-08-22 ENCOUNTER — Ambulatory Visit (INDEPENDENT_AMBULATORY_CARE_PROVIDER_SITE_OTHER): Payer: 59 | Admitting: Internal Medicine

## 2018-09-12 ENCOUNTER — Telehealth: Payer: Self-pay | Admitting: Cardiovascular Disease

## 2018-09-12 NOTE — Telephone Encounter (Signed)
Virtual Visit Pre-Appointment Phone Call  "(Name), I am calling you today to discuss your upcoming appointment. We are currently trying to limit exposure to the virus that causes COVID-19 by seeing patients at home rather than in the office."  1. "What is the BEST phone number to call the day of the visit?" - include this in appointment notes  2. Do you have or have access to (through a family member/friend) a smartphone with video capability that we can use for your visit?" a. If yes - list this number in appt notes as cell (if different from BEST phone #) and list the appointment type as a VIDEO visit in appointment notes b. If no - list the appointment type as a PHONE visit in appointment notes  3. Confirm consent - "In the setting of the current Covid19 crisis, you are scheduled for a (phone or video) visit with your provider on (date) at (time).  Just as we do with many in-office visits, in order for you to participate in this visit, we must obtain consent.  If you'd like, I can send this to your mychart (if signed up) or email for you to review.  Otherwise, I can obtain your verbal consent now.  All virtual visits are billed to your insurance company just like a normal visit would be.  By agreeing to a virtual visit, we'd like you to understand that the technology does not allow for your provider to perform an examination, and thus may limit your provider's ability to fully assess your condition. If your provider identifies any concerns that need to be evaluated in person, we will make arrangements to do so.  Finally, though the technology is pretty good, we cannot assure that it will always work on either your or our end, and in the setting of a video visit, we may have to convert it to a phone-only visit.  In either situation, we cannot ensure that we have a secure connection.  Are you willing to proceed?" STAFF: Did the patient verbally acknowledge consent to telehealth visit? Document  YES/NO here: yes 4.   5. Advise patient to be prepared - "Two hours prior to your appointment, go ahead and check your blood pressure, pulse, oxygen saturation, and your weight (if you have the equipment to check those) and write them all down. When your visit starts, your provider will ask you for this information. If you have an Apple Watch or Kardia device, please plan to have heart rate information ready on the day of your appointment. Please have a pen and paper handy nearby the day of the visit as well."  6. Give patient instructions for MyChart download to smartphone OR Doximity/Doxy.me as below if video visit (depending on what platform provider is using)  7. Inform patient they will receive a phone call 15 minutes prior to their appointment time (may be from unknown caller ID) so they should be prepared to answer    TELEPHONE CALL NOTE  [name] has been deemed a candidate for a follow-up tele-health visit to limit community exposure during the Covid-19 pandemic. I spoke with the patient via phone to ensure availability of phone/video source, confirm preferred email & phone number, and discuss instructions and expectations.  I reminded [name] to be prepared with any vital sign and/or heart rhythm information that could potentially be obtained via home monitoring, at the time of her visit. I reminded [name] to expect a phone call  prior to her visit.  [name] 09/12/2018 1:31 PM   INSTRUCTIONS FOR DOWNLOADING THE MYCHART APP TO SMARTPHONE  - The patient must first make sure to have activated MyChart and know their login information - If Apple, go to [employer] and type in MyChart in the search bar and download the app. If Android, ask patient to go to [employer] and type in [place] in the search bar and download the app. The app is free but as with any other app downloads, their phone may require them to verify saved payment information or  Apple/Android password.  - The patient will need to then log into the app with their MyChart username and password, and select [facility] as their healthcare provider to link the account. When it is time for your visit, go to the MyChart app, find appointments, and click Begin Video Visit. Be sure to Select Allow for your device to access the Microphone and Camera for your visit. You will then be connected, and your provider will be with you shortly.  **If they have any issues connecting, or need assistance please contact MyChart service desk [phone])**  **If using a computer, in order to ensure the best quality for their visit they will need to use either of the following Internet Browsers: [employer], or Google Chrome**  IF USING DOXIMITY or DOXY.ME - The patient will receive a link just prior to their visit by text.     FULL LENGTH CONSENT FOR TELE-HEALTH VISIT   I hereby voluntarily request, consent and authorize [facility] and its employed or contracted physicians, physician assistants, nurse practitioners or other licensed health care professionals (the Practitioner), to provide me with telemedicine health care services (the Services") as deemed necessary by the treating Practitioner. I acknowledge and consent to receive the Services by the Practitioner via telemedicine. I understand that the telemedicine visit will involve communicating with the Practitioner through live audiovisual communication technology and the disclosure of certain medical information by electronic transmission. I acknowledge that I have been given the opportunity to request an in-person assessment or other available alternative prior to the telemedicine visit and am voluntarily participating in the telemedicine visit.  I understand that I have the right to withhold or withdraw my consent to the use of telemedicine in the course of my care at any time, without affecting my right to future care  or treatment, and that the Practitioner or I may terminate the telemedicine visit at any time. I understand that I have the right to inspect all information obtained and/or recorded in the course of the telemedicine visit and may receive copies of available information for a reasonable fee.  I understand that some of the potential risks of receiving the Services via telemedicine include:   Delay or interruption in medical evaluation due to technological equipment failure or disruption;  Information transmitted may not be sufficient (e.g. poor resolution of images) to allow for appropriate medical decision making by the Practitioner; and/or   In rare instances, security protocols could fail, causing a breach of personal health information.  Furthermore, I acknowledge that it is my responsibility to provide information about my medical history, conditions and care that is complete and accurate to the best of my ability. I acknowledge that Practitioner's advice, recommendations, and/or decision may be based on factors not within their control, such as incomplete or inaccurate data provided by me or distortions of diagnostic images or specimens that may result from electronic  transmissions. I understand that the practice of medicine is not an exact science and that Practitioner makes no warranties or guarantees regarding treatment outcomes. I acknowledge that I will receive a copy of this consent concurrently upon execution via email to the email address I last provided but may also request a printed copy by calling the office of [facility].    I understand that my insurance will be billed for this visit.   I have read or had this consent read to me.  I understand the contents of this consent, which adequately explains the benefits and risks of the Services being provided via telemedicine.   I have been provided ample opportunity to ask questions regarding this consent and the Services and have had  my questions answered to my satisfaction.  I give my informed consent for the services to be provided through the use of telemedicine in my medical care  By participating in this telemedicine visit I agree to the above.

## 2018-09-17 ENCOUNTER — Other Ambulatory Visit: Payer: Self-pay | Admitting: Rheumatology

## 2018-09-17 NOTE — Telephone Encounter (Signed)
Last visit: 04/04/18 Next visit: 10/03/18  Okay to refill per Dr. Estanislado Pandy

## 2018-09-24 NOTE — Progress Notes (Signed)
Office Visit Note  Patient: [name]             Date of Birth: [birth year]           MRN: [id number]             PCP: Doree Albee, MD Referring: Doree Albee, MD Visit Date: 10/03/2018 Occupation: @GUAROCC @  Subjective:  Pain in right knee.   History of Present Illness: [name] is a 53 y.o. female with history of osteoarthritis.  She states she continues to have pain and discomfort in her right knee joint.  The pain has increased recently.  She states she has been working through home and has been sitting and typing for multiple hours.  She has significant stiffness in the morning.  She has difficulty walking.  She also complains of some discomfort in the popliteal region.  Left knee joint is not as painful.  She has some underlying osteoarthritis in her hands and feet which is not as bothersome.  She has some lower back pain which is due to underlying disc disease.  She denies any recurrence of nodular episcleritis.  Activities of Daily Living:  Patient reports morning stiffness for 30 minutes.   Patient Reports nocturnal pain.  Difficulty dressing/grooming: Denies Difficulty climbing stairs: Reports Difficulty getting out of chair: Reports Difficulty using hands for taps, buttons, cutlery, and/or writing: Denies  Review of Systems  Constitutional: Positive for fatigue.  HENT: Negative for mouth sores, mouth dryness and nose dryness.   Eyes: Negative for itching and dryness.  Respiratory: Negative for shortness of breath, wheezing and difficulty breathing.   Cardiovascular: Negative for palpitations, irregular heartbeat and swelling in legs/feet.  Gastrointestinal: Negative for abdominal pain, constipation and diarrhea.  Endocrine: Negative for increased urination.  Genitourinary: Negative for painful urination and pelvic pain.  Musculoskeletal: Positive for arthralgias, joint pain, joint swelling and morning stiffness.  Skin: Negative for rash and  redness.  Allergic/Immunologic: Negative for susceptible to infections.  Neurological: Positive for weakness. Negative for dizziness, light-headedness, headaches and memory loss.  Hematological: Positive for bruising/bleeding tendency.  Psychiatric/Behavioral: Negative for confusion. The patient is not nervous/anxious.     PMFS History:  Patient Active Problem List   Diagnosis Date Noted  . Abdominal pain 03/21/2018  . Nausea with vomiting 03/21/2018  . Primary osteoarthritis of both knees 09/24/2017  . Osteoarthritis of both knees 01/19/2016  . Osteoarthritis of both feet 01/19/2016  . History of TIA (transient ischemic attack) 01/19/2016  . DDD (degenerative disc disease), lumbar 01/19/2016  . Nodular episcleritis of both eyes 01/19/2016  . Pain in right knee 01/19/2016  . Leiomyoma 10/12/2011  . Female pelvic pain 10/12/2011    Past Medical History:  Diagnosis Date  . DDD (degenerative disc disease), lumbar 01/19/2016  . History of TIA (transient ischemic attack) 01/19/2016  . Hypothyroidism   . Osteoarthritis of both feet 01/19/2016  . Osteoarthritis of both knees 01/19/2016  . PONV (postoperative nausea and vomiting)   . Stroke [facility]) 07/2010   "TIA" per patient    Family History  Problem Relation Age of Onset  . Anuerysm Mother   . Diabetes Father   . Hypertension Father   . Colon cancer Paternal Uncle    Past Surgical History:  Procedure Laterality Date  . ABDOMINAL HYSTERECTOMY    . BIOPSY  05/15/2018   Procedure: BIOPSY;  Surgeon: Daneil Dolin, MD;  Location: [facility];  Service: Endoscopy;;  polyp cb  .  CHOLECYSTECTOMY    . COLONOSCOPY N/A 05/15/2018   Procedure: COLONOSCOPY;  Surgeon: Daneil Dolin, MD;  Location: [facility];  Service: Endoscopy;  Laterality: N/A;  12:00pm  . EYE SURGERY    . KNEE ARTHROSCOPY     x2  . LAPAROSCOPIC ASSISTED VAGINAL HYSTERECTOMY  10/11/2011   Procedure: LAPAROSCOPIC ASSISTED VAGINAL HYSTERECTOMY;  Surgeon: Margarette Asal, MD;  Location: [facility];  Service: Gynecology;  Laterality: N/A;  . LASIK    . POLYPECTOMY  05/15/2018   Procedure: POLYPECTOMY;  Surgeon: Daneil Dolin, MD;  Location: [facility];  Service: Endoscopy;;  colon    Social History   Social History Narrative  . Not on file    There is no immunization history on file for this patient.   Objective: Vital Signs: BP 117/71 (BP Location: Left Arm, Patient Position: Sitting, Cuff Size: Normal)   Pulse (!) 55   Resp 13   Ht 5\' 2"  (1.575 m)   Wt 159 lb 6.4 oz (72.3 kg)   LMP 01/09/2011   BMI 29.15 kg/m    Physical Exam Vitals signs and nursing note reviewed.  Constitutional:      Appearance: She is well-developed.  HENT:     Head: Normocephalic and atraumatic.  Eyes:     Conjunctiva/sclera: Conjunctivae normal.  Neck:     Musculoskeletal: Normal range of motion.  Cardiovascular:     Rate and Rhythm: Normal rate and regular rhythm.     Heart sounds: Normal heart sounds.  Pulmonary:     Effort: Pulmonary effort is normal.     Breath sounds: Normal breath sounds.  Abdominal:     General: Bowel sounds are normal.     Palpations: Abdomen is soft.  Lymphadenopathy:     Cervical: No cervical adenopathy.  Skin:    General: Skin is warm and dry.     Capillary Refill: Capillary refill takes less than 2 seconds.  Neurological:     Mental Status: She is alert and oriented to person, place, and time.  Psychiatric:        Behavior: Behavior normal.      Musculoskeletal Exam: C-spine thoracic and lumbar spine with good range of motion.  Shoulder joints, elbow joints, wrist joints, MCPs PIPs and DIPs with good range of motion with no synovitis.  Good range of motion of bilateral hip joints knee joints ankles.  She had warmth on palpation of her right knee joint.  CDAI Exam: CDAI Score: - Patient Global: -; Provider Global: - Swollen: -; Tender: - Joint Exam   No joint exam has been documented for this visit   There is  currently no information documented on the homunculus. Go to the Rheumatology activity and complete the homunculus joint exam.  Investigation: No additional findings.  Imaging: Xr Knee 3 View Right  Result Date: 10/03/2018 Medial, intercondylar and lateral osteophytes were noted.  Moderate lateral compartment narrowing was noted.  Moderate to severe patellofemoral narrowing was noted.  No chondrocalcinosis was noted.  There was no radiographic progression from the x-rays of January 12, 2017. Impression: Moderate osteoarthritis and moderate to severe chondromalacia patella of the knee joint.   Recent Labs: Lab Results  Component Value Date   WBC 5.4 07/13/2016   HGB 13.2 07/13/2016   PLT 302 07/13/2016   NA 141 10/02/2018   K 4.7 10/02/2018   CL 106 10/02/2018   CO2 30 10/02/2018   GLUCOSE 92 10/02/2018   BUN 12 10/02/2018  CREATININE 0.62 10/02/2018   BILITOT 0.8 07/13/2016   ALKPHOS 50 07/13/2016   AST 15 07/13/2016   ALT 13 07/13/2016   PROT 6.9 07/13/2016   ALBUMIN 3.8 07/13/2016   CALCIUM 9.2 10/02/2018   GFRAA >89 07/13/2016    Speciality Comments: No specialty comments available.  Procedures:  Large Joint Inj: R knee on 10/03/2018 4:05 PM Indications: pain Details: 27 G 1.5 in needle, medial approach  Arthrogram: No  Medications: 40 mg triamcinolone acetonide 40 MG/ML; 1.5 mL lidocaine 1 % Aspirate: 0 mL Outcome: tolerated well, no immediate complications Procedure, treatment alternatives, risks and benefits explained, specific risks discussed. Consent was given by the patient. Immediately prior to procedure a time out was called to verify the correct patient, procedure, equipment, support staff and site/side marked as required. Patient was prepped and draped in the usual sterile fashion.     Allergies: Celebrex [celecoxib]   Assessment / Plan:     Visit Diagnoses:  1. Chronic pain of right knee -she has been having increased pain and discomfort in her  right knee joint.  There was warmth on palpation.  The x-ray obtained today showed moderate lateral compartment narrowing and osteophytes.  There was no radiographic progression compared to the x-rays taken 2 years ago.  After informed consent was obtained and different treatment options were discussed the right knee joint was prepped in sterile fashion injected with cortisone as described above.  I have advised her to contact me in case she has persistent symptoms.  A handout on knee joint muscle strengthening exercises was given.   2. Primary osteoarthritis of both knees -she has osteoarthritis involving her bilateral knee joints.  She has had Visco supplement injections in the past which is been helpful.   3. Primary osteoarthritis of both feet -she has been using proper fitting shoes which is been helpful.   4. DDD (degenerative disc disease), lumbar -she has some discomfort especially as she has been sitting for many hours working from home.   5. Chronic right SI joint pain -chronic discomfort is tolerable currently.   6. Nodular episcleritis of both eyes -she has had no recent episodes of episcleritis.   7. History of TIA (transient ischemic attack)    8. History of cholecystectomy      Orders: Orders Placed This Encounter  Procedures  . Large Joint Inj  . XR KNEE 3 VIEW RIGHT   No orders of the defined types were placed in this encounter.     Follow-Up Instructions: Return in about 6 months (around 04/05/2019) for Osteoarthritis.   Bo Merino, MD  Note - This record has been created using Editor, commissioning.  Chart creation errors have been sought, but may not always  have been located. Such creation errors do not reflect on  the standard of medical care.

## 2018-09-26 ENCOUNTER — Encounter: Payer: Self-pay | Admitting: Cardiovascular Disease

## 2018-09-26 ENCOUNTER — Telehealth (INDEPENDENT_AMBULATORY_CARE_PROVIDER_SITE_OTHER): Payer: 59 | Admitting: Cardiovascular Disease

## 2018-09-26 VITALS — Ht 62.0 in | Wt 162.0 lb

## 2018-09-26 DIAGNOSIS — M25471 Effusion, right ankle: Secondary | ICD-10-CM

## 2018-09-26 DIAGNOSIS — R001 Bradycardia, unspecified: Secondary | ICD-10-CM

## 2018-09-26 DIAGNOSIS — Z01812 Encounter for preprocedural laboratory examination: Secondary | ICD-10-CM

## 2018-09-26 DIAGNOSIS — R9431 Abnormal electrocardiogram [ECG] [EKG]: Secondary | ICD-10-CM

## 2018-09-26 DIAGNOSIS — G459 Transient cerebral ischemic attack, unspecified: Secondary | ICD-10-CM

## 2018-09-26 DIAGNOSIS — R0789 Other chest pain: Secondary | ICD-10-CM | POA: Diagnosis not present

## 2018-09-26 DIAGNOSIS — M25474 Effusion, right foot: Secondary | ICD-10-CM

## 2018-09-26 DIAGNOSIS — I209 Angina pectoris, unspecified: Secondary | ICD-10-CM

## 2018-09-26 DIAGNOSIS — R5383 Other fatigue: Secondary | ICD-10-CM

## 2018-09-26 NOTE — Progress Notes (Signed)
Virtual Visit via Video Note   This visit type was conducted due to national recommendations for restrictions regarding the COVID-19 Pandemic (e.g. social distancing) in an effort to limit this patient's exposure and mitigate transmission in our community.  Due to her co-morbid illnesses, this patient is at least at moderate risk for complications without adequate follow up.  This format is felt to be most appropriate for this patient at this time.  All issues noted in this document were discussed and addressed.  A limited physical exam was performed with this format.  Please refer to the patient's chart for her consent to telehealth for [facility].   Date:  09/26/2018   ID:  TIWANNA [name], DOB [birth year], MRN [id number]  Patient Location: Home Provider Location: Office  PCP:  Doree Albee, MD  Cardiologist:  Kate Sable, MD  Electrophysiologist:  None   Evaluation Performed:  Follow-Up Visit  Chief Complaint:  chest pain and bradycardia  History of Present Illness:    [name] is a 53 y.o. female with chest pain and bradycardia.  In summary, event monitoring demonstrated sinus rhythm, sinus bradycardia, and sinus tachycardia. Symptoms correlated with sinus bradycardia. There were no significant arrhythmias.  Exercise tolerance test was normal with no evidence of chronotropic incompetence. It was negative for ischemia.  I reviewed the echocardiogram performed at [facility] earlier this year which demonstrated normal left ventricular systolic and diastolic function, LVEF 60 to 65%.  She and her husband are on vacation at the moment.  She has been having bilateral ankle swelling.  She denies orthopnea and paroxysmal nocturnal dyspnea.  She has had palpitations as well.  She has occasional chest tightness but it is inconsistent but can occur with exertion.  She has been more fatigued over the last several months.  Work stress has been fairly high.  The  patient does not have symptoms concerning for COVID-19 infection (fever, chills, cough, or new shortness of breath).   Social history: She works as a Hospital doctor for [employer].   Past Medical History:  Diagnosis Date  . DDD (degenerative disc disease), lumbar 01/19/2016  . History of TIA (transient ischemic attack) 01/19/2016  . Hypothyroidism   . Osteoarthritis of both feet 01/19/2016  . Osteoarthritis of both knees 01/19/2016  . PONV (postoperative nausea and vomiting)   . Stroke [facility]) 07/2010   "TIA" per patient   Past Surgical History:  Procedure Laterality Date  . ABDOMINAL HYSTERECTOMY    . BIOPSY  05/15/2018   Procedure: BIOPSY;  Surgeon: Daneil Dolin, MD;  [facility];  Service: Endoscopy;;  polyp cb  . CHOLECYSTECTOMY    . COLONOSCOPY N/A 05/15/2018   Procedure: COLONOSCOPY;  Surgeon: Daneil Dolin, MD;  Location: [facility];  Service: Endoscopy;  Laterality: N/A;  12:00pm  . EYE SURGERY    . KNEE ARTHROSCOPY     x2  . LAPAROSCOPIC ASSISTED VAGINAL HYSTERECTOMY  10/11/2011   Procedure: LAPAROSCOPIC ASSISTED VAGINAL HYSTERECTOMY;  Surgeon: Margarette Asal, MD;  Location: [facility];  Service: Gynecology;  Laterality: N/A;  . LASIK    . POLYPECTOMY  05/15/2018   Procedure: POLYPECTOMY;  Surgeon: Daneil Dolin, MD;  Location: [facility];  Service: Endoscopy;;  colon      Current Meds  Medication Sig  . aspirin 325 MG EC tablet Take 325 mg by mouth daily.  . baclofen (LIORESAL) 10 MG tablet TAKE 1 TABLET BY MOUTH  EVERY DAY AS NEEDED FOR MUSCLE SPASMS  . fluticasone (FLONASE) 50 MCG/ACT nasal spray Place 2 sprays into both nostrils daily as needed for allergies.   . Misc Natural Products (TART CHERRY ADVANCED PO) Take 1 capsule by mouth daily.   . NP THYROID 120 MG tablet Take 120 mg by mouth every morning.   . pravastatin (PRAVACHOL) 10 MG tablet Take 10 mg by mouth every evening.   . TURMERIC PO Take 1 capsule by mouth daily.   Marland Kitchen VITAMIN D PO Take 5,000 mg by mouth daily.  . [DISCONTINUED] Cholecalciferol (DIALYVITE VITAMIN D 5000 PO) Take by mouth daily.     Allergies:   Celebrex [celecoxib]   Social History   Tobacco Use  . Smoking status: Never Smoker  . Smokeless tobacco: Never Used  Substance Use Topics  . Alcohol use: Yes    Comment: occasionally  . Drug use: No     Family Hx: The patient's family history includes Anuerysm in her mother; Colon cancer in her paternal uncle; Diabetes in her father; Hypertension in her father.  ROS:   Please see the history of present illness.     All other systems reviewed and are negative.   Prior CV studies:   The following studies were reviewed today:  See above  Labs/Other Tests and Data Reviewed:    EKG:  No ECG reviewed.  Recent Labs: No results found for requested labs within last 8760 hours.   Recent Lipid Panel No results found for: CHOL, TRIG, HDL, CHOLHDL, LDLCALC, LDLDIRECT  Wt Readings from Last 3 Encounters:  09/26/18 162 lb (73.5 kg)  05/23/18 160 lb 9.6 oz (72.8 kg)  05/15/18 160 lb (72.6 kg)     Objective:    Vital Signs:  Ht 5\' 2"  (1.575 m)   Wt 162 lb (73.5 kg)   LMP 01/09/2011   BMI 29.63 kg/m    VITAL SIGNS:  reviewed   General: No acute distress. HEENT: Extraocular movements intact.   Neck: No JVD Respiratory: Normal respiratory movements. Neuro: Alert and oriented x3.  No focal deficits. Psych: Normal affect.  ASSESSMENT & PLAN:    1. Bradycardia with dizziness and fatigue:She does not have a blood pressure cuff or heart rate monitor at present.   Event monitoring was unremarkable. She was bradycardic during sleep which is normal.Thyroid function is normal.  She is not on any AV nodal blocking agents to explain bradycardia.There was no evidence of chronotropic incompetence with exercise tolerance testing. Sleep study in March 2020 was benign with some intermittent snoring but no obstructive sleep apnea.  Given her exertional chest tightness, I will arrange for coronary CT angiography.  2.History ofTIA: Currently on aspirin 325 mg and pravastatin 10 mg.  3. Abnormal ECG with chest tightness: She continues to experience intermittent exertional chest tightness. She also has some fatigue..Echocardiogram and exercise tolerance test were unremarkable.  I will arrange for coronary CT angiography.  4.  Bilateral ankle swelling: Given her normal left ventricular systolic and diastolic function by echocardiogram performed in 2019, I suspect she has some degree of venous insufficiency.  I recommended leg elevation and once the weather cools down, she could consider compression stockings.   COVID-19 Education: The signs and symptoms of COVID-19 were discussed with the patient and how to seek care for testing (follow up with PCP or arrange E-visit).  The importance of social distancing was discussed today.  Time:   Today, I have spent 15 minutes with the patient with  telehealth technology discussing the above problems.     Medication Adjustments/Labs and Tests Ordered: Current medicines are reviewed at length with the patient today.  Concerns regarding medicines are outlined above.   Tests Ordered: No orders of the defined types were placed in this encounter.   Medication Changes: No orders of the defined types were placed in this encounter.   Follow Up:  Virtual Visit or In Person in 3 month(s)  Signed, Kate Sable, MD  09/26/2018 3:26 PM    [place]

## 2018-09-26 NOTE — Addendum Note (Signed)
Addended by: Laurine Blazer on: 09/26/2018 04:08 PM   Modules accepted: Orders

## 2018-09-26 NOTE — Patient Instructions (Addendum)
Medication Instructions:  Continue all current medications.  Labwork:  BMET - order enclosed.  Please do this lab at [place] located across from [facility].   Needs to be done 2-3 days prior to CT.  Testing/Procedures:  Coronary CT - done at [facility] - this test may take several weeks to get scheduled, but someone will be calling you when done.    Office will contact with results via phone or letter.    Follow-Up: 3 months   Any Other Special Instructions Will Be Listed Below (If Applicable).  If you need a refill on your cardiac medications before your next appointment, please call your pharmacy.  ================================================================================  Please arrive at the [facility] main entrance of [facility] at xx:xx AM (30-45 minutes prior to test start time)  [facility] [place], [place] [address] [phone]  Proceed to the [facility] Radiology Department (First Floor).  Please follow these instructions carefully (unless otherwise directed):  Hold all erectile dysfunction medications at least 48 hours prior to test.  On the Night Before the Test: . Be sure to Drink plenty of water. . Do not consume any caffeinated/decaffeinated beverages or chocolate 12 hours prior to your test. . Do not take any antihistamines 12 hours prior to your test. . If you take Metformin do not take 24 hours prior to test. . If the patient has contrast allergy: ? Patient will need a prescription for Prednisone and very clear instructions (as follows): 1. Prednisone 50 mg - take 13 hours prior to test 2. Take another Prednisone 50 mg 7 hours prior to test 3. Take another Prednisone 50 mg 1 hour prior to test 4. Take Benadryl 50 mg 1 hour prior to test . Patient must complete all four doses of above prophylactic medications. . Patient will need a ride after test due to Benadryl.  On the Day of the  Test: . Drink plenty of water. Do not drink any water within one hour of the test. . Do not eat any food 4 hours prior to the test. . You may take your regular medications prior to the test.  . Take metoprolol (Lopressor) two hours prior to test. (YOU WILL NOT NEED THIS)  . HOLD Furosemide/Hydrochlorothiazide morning of the test.     After the Test: . Drink plenty of water. . After receiving IV contrast, you may experience a mild flushed feeling. This is normal. . On occasion, you may experience a mild rash up to 24 hours after the test. This is not dangerous. If this occurs, you can take Benadryl 25 mg and increase your fluid intake. . If you experience trouble breathing, this can be serious. If it is severe call 911 IMMEDIATELY. If it is mild, please call our office. . If you take any of these medications: Glipizide/Metformin, Avandament, Glucavance, please do not take 48 hours after completing test.

## 2018-10-03 ENCOUNTER — Ambulatory Visit: Payer: 59 | Admitting: Rheumatology

## 2018-10-03 ENCOUNTER — Ambulatory Visit: Payer: Self-pay

## 2018-10-03 ENCOUNTER — Other Ambulatory Visit: Payer: Self-pay

## 2018-10-03 ENCOUNTER — Encounter: Payer: Self-pay | Admitting: Rheumatology

## 2018-10-03 VITALS — BP 117/71 | HR 55 | Resp 13 | Ht 62.0 in | Wt 159.4 lb

## 2018-10-03 DIAGNOSIS — M19071 Primary osteoarthritis, right ankle and foot: Secondary | ICD-10-CM

## 2018-10-03 DIAGNOSIS — M533 Sacrococcygeal disorders, not elsewhere classified: Secondary | ICD-10-CM

## 2018-10-03 DIAGNOSIS — M25561 Pain in right knee: Secondary | ICD-10-CM

## 2018-10-03 DIAGNOSIS — G8929 Other chronic pain: Secondary | ICD-10-CM

## 2018-10-03 DIAGNOSIS — M170 Bilateral primary osteoarthritis of knee: Secondary | ICD-10-CM

## 2018-10-03 DIAGNOSIS — M5136 Other intervertebral disc degeneration, lumbar region: Secondary | ICD-10-CM | POA: Diagnosis not present

## 2018-10-03 DIAGNOSIS — Z9049 Acquired absence of other specified parts of digestive tract: Secondary | ICD-10-CM

## 2018-10-03 DIAGNOSIS — M19072 Primary osteoarthritis, left ankle and foot: Secondary | ICD-10-CM

## 2018-10-03 DIAGNOSIS — Z8673 Personal history of transient ischemic attack (TIA), and cerebral infarction without residual deficits: Secondary | ICD-10-CM

## 2018-10-03 DIAGNOSIS — H15123 Nodular episcleritis, bilateral: Secondary | ICD-10-CM

## 2018-10-03 LAB — BASIC METABOLIC PANEL
BUN: 12 mg/dL (ref 7–25)
CO2: 30 mmol/L (ref 20–32)
Calcium: 9.2 mg/dL (ref 8.6–10.4)
Chloride: 106 mmol/L (ref 98–110)
Creat: 0.62 mg/dL (ref 0.50–1.05)
Glucose, Bld: 92 mg/dL (ref 65–99)
Potassium: 4.7 mmol/L (ref 3.5–5.3)
Sodium: 141 mmol/L (ref 135–146)

## 2018-10-03 MED ORDER — LIDOCAINE HCL 1 % IJ SOLN
1.5000 mL | INTRAMUSCULAR | Status: AC | PRN
  Administered 2018-10-03: 1.5 mL

## 2018-10-03 MED ORDER — TRIAMCINOLONE ACETONIDE 40 MG/ML IJ SUSP
40.0000 mg | INTRAMUSCULAR | Status: AC | PRN
  Administered 2018-10-03: 40 mg via INTRA_ARTICULAR

## 2018-10-03 NOTE — Patient Instructions (Signed)
Journal for Nurse Practitioners, 15(4), 263-267. Retrieved December 24, 2017 from http://clinicalkey.com/nursing">  Knee Exercises Ask your health care provider which exercises are safe for you. Do exercises exactly as told by your health care provider and adjust them as directed. It is normal to feel mild stretching, pulling, tightness, or discomfort as you do these exercises. Stop right away if you feel sudden pain or your pain gets worse. Do not begin these exercises until told by your health care provider. Stretching and range-of-motion exercises These exercises warm up your muscles and joints and improve the movement and flexibility of your knee. These exercises also help to relieve pain and swelling. Knee extension, prone 1. Lie on your abdomen (prone position) on a bed. 2. Place your left / right knee just beyond the edge of the surface so your knee is not on the bed. You can put a towel under your left / right thigh just above your kneecap for comfort. 3. Relax your leg muscles and allow gravity to straighten your knee (extension). You should feel a stretch behind your left / right knee. 4. Hold this position for __________ seconds. 5. Scoot up so your knee is supported between repetitions. Repeat __________ times. Complete this exercise __________ times a day. Knee flexion, active  1. Lie on your back with both legs straight. If this causes back discomfort, bend your left / right knee so your foot is flat on the floor. 2. Slowly slide your left / right heel back toward your buttocks. Stop when you feel a gentle stretch in the front of your knee or thigh (flexion). 3. Hold this position for __________ seconds. 4. Slowly slide your left / right heel back to the starting position. Repeat __________ times. Complete this exercise __________ times a day. Quadriceps stretch, prone  1. Lie on your abdomen on a firm surface, such as a bed or padded floor. 2. Bend your left / right knee and hold  your ankle. If you cannot reach your ankle or pant leg, loop a belt around your foot and grab the belt instead. 3. Gently pull your heel toward your buttocks. Your knee should not slide out to the side. You should feel a stretch in the front of your thigh and knee (quadriceps). 4. Hold this position for __________ seconds. Repeat __________ times. Complete this exercise __________ times a day. Hamstring, supine 1. Lie on your back (supine position). 2. Loop a belt or towel over the ball of your left / right foot. The ball of your foot is on the walking surface, right under your toes. 3. Straighten your left / right knee and slowly pull on the belt to raise your leg until you feel a gentle stretch behind your knee (hamstring). ? Do not let your knee bend while you do this. ? Keep your other leg flat on the floor. 4. Hold this position for __________ seconds. Repeat __________ times. Complete this exercise __________ times a day. Strengthening exercises These exercises build strength and endurance in your knee. Endurance is the ability to use your muscles for a long time, even after they get tired. Quadriceps, isometric This exercise stretches the muscles in front of your thigh (quadriceps) without moving your knee joint (isometric). 1. Lie on your back with your left / right leg extended and your other knee bent. Put a rolled towel or small pillow under your knee if told by your health care provider. 2. Slowly tense the muscles in the front of your left /   right thigh. You should see your kneecap slide up toward your hip or see increased dimpling just above the knee. This motion will push the back of the knee toward the floor. 3. For __________ seconds, hold the muscle as tight as you can without increasing your pain. 4. Relax the muscles slowly and completely. Repeat __________ times. Complete this exercise __________ times a day. Straight leg raises This exercise stretches the muscles in front  of your thigh (quadriceps) and the muscles that move your hips (hip flexors). 1. Lie on your back with your left / right leg extended and your other knee bent. 2. Tense the muscles in the front of your left / right thigh. You should see your kneecap slide up or see increased dimpling just above the knee. Your thigh may even shake a bit. 3. Keep these muscles tight as you raise your leg 4-6 inches (10-15 cm) off the floor. Do not let your knee bend. 4. Hold this position for __________ seconds. 5. Keep these muscles tense as you lower your leg. 6. Relax your muscles slowly and completely after each repetition. Repeat __________ times. Complete this exercise __________ times a day. Hamstring, isometric 1. Lie on your back on a firm surface. 2. Bend your left / right knee about __________ degrees. 3. Dig your left / right heel into the surface as if you are trying to pull it toward your buttocks. Tighten the muscles in the back of your thighs (hamstring) to "dig" as hard as you can without increasing any pain. 4. Hold this position for __________ seconds. 5. Release the tension gradually and allow your muscles to relax completely for __________ seconds after each repetition. Repeat __________ times. Complete this exercise __________ times a day. Hamstring curls If told by your health care provider, do this exercise while wearing ankle weights. Begin with __________ lb weights. Then increase the weight by 1 lb (0.5 kg) increments. Do not wear ankle weights that are more than __________ lb. 1. Lie on your abdomen with your legs straight. 2. Bend your left / right knee as far as you can without feeling pain. Keep your hips flat against the floor. 3. Hold this position for __________ seconds. 4. Slowly lower your leg to the starting position. Repeat __________ times. Complete this exercise __________ times a day. Squats This exercise strengthens the muscles in front of your thigh and knee  (quadriceps). 1. Stand in front of a table, with your feet and knees pointing straight ahead. You may rest your hands on the table for balance but not for support. 2. Slowly bend your knees and lower your hips like you are going to sit in a chair. ? Keep your weight over your heels, not over your toes. ? Keep your lower legs upright so they are parallel with the table legs. ? Do not let your hips go lower than your knees. ? Do not bend lower than told by your health care provider. ? If your knee pain increases, do not bend as low. 3. Hold the squat position for __________ seconds. 4. Slowly push with your legs to return to standing. Do not use your hands to pull yourself to standing. Repeat __________ times. Complete this exercise __________ times a day. Wall slides This exercise strengthens the muscles in front of your thigh and knee (quadriceps). 1. Lean your back against a smooth wall or door, and walk your feet out 18-24 inches (46-61 cm) from it. 2. Place your feet hip-width apart. 3.   Slowly slide down the wall or door until your knees bend __________ degrees. Keep your knees over your heels, not over your toes. Keep your knees in line with your hips. 4. Hold this position for __________ seconds. Repeat __________ times. Complete this exercise __________ times a day. Straight leg raises This exercise strengthens the muscles that rotate the leg at the hip and move it away from your body (hip abductors). 1. Lie on your side with your left / right leg in the top position. Lie so your head, shoulder, knee, and hip line up. You may bend your bottom knee to help you keep your balance. 2. Roll your hips slightly forward so your hips are stacked directly over each other and your left / right knee is facing forward. 3. Leading with your heel, lift your top leg 4-6 inches (10-15 cm). You should feel the muscles in your outer hip lifting. ? Do not let your foot drift forward. ? Do not let your knee  roll toward the ceiling. 4. Hold this position for __________ seconds. 5. Slowly return your leg to the starting position. 6. Let your muscles relax completely after each repetition. Repeat __________ times. Complete this exercise __________ times a day. Straight leg raises This exercise stretches the muscles that move your hips away from the front of the pelvis (hip extensors). 1. Lie on your abdomen on a firm surface. You can put a pillow under your hips if that is more comfortable. 2. Tense the muscles in your buttocks and lift your left / right leg about 4-6 inches (10-15 cm). Keep your knee straight as you lift your leg. 3. Hold this position for __________ seconds. 4. Slowly lower your leg to the starting position. 5. Let your leg relax completely after each repetition. Repeat __________ times. Complete this exercise __________ times a day. This information is not intended to replace advice given to you by your health care provider. Make sure you discuss any questions you have with your health care provider. Document Released: 01/18/2005 Document Revised: 12/25/2017 Document Reviewed: 12/25/2017 Elsevier Patient Education  2020 [employer]  

## 2018-10-04 ENCOUNTER — Telehealth: Payer: Self-pay | Admitting: *Deleted

## 2018-10-04 NOTE — Telephone Encounter (Signed)
Called patient with test results. No answer. Left message to call back.  

## 2018-10-04 NOTE — Telephone Encounter (Signed)
-----   Message from Herminio Commons, MD sent at 10/03/2018  8:16 AM EDT ----- Normal

## 2018-10-12 ENCOUNTER — Other Ambulatory Visit: Payer: Self-pay | Admitting: Rheumatology

## 2018-10-14 NOTE — Telephone Encounter (Signed)
Last Visit: 10/03/18 Next Visit: 04/07/18  Okay to refill per Dr. Estanislado Pandy

## 2018-10-15 ENCOUNTER — Telehealth (HOSPITAL_COMMUNITY): Payer: Self-pay | Admitting: Emergency Medicine

## 2018-10-15 NOTE — Telephone Encounter (Signed)
Left message on voicemail with name and callback number Zadiel Leyh RN Navigator Cardiac Imaging  Heart and Vascular Services [phone] Office [phone] Cell  

## 2018-10-16 ENCOUNTER — Other Ambulatory Visit: Payer: Self-pay

## 2018-10-16 ENCOUNTER — Ambulatory Visit
Admission: RE | Admit: 2018-10-16 | Discharge: 2018-10-16 | Disposition: A | Discharge status: Home / Self Care | Payer: 59 | Source: Ambulatory Visit | Attending: Cardiovascular Disease | Admitting: Cardiovascular Disease

## 2018-10-16 DIAGNOSIS — I209 Angina pectoris, unspecified: Secondary | ICD-10-CM

## 2018-10-16 MED ORDER — IOHEXOL 350 MG/ML SOLN
100.0000 mL | Freq: Once | INTRAVENOUS | Status: AC | PRN
  Administered 2018-10-16: 100 mL via INTRAVENOUS

## 2018-10-16 MED ORDER — NITROGLYCERIN 0.4 MG SL SUBL
0.8000 mg | SUBLINGUAL_TABLET | Freq: Once | SUBLINGUAL | Status: AC
  Administered 2018-10-16: 0.8 mg via SUBLINGUAL

## 2018-10-16 NOTE — Progress Notes (Signed)
Patient ID: [name], female   DOB: Jan 28, 1966, 53 y.o.   MRN: [id number]  Pt states some dizziness when going from lying to sitting on CT table; BP 112/61, HR 65; pt given po fluids

## 2018-11-26 ENCOUNTER — Other Ambulatory Visit: Payer: Self-pay | Admitting: Rheumatology

## 2018-11-26 NOTE — Telephone Encounter (Signed)
Last Visit: 10/03/18 Next Visit: 04/08/19  Okay to refill per Dr. Estanislado Pandy

## 2018-11-28 ENCOUNTER — Other Ambulatory Visit: Payer: Self-pay

## 2018-11-28 ENCOUNTER — Encounter (INDEPENDENT_AMBULATORY_CARE_PROVIDER_SITE_OTHER): Payer: Self-pay | Admitting: Internal Medicine

## 2018-11-28 ENCOUNTER — Ambulatory Visit (INDEPENDENT_AMBULATORY_CARE_PROVIDER_SITE_OTHER): Payer: 59 | Admitting: Internal Medicine

## 2018-11-28 VITALS — BP 130/80 | HR 64 | Ht 62.0 in | Wt 160.0 lb

## 2018-11-28 DIAGNOSIS — M19071 Primary osteoarthritis, right ankle and foot: Secondary | ICD-10-CM | POA: Diagnosis not present

## 2018-11-28 DIAGNOSIS — M170 Bilateral primary osteoarthritis of knee: Secondary | ICD-10-CM

## 2018-11-28 DIAGNOSIS — Z131 Encounter for screening for diabetes mellitus: Secondary | ICD-10-CM

## 2018-11-28 DIAGNOSIS — N951 Menopausal and female climacteric states: Secondary | ICD-10-CM

## 2018-11-28 DIAGNOSIS — Z13220 Encounter for screening for lipoid disorders: Secondary | ICD-10-CM

## 2018-11-28 DIAGNOSIS — R6882 Decreased libido: Secondary | ICD-10-CM

## 2018-11-28 DIAGNOSIS — E559 Vitamin D deficiency, unspecified: Secondary | ICD-10-CM

## 2018-11-28 DIAGNOSIS — Z8673 Personal history of transient ischemic attack (TIA), and cerebral infarction without residual deficits: Secondary | ICD-10-CM | POA: Diagnosis not present

## 2018-11-28 DIAGNOSIS — Z114 Encounter for screening for human immunodeficiency virus [HIV]: Secondary | ICD-10-CM

## 2018-11-28 DIAGNOSIS — R5383 Other fatigue: Secondary | ICD-10-CM

## 2018-11-28 DIAGNOSIS — M19072 Primary osteoarthritis, left ankle and foot: Secondary | ICD-10-CM

## 2018-11-28 DIAGNOSIS — Z0001 Encounter for general adult medical examination with abnormal findings: Secondary | ICD-10-CM

## 2018-11-28 DIAGNOSIS — R5381 Other malaise: Secondary | ICD-10-CM

## 2018-11-28 HISTORY — DX: Vitamin D deficiency, unspecified: E55.9

## 2018-11-28 NOTE — Progress Notes (Signed)
Chief Complaint: This 53 year old lady comes in for an annual physical exam and to address her chronic conditions which are described below. HPI: She has a history of TIA in the past and is being followed by neurology, Dr. Merlene Laughter.  He did not have any new recommendations. She also has significant osteoarthritis for which she sees rheumatology.  She tends to take over-the-counter supplements that seem to help her. She also has hypothyroidism for which she takes desiccated NP thyroid.  Her symptoms have improved since she has been on this medication. She continues to still feel fatigued and is somewhat frustrated that she has not lost weight. She has vitamin D deficiency also but her levels have not been checked for some time.  Past Medical History:  Diagnosis Date  . DDD (degenerative disc disease), lumbar 01/19/2016  . History of TIA (transient ischemic attack) 01/19/2016  . Hypothyroidism   . Osteoarthritis of both feet 01/19/2016  . Osteoarthritis of both knees 01/19/2016  . PONV (postoperative nausea and vomiting)   . Stroke [facility]) 07/2010   "TIA" per patient  . Vitamin D deficiency disease 11/28/2018     Social History   Social History Narrative   Lives with husband and son/family.    Social History   Substance and Sexual Activity  Alcohol Use Yes   Comment: occasionally   Married Data Unavailable  Allergies:  Allergies  Allergen Reactions  . Celebrex [Celecoxib] Swelling    Facial swelling     Current Meds  Medication Sig  . aspirin 325 MG EC tablet Take 325 mg by mouth daily.  . baclofen (LIORESAL) 10 MG tablet TAKE 1 TABLET BY MOUTH EVERY DAY AS NEEDED FOR MUSCLE SPASMS (Patient taking differently: Take 10 mg by mouth daily at 12 noon. TAKE 1 TABLET BY MOUTH EVERY DAY AS NEEDED FOR MUSCLE SPASMS)  . Cholecalciferol (VITAMIN D-3) 125 MCG (5000 UT) TABS Take 10,000 Units by mouth daily at 12 noon.  . Misc Natural Products (TART CHERRY ADVANCED PO) Take 1 capsule  by mouth daily.   . NP THYROID 120 MG tablet Take 120 mg by mouth every morning.   . pravastatin (PRAVACHOL) 10 MG tablet Take 10 mg by mouth every evening.   . TURMERIC PO Take 1 capsule by mouth daily.    [id number] from the symptoms mentioned above,there are no other symptoms referable to all systems reviewed.  Physical Exam: Blood pressure 130/80, pulse 64, height 5\' 2"  (1.575 m), weight 160 lb (72.6 kg), last menstrual period 01/09/2011. Vitals with BMI 11/28/2018 10/16/2018 10/16/2018  Height 5\' 2"  - -  Weight 160 lbs - -  BMI [id number] - -  Systolic [id number]  Diastolic 80 61 47  Pulse 64 68 54      She looks systemically well. General: Alert, cooperative, and appears to be the stated age.No pallor.  No jaundice.  No clubbing. Head: Normocephalic Eyes: Sclera white, pupils equal and reactive to light, red reflex x 2,  Ears: Normal bilaterally Oral cavity: Lips, mucosa, and tongue normal: Teeth and gums normal Neck: No adenopathy, supple, symmetrical, trachea midline, and thyroid does not appear enlarged Breast: Not examined. Respiratory: Clear to auscultation bilaterally.No wheezing, crackles or bronchial breathing. Cardiovascular: Heart sounds are present and appear to be normal without murmurs or added sounds.  No carotid bruits.  Peripheral pulses are present and equal bilaterally.: Soft, nontender, Gastrointestinal:positive bowel sounds, no hepatosplenomegaly.  No masses felt.No tenderness. Skin: Clear, No rashes noted.No worrisome skin lesions  seen. Neurological: Grossly intact without focal findings, cranial nerves II through XII intact, muscle strength equal bilaterally Musculoskeletal: No acute joint abnormalities noted.Full range of movement noted with joints. Psychiatric: Affect appropriate, non-anxious.    Assessment  1. Primary osteoarthritis of both knees   2. Primary osteoarthritis of both feet   3. History of TIA (transient ischemic attack)   4. Vitamin D  deficiency disease   5. Malaise and fatigue   6. Screening for lipoid disorders   7. Screening for diabetes mellitus   8. Screening for HIV (human immunodeficiency virus)   9. Perimenopause   10. Decreased libido       Plan  1. She will continue with all medications for her chronic conditions above. 2. Blood work was taken as documented below. 3. We discussed nutrition again and she does intermittent fasting usually 3-4 times a week and I recommended that she progressed this to every day 16 hours and I think she will start to see some results. 4. Further recommendations will depend on blood results and I will see her for follow-up in about 3 months time. 5. Today, in addition to a preventative visit, I performed an office visit to address her chronic conditions and symptoms above.      Tests Ordered:   Orders Placed This Encounter  Procedures  . CBC  . COMPLETE METABOLIC PANEL WITH GFR  . Follicle stimulating hormone  . Luteinizing hormone  . Progesterone  . Estradiol  . Testos,Total,Free and SHBG (Female)  . T3, free  . TSH  . VITAMIN D 25 Hydroxy (Vit-D Deficiency, Fractures)  . Lipid panel  . HIV Antibody (routine testing w rflx)  . Hemoglobin A1c      C    11/28/2018, 10:41 AM

## 2018-11-28 NOTE — Patient Instructions (Signed)

## 2018-12-03 LAB — LIPID PANEL
Cholesterol: 133 mg/dL (ref <200)
HDL: 66 mg/dL (ref >=50)
LDL Cholesterol (Calc): 55 mg/dL (calc)
Non-HDL Cholesterol (Calc): 67 mg/dL (calc) (ref <130)
Total CHOL/HDL Ratio: 2 (calc) (ref <5.0)
Triglycerides: 50 mg/dL (ref <150)

## 2018-12-03 LAB — TSH: TSH: 0.01 mIU/L — ABNORMAL LOW

## 2018-12-03 LAB — COMPLETE METABOLIC PANEL WITH GFR
AG Ratio: 1.3 (calc) (ref 1.0–2.5)
ALT: 20 U/L (ref 6–29)
AST: 18 U/L (ref 10–35)
Albumin: 3.9 g/dL (ref 3.6–5.1)
Alkaline phosphatase (APISO): 55 U/L (ref 37–153)
BUN: 9 mg/dL (ref 7–25)
CO2: 29 mmol/L (ref 20–32)
Calcium: 9.4 mg/dL (ref 8.6–10.4)
Chloride: 106 mmol/L (ref 98–110)
Creat: 0.62 mg/dL (ref 0.50–1.05)
GFR, Est African American: 122 mL/min/{1.73_m2} (ref >=60)
GFR, Est Non African American: 105 mL/min/{1.73_m2} (ref >=60)
Globulin: 2.9 g/dL (calc) (ref 1.9–3.7)
Glucose, Bld: 90 mg/dL (ref 65–99)
Potassium: 4.5 mmol/L (ref 3.5–5.3)
Sodium: 142 mmol/L (ref 135–146)
Total Bilirubin: 1 mg/dL (ref 0.2–1.2)
Total Protein: 6.8 g/dL (ref 6.1–8.1)

## 2018-12-03 LAB — CBC
HCT: 40.6 % (ref 35.0–45.0)
Hemoglobin: 13.2 g/dL (ref 11.7–15.5)
MCH: 29.7 pg (ref 27.0–33.0)
MCHC: 32.5 g/dL (ref 32.0–36.0)
MCV: 91.2 fL (ref 80.0–100.0)
MPV: 11 fL (ref 7.5–12.5)
Platelets: 280 10*3/uL (ref 140–400)
RBC: 4.45 10*6/uL (ref 3.80–5.10)
RDW: 11.8 % (ref 11.0–15.0)
WBC: 4.7 10*3/uL (ref 3.8–10.8)

## 2018-12-03 LAB — PROGESTERONE: Progesterone: <0.5 ng/mL

## 2018-12-03 LAB — HEMOGLOBIN A1C
Hgb A1c MFr Bld: 5.2 % of total Hgb (ref <5.7)
Mean Plasma Glucose: 103 (calc)
eAG (mmol/L): 5.7 (calc)

## 2018-12-03 LAB — ESTRADIOL: Estradiol: 33 pg/mL

## 2018-12-03 LAB — HIV ANTIBODY (ROUTINE TESTING W REFLEX): HIV 1&2 Ab, 4th Generation: NONREACTIVE

## 2018-12-03 LAB — T3, FREE: T3, Free: 5.9 pg/mL — ABNORMAL HIGH (ref 2.3–4.2)

## 2018-12-03 LAB — LUTEINIZING HORMONE: LH: 54.1 m[IU]/mL

## 2018-12-03 LAB — TESTOS,TOTAL,FREE AND SHBG (FEMALE)
Free Testosterone: 1.1 pg/mL (ref 0.1–6.4)
Sex Hormone Binding: 109 nmol/L (ref 17–124)
Testosterone, Total, LC-MS-MS: 20 ng/dL (ref 2–45)

## 2018-12-03 LAB — FOLLICLE STIMULATING HORMONE: FSH: 98.2 m[IU]/mL

## 2018-12-03 LAB — VITAMIN D 25 HYDROXY (VIT D DEFICIENCY, FRACTURES): Vit D, 25-Hydroxy: 79 ng/mL (ref 30–100)

## 2018-12-03 NOTE — Progress Notes (Signed)
Please see all your results here, I believe you are clearly in the menopause now!  Your thyroid is in a good range.  Your vitamin D also is in a good range.  Your HIV test is negative.  I think you will definitely benefit from several bioidentical hormones which we can discuss next time.  I want you to go to the following website to learn more about the hormones: [email or web address] See you next time, be well!

## 2018-12-23 ENCOUNTER — Other Ambulatory Visit (INDEPENDENT_AMBULATORY_CARE_PROVIDER_SITE_OTHER): Payer: Self-pay | Admitting: Internal Medicine

## 2018-12-23 ENCOUNTER — Telehealth (INDEPENDENT_AMBULATORY_CARE_PROVIDER_SITE_OTHER): Payer: Self-pay

## 2018-12-23 MED ORDER — THYROID 60 MG PO TABS: 120.0000 mg | ORAL_TABLET | Freq: Every day | ORAL | 3 refills | Status: DC

## 2018-12-23 NOTE — Telephone Encounter (Signed)
Gave pt a 30 day supply of samples of NP thyroid 60 mg

## 2018-12-28 ENCOUNTER — Other Ambulatory Visit (INDEPENDENT_AMBULATORY_CARE_PROVIDER_SITE_OTHER): Payer: Self-pay | Admitting: Internal Medicine

## 2018-12-28 ENCOUNTER — Other Ambulatory Visit: Payer: Self-pay | Admitting: Rheumatology

## 2018-12-30 NOTE — Telephone Encounter (Signed)
Last Visit: 10/03/18 Next Visit: 04/08/19  Okay to refill per Dr. Estanislado Pandy

## 2018-12-31 ENCOUNTER — Other Ambulatory Visit: Payer: Self-pay

## 2018-12-31 ENCOUNTER — Ambulatory Visit: Payer: 59 | Admitting: Cardiovascular Disease

## 2018-12-31 ENCOUNTER — Encounter: Payer: Self-pay | Admitting: Cardiovascular Disease

## 2018-12-31 VITALS — BP 111/70 | HR 50 | Ht 62.0 in | Wt 158.8 lb

## 2018-12-31 DIAGNOSIS — M25471 Effusion, right ankle: Secondary | ICD-10-CM

## 2018-12-31 DIAGNOSIS — G459 Transient cerebral ischemic attack, unspecified: Secondary | ICD-10-CM | POA: Diagnosis not present

## 2018-12-31 DIAGNOSIS — R5383 Other fatigue: Secondary | ICD-10-CM | POA: Diagnosis not present

## 2018-12-31 DIAGNOSIS — R001 Bradycardia, unspecified: Secondary | ICD-10-CM | POA: Diagnosis not present

## 2018-12-31 DIAGNOSIS — M25475 Effusion, left foot: Secondary | ICD-10-CM

## 2018-12-31 DIAGNOSIS — M25472 Effusion, left ankle: Secondary | ICD-10-CM

## 2018-12-31 DIAGNOSIS — M25474 Effusion, right foot: Secondary | ICD-10-CM

## 2018-12-31 NOTE — Progress Notes (Signed)
SUBJECTIVE: The patient presents for follow-up of chest pain and bradycardia.  In summary, eventmonitoring demonstrated sinus rhythm, sinus bradycardia, and sinus tachycardia. Symptoms correlated with sinus bradycardia. There were no significant arrhythmias.  Exercise tolerance test was normal with no evidence of chronotropic incompetence. It was negative for ischemia.  I reviewed the echocardiogram performed at [facility] earlier this year which demonstrated normal left ventricular systolic and diastolic function, LVEF 60 to 65%.  She underwent coronary CT angiography on 10/18/2018 which showed a coronary calcium score of 0 and normal coronary arteries.  She continues to feel exhausted.  She wears a fit bit and her heart rate stays in the low to mid 50 bpm range.  It drops down into the 30 bpm range when she is sleeping.  She has had bilateral ankle swelling, right greater than left.  She is also had areas of numbness on both thighs.  She denies claudication pain.  She has palpitations particular during increased level of work-related stress.  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  She has bilateral knee pain, right greater than left, for which she receives injections for osteoarthritis.   Social history: She works as a Hospital doctor for [employer].  Review of Systems: As per "subjective", otherwise negative.  Allergies  Allergen Reactions  . Celebrex [Celecoxib] Swelling    Facial swelling    Current Outpatient Medications  Medication Sig Dispense Refill  . aspirin 325 MG EC tablet Take 325 mg by mouth daily.    . baclofen (LIORESAL) 10 MG tablet TAKE 1 TABLET BY MOUTH EVERY DAY AS NEEDED FOR MUSCLE SPASMS 30 tablet 0  . Cholecalciferol (VITAMIN D-3) 125 MCG (5000 UT) TABS Take 10,000 Units by mouth daily at 12 noon.    . fluticasone (FLONASE) 50 MCG/ACT nasal spray Place 2 sprays  into both nostrils daily as needed for allergies.     . Misc Natural Products (TART CHERRY ADVANCED PO) Take 1 capsule by mouth daily.     . NP THYROID 120 MG tablet TAKE 1 TABLET BY MOUTH DAILY 30 tablet 3  . pravastatin (PRAVACHOL) 10 MG tablet Take 10 mg by mouth every evening.   0  . TURMERIC PO Take 1 capsule by mouth daily.     No current facility-administered medications for this visit.     Past Medical History:  Diagnosis Date  . DDD (degenerative disc disease), lumbar 01/19/2016  . History of TIA (transient ischemic attack) 01/19/2016  . Hypothyroidism   . Osteoarthritis of both feet 01/19/2016  . Osteoarthritis of both knees 01/19/2016  . PONV (postoperative nausea and vomiting)   . Stroke [facility]) 07/2010   "TIA" per patient  . Vitamin D deficiency disease 11/28/2018    Past Surgical History:  Procedure Laterality Date  . ABDOMINAL HYSTERECTOMY    . BIOPSY  05/15/2018   Procedure: BIOPSY;  Surgeon: Daneil Dolin, MD;  Location: [facility];  Service: Endoscopy;;  polyp cb  . CHOLECYSTECTOMY    . COLONOSCOPY N/A 05/15/2018   Procedure: COLONOSCOPY;  Surgeon: Daneil Dolin, MD;  Location: [facility];  Service: Endoscopy;  Laterality: N/A;  12:00pm  . EYE SURGERY    . KNEE ARTHROSCOPY     x2  . LAPAROSCOPIC ASSISTED VAGINAL HYSTERECTOMY  10/11/2011   Procedure: LAPAROSCOPIC ASSISTED VAGINAL HYSTERECTOMY;  Surgeon: Margarette Asal, MD;  Location: [facility];  Service: Gynecology;  Laterality: N/A;  . LASIK    . POLYPECTOMY  05/15/2018   Procedure: POLYPECTOMY;  Surgeon: Daneil Dolin, MD;  Location: [facility];  Service: Endoscopy;;  colon     Social History   Socioeconomic History  . Marital status: Married    Spouse name: Eddie/Ray  . Number of children: Not on file  . Years of education: Not on file  . Highest education level: Associate degree: occupational, Hotel manager, or vocational program  Occupational History  . Occupation: Personnel officer: [employer]  . Financial resource strain: Not on file  . Food insecurity    Worry: Not on file    Inability: Not on file  . Transportation needs    Medical: Not on file    Non-medical: Not on file  Tobacco Use  . Smoking status: Never Smoker  . Smokeless tobacco: Never Used  Substance and Sexual Activity  . Alcohol use: Yes    Comment: occasionally  . Drug use: No  . Sexual activity: Not on file  Lifestyle  . Physical activity    Days per week: Not on file    Minutes per session: Not on file  . Stress: Not on file  Relationships  . Social Herbalist on phone: Not on file    Gets together: Not on file    Attends religious service: Not on file    Active member of club or organization: Not on file    Attends meetings of clubs or organizations: Not on file    Relationship status: Not on file  . Intimate partner violence    Fear of current or ex partner: Not on file    Emotionally abused: Not on file    Physically abused: Not on file    Forced sexual activity: Not on file  Other Topics Concern  . Not on file  Social History Narrative   Lives with husband and son/family.     Vitals:   12/31/18 1110  BP: 111/70  Pulse: (!) 50  SpO2: 100%  Weight: 158 lb 12.8 oz (72 kg)  Height: 5\' 2"  (1.575 m)    Wt Readings from Last 3 Encounters:  12/31/18 158 lb 12.8 oz (72 kg)  11/28/18 160 lb (72.6 kg)  10/03/18 159 lb 6.4 oz (72.3 kg)     PHYSICAL EXAM General: NAD HEENT: Normal. Neck: No JVD, no thyromegaly. Lungs: Clear to auscultation bilaterally with normal respiratory effort. CV: Regular rate and rhythm, normal S1/S2, no S3/S4, no murmur. No pretibial or periankle edema.  No carotid bruit.   Abdomen: Soft, nontender, no distention.  Neurologic: Alert and oriented.  Psych: Normal affect. Skin: Normal. Musculoskeletal: No gross deformities.      Labs: Lab Results  Component Value Date/Time   K 4.5 11/28/2018 10:42  AM   BUN 9 11/28/2018 10:42 AM   CREATININE 0.62 11/28/2018 10:42 AM   ALT 20 11/28/2018 10:42 AM   TSH 0.01 (L) 11/28/2018 10:42 AM   HGB 13.2 11/28/2018 10:42 AM     Lipids: Lab Results  Component Value Date/Time   LDLCALC 55 11/28/2018 10:42 AM   CHOL 133 11/28/2018 10:42 AM   TRIG 50 11/28/2018 10:42 AM   HDL 66 11/28/2018 10:42 AM       ASSESSMENT AND PLAN: 1.  Bradycardia: HR currently in 50 bpm range.  Event monitoring was unremarkable.  Thyroid function is normal.  She is not on any AV nodal blocking agents to explain bradycardia. There was no evidence of chronotropic incompetence with exercise tolerance testing. Sleep study in March 2020 was benign with some intermittent snoring but no obstructive sleep apnea.  Coronary CT angiography was also normal.  I have asked her to continue to monitor heart rates and to let me know if they drop to the 40 bpm range during awake hours consistently.  2.History ofTIA: Currently on aspirin 325 mg and pravastatin 10 mg.  3. Abnormal ECG with chest tightness:  Coronary CT angiogram reviewed above and found to be normal with a coronary calcium score of 0.  4.  Bilateral ankle swelling: Right greater than left.  Trivial edema at this time.    Disposition: Follow up 6 months   Kate Sable, M.D., F.A.C.C.

## 2018-12-31 NOTE — Patient Instructions (Addendum)
Medication Instructions:  Continue all current medications.  Labwork: none  Testing/Procedures: none  Follow-Up: Your physician wants you to follow up in: 6 months.  You will receive a reminder letter in the mail one-two months in advance.  If you don't receive a letter, please call our office to schedule the follow up appointment   Any Other Special Instructions Will Be Listed Below (If Applicable). Please notify office if heart rate consistently goes to 40 beats per minute range while awake during daytime.    If you need a refill on your cardiac medications before your next appointment, please call your pharmacy.

## 2019-01-27 ENCOUNTER — Other Ambulatory Visit (INDEPENDENT_AMBULATORY_CARE_PROVIDER_SITE_OTHER): Payer: Self-pay | Admitting: Nurse Practitioner

## 2019-01-30 ENCOUNTER — Other Ambulatory Visit: Payer: Self-pay | Admitting: *Deleted

## 2019-01-30 MED ORDER — BACLOFEN 10 MG PO TABS: 10.0000 mg | ORAL_TABLET | Freq: Every day | ORAL | 0 refills | Status: DC | PRN

## 2019-01-30 NOTE — Telephone Encounter (Signed)
Refill request received via fax  Last Visit: 10/03/18 Next Visit: 04/08/19  Okay to refill per Dr. Estanislado Pandy

## 2019-02-26 ENCOUNTER — Other Ambulatory Visit: Payer: Self-pay | Admitting: Rheumatology

## 2019-02-26 NOTE — Telephone Encounter (Signed)
Last Visit: 10/03/2018 Next Visit: 04/08/2019  Okay to refill per Dr. Estanislado Pandy.

## 2019-02-27 ENCOUNTER — Ambulatory Visit (INDEPENDENT_AMBULATORY_CARE_PROVIDER_SITE_OTHER): Payer: 59 | Admitting: Internal Medicine

## 2019-02-27 ENCOUNTER — Encounter (INDEPENDENT_AMBULATORY_CARE_PROVIDER_SITE_OTHER): Payer: Self-pay | Admitting: Internal Medicine

## 2019-02-27 ENCOUNTER — Other Ambulatory Visit: Payer: Self-pay

## 2019-02-27 VITALS — BP 126/70 | HR 67 | Temp 97.1°F | Resp 18 | Ht 62.0 in | Wt 163.0 lb

## 2019-02-27 DIAGNOSIS — R5383 Other fatigue: Secondary | ICD-10-CM

## 2019-02-27 DIAGNOSIS — R5381 Other malaise: Secondary | ICD-10-CM

## 2019-02-27 DIAGNOSIS — G4701 Insomnia due to medical condition: Secondary | ICD-10-CM | POA: Diagnosis not present

## 2019-02-27 DIAGNOSIS — N951 Menopausal and female climacteric states: Secondary | ICD-10-CM

## 2019-02-27 DIAGNOSIS — Z23 Encounter for immunization: Secondary | ICD-10-CM | POA: Diagnosis not present

## 2019-02-27 MED ORDER — ESTRADIOL 0.5 MG PO TABS: 0.5000 mg | ORAL_TABLET | Freq: Every day | ORAL | 3 refills | Status: DC

## 2019-02-27 MED ORDER — PROGESTERONE MICRONIZED 100 MG PO CAPS: 100.0000 mg | ORAL_CAPSULE | Freq: Every day | ORAL | 3 refills | Status: DC

## 2019-02-27 NOTE — Progress Notes (Signed)
Metrics: Intervention Frequency ACO  Documented Smoking Status Yearly  Screened one or more times in 24 months  Cessation Counseling or  Active cessation medication Past 24 months  Past 24 months   Guideline developer: UpToDate (See UpToDate for funding source) Date Released: 2014       Wellness Office Visit  Subjective:  Patient ID: [name], female    DOB: [birth year]  Age: 53 y.o. MRN: [id number]  CC: This patient comes in for follow-up of her multiple medical problems and to discuss bioidentical hormone therapy. HPI I reviewed her blood work with her from the last visit in September and she clearly is in the menopause now and she does describe some hot flashes which are still present although improved.  She describes fatigue.  She describes joint problems which are being treated by her rheumatologist. I looked back also when she was at [facility] for neurological symptoms and this was back in August 2019.  She was told she may have had a TIA.  Her MRI brain scan and bilateral carotid Dopplers were completely normal. She apparently had slurred speech and facial droop at the time. I reviewed her hormone levels with her including testosterone which is suboptimal. Past Medical History:  Diagnosis Date  . DDD (degenerative disc disease), lumbar 01/19/2016  . History of TIA (transient ischemic attack) 01/19/2016  . Hypothyroidism   . Osteoarthritis of both feet 01/19/2016  . Osteoarthritis of both knees 01/19/2016  . PONV (postoperative nausea and vomiting)   . Stroke [facility]) 07/2010   "TIA" per patient  . Vitamin D deficiency disease 11/28/2018      Family History  Problem Relation Age of Onset  . Anuerysm Mother   . Diabetes Father   . Hypertension Father   . Diabetes Sister   . Colon cancer Paternal Uncle     Social History   Social History Narrative   Lives with husband and son/family.Works in Musician for a long time. Has 11 son 51 years old.  Divorced x 1, remarried <1. Has 3 dogs.   Social History   Tobacco Use  . Smoking status: Never Smoker  . Smokeless tobacco: Never Used  Substance Use Topics  . Alcohol use: Yes    Comment: occasionally    Current Meds  Medication Sig  . aspirin 325 MG EC tablet Take 325 mg by mouth daily.  . baclofen (LIORESAL) 10 MG tablet TAKE 1 TABLET(10 MG) BY MOUTH DAILY AS NEEDED FOR MUSCLE SPASMS  . Cholecalciferol (VITAMIN D-3) 125 MCG (5000 UT) TABS Take 10,000 Units by mouth daily at 12 noon.  . Misc Natural Products (TART CHERRY ADVANCED PO) Take 1 capsule by mouth daily.   . NP THYROID 120 MG tablet TAKE 1 TABLET BY MOUTH DAILY  . pravastatin (PRAVACHOL) 10 MG tablet TAKE 1 TABLET BY MOUTH EVERY EVENING  . TURMERIC PO Take 1 capsule by mouth daily.  . [DISCONTINUED] fluticasone (FLONASE) 50 MCG/ACT nasal spray Place 2 sprays into both nostrils daily as needed for allergies.      Nutrition  She has been doing intermittent fasting for usually 16 hours every day and trying to eat healthier. Sleep  Interrupted sleep, she keeps waking up several times at night.  Exercise  No regular exercise. Bio Identical Hormones  None.  Objective:   Today's Vitals: BP 126/70 (BP Location: Right Arm, Patient Position: Sitting, Cuff Size: Normal)   Pulse 67   Temp (!) 97.1 F (36.2 C) (Temporal)  Resp 18   Ht 5\' 2"  (1.575 m)   Wt 163 lb (73.9 kg)   LMP 01/09/2011   SpO2 98% Comment: wearing a mask.  BMI 29.81 kg/m  Vitals with BMI 02/27/2019 12/31/2018 11/28/2018  Height 5\' 2"  5\' 2"  5\' 2"   Weight 163 lbs 158 lbs 13 oz 160 lbs  BMI 29.81 [id number]  Systolic 123XX123 [id number]  Diastolic 70 70 80  Pulse 67 50 64     Physical Exam   She looks systemically well and in fact she has gained about 5 pounds in weight since the last time she was seen.    Assessment   1. Malaise and fatigue   2. Hot flashes due to menopause   3. Insomnia due to medical condition       Tests  ordered No orders of the defined types were placed in this encounter.    Plan: 1. We discussed bioidentical hormone therapy and I discussed the flawed study called the women's health initiative study.  We discussed the difference between synthetic and bioidentical hormones.  We discussed benefits and side effects especially with bioidentical hormones.  Since she is having some symptoms, she is willing to try bioidentical hormones.  I will start her on small doses of estradiol and progesterone.  I discussed possible side effects but I also discussed the benefits.  She will let me know if she has any problems. 2. I will follow up with her in about 6 weeks time to see how she is doing and we will check hormone levels again.  Today I spent 40 minutes with this patient face-to-face, more than 50% of the time was involved in discussing bioidentical hormone therapy.   Meds ordered this encounter  Medications  . estradiol (ESTRACE) 0.5 MG tablet    Sig: Take 1 tablet (0.5 mg total) by mouth daily.    Dispense:  30 tablet    Refill:  3  . progesterone (PROMETRIUM) 100 MG capsule    Sig: Take 1 capsule (100 mg total) by mouth daily.    Dispense:  30 capsule    Refill:  3    Jera Headings Luther Parody, MD

## 2019-02-27 NOTE — Patient Instructions (Signed)

## 2019-03-28 ENCOUNTER — Other Ambulatory Visit: Payer: Self-pay | Admitting: Rheumatology

## 2019-03-28 NOTE — Telephone Encounter (Signed)
Last Visit: 10/03/2018 Next Visit: 04/08/2019  Okay to refill per Dr. Estanislado Pandy.

## 2019-04-08 ENCOUNTER — Ambulatory Visit: Payer: 59 | Admitting: Rheumatology

## 2019-04-09 ENCOUNTER — Ambulatory Visit (INDEPENDENT_AMBULATORY_CARE_PROVIDER_SITE_OTHER): Payer: 59 | Admitting: Internal Medicine

## 2019-04-22 ENCOUNTER — Other Ambulatory Visit: Payer: Self-pay

## 2019-04-22 ENCOUNTER — Ambulatory Visit (INDEPENDENT_AMBULATORY_CARE_PROVIDER_SITE_OTHER): Payer: 59 | Admitting: Nurse Practitioner

## 2019-04-22 VITALS — BP 110/80 | HR 50 | Temp 97.7°F | Ht 65.0 in | Wt 153.0 lb

## 2019-04-22 DIAGNOSIS — Z780 Asymptomatic menopausal state: Secondary | ICD-10-CM | POA: Insufficient documentation

## 2019-04-22 DIAGNOSIS — R5383 Other fatigue: Secondary | ICD-10-CM

## 2019-04-22 DIAGNOSIS — N951 Menopausal and female climacteric states: Secondary | ICD-10-CM | POA: Diagnosis not present

## 2019-04-22 DIAGNOSIS — R5381 Other malaise: Secondary | ICD-10-CM | POA: Diagnosis not present

## 2019-04-22 DIAGNOSIS — R002 Palpitations: Secondary | ICD-10-CM | POA: Diagnosis not present

## 2019-04-22 DIAGNOSIS — E559 Vitamin D deficiency, unspecified: Secondary | ICD-10-CM | POA: Diagnosis not present

## 2019-04-22 NOTE — Assessment & Plan Note (Signed)
She will continue on her desiccated thyroid.  We will collect serum thyroid panel for further evaluation.

## 2019-04-22 NOTE — Assessment & Plan Note (Addendum)
I will send her for second opinion to another cardiologist for further evaluation of her cardiac palpitations and bradycardia.  I did discuss that palpitations can be a side effect of her NP thyroid.  However she tells me the palpitations have been going on prior to starting NP thyroid and have been pretty stable and not changed much since starting NP thyroid.  For now she will continue her NP thyroid dosing we will check serum level for further evaluation.

## 2019-04-22 NOTE — Assessment & Plan Note (Signed)
We will collect serum estradiol and progesterone levels for further evaluation today patient is tolerating the medication well.  For now she will continue on current medication regimen.

## 2019-04-22 NOTE — Assessment & Plan Note (Signed)
She will continue on her supplemental dose of vitamin D3 at 10,000 IUs daily.  We will check a serum level today for further evaluation.

## 2019-04-22 NOTE — Progress Notes (Signed)
Subjective:  Patient ID: [name], female    DOB: 28-Feb-1966  Age: 54 y.o. MRN: [id number]  CC:  Chief Complaint  Patient presents with  . Follow-up    BHRT (menopause), vitamin D deficiency, fatigue      HPI  This patient arrives for follow-up for the above.  Menopause: This patient started on bioidentical hormone replacement therapy in December 2020 to treat for symptoms of menopause.  She tells me she was having mood swings, insomnia, and night sweats.  She is currently on estradiol and progesterone and is tolerating these medications well.  She tells me her symptoms have improved dramatically.  She denies any vaginal bleeding, breast masses, or breast tenderness.  Mammogram is done by her OB/GYN outside of the [facility].  She tells me she has had one within the last year and it was within normal limits.  Vitamin D deficiency: Patient continues on 10,000 IUs of vitamin D3 daily.  Last serum level was collected in September 2020 and it was 13.  Fatigue: This patient is being treated with desiccated thyroid, off label, for symptoms of thyroid deficiency.  The patient has been counseled regarding side effects and how to deal with them.  She does mention that she has cardiac palpitations on a regular basis.  This is been a concern prior to starting on desiccated thyroid.  She has been worked up by cardiology in the past and all testing has been negative.  She would like a second opinion because her palpitations are concerning to her.  Last thyroid panel was collected in September 2020.    Past Medical History:  Diagnosis Date  . DDD (degenerative disc disease), lumbar 01/19/2016  . History of TIA (transient ischemic attack) 01/19/2016  . Hypothyroidism   . Osteoarthritis of both feet 01/19/2016  . Osteoarthritis of both knees 01/19/2016  . PONV (postoperative nausea and vomiting)   . Stroke [facility]) 07/2010   "TIA" per patient  . Vitamin D deficiency disease 11/28/2018        Family History  Problem Relation Age of Onset  . Anuerysm Mother   . Diabetes Father   . Hypertension Father   . Diabetes Sister   . Colon cancer Paternal Uncle     Social History   Social History Narrative   Lives with husband and son/family.Works in [employer] for a long time. Has 23 son 39 years old. Divorced x 1, remarried <1. Has 3 dogs.   Social History   Tobacco Use  . Smoking status: Never Smoker  . Smokeless tobacco: Never Used  Substance Use Topics  . Alcohol use: Yes    Comment: occasionally     Current Meds  Medication Sig  . aspirin 325 MG EC tablet Take 325 mg by mouth daily.  . baclofen (LIORESAL) 10 MG tablet TAKE 1 TABLET(10 MG) BY MOUTH DAILY AS NEEDED FOR MUSCLE SPASMS  . Cholecalciferol (VITAMIN D-3) 125 MCG (5000 UT) TABS Take 10,000 Units by mouth daily at 12 noon.  Marland Kitchen estradiol (ESTRACE) 0.5 MG tablet Take 1 tablet (0.5 mg total) by mouth daily.  . Misc Natural Products (TART CHERRY ADVANCED PO) Take 1 capsule by mouth daily.   . pravastatin (PRAVACHOL) 10 MG tablet TAKE 1 TABLET BY MOUTH EVERY EVENING  . progesterone (PROMETRIUM) 100 MG capsule Take 1 capsule (100 mg total) by mouth daily.  . TURMERIC PO Take 1 capsule by mouth daily.    ROS:  Review of  Systems  Constitutional: Positive for malaise/fatigue. Negative for fever.  Eyes: Negative for blurred vision and double vision.  Respiratory: Positive for shortness of breath (intermittent and can catch breath when she stops and rests). Negative for cough and wheezing.   Cardiovascular: Positive for palpitations. Negative for chest pain.  Gastrointestinal: Negative for constipation and diarrhea.  Neurological: Positive for dizziness. Negative for headaches.  Psychiatric/Behavioral: The patient is nervous/anxious (feels stressed at times especially related to her job at times).      Objective:   Today's Vitals: BP 110/80 (BP Location: Right Arm, Patient Position: Sitting, Cuff  Size: Normal)   Pulse (!) 50   Temp 97.7 F (36.5 C) (Temporal)   Ht '5\' 5"'  (1.651 m)   Wt 153 lb (69.4 kg)   LMP 01/09/2011   BMI 25.46 kg/m  Vitals with BMI 04/22/2019 02/27/2019 12/31/2018  Height '5\' 5"'  '5\' 2"'  '5\' 2"'   Weight 153 lbs 163 lbs 158 lbs 13 oz  BMI 25.46 73.53 29.92  Systolic 426 834 196  Diastolic 80 70 70  Pulse 50 67 50     Physical Exam Vitals reviewed.  Constitutional:      General: She is not in acute distress.    Appearance: Normal appearance.  HENT:     Head: Normocephalic and atraumatic.  Neck:     Vascular: No carotid bruit.  Cardiovascular:     Rate and Rhythm: Normal rate and regular rhythm.     Pulses: Normal pulses.     Heart sounds: Normal heart sounds.  Pulmonary:     Effort: Pulmonary effort is normal.     Breath sounds: Normal breath sounds.  Skin:    General: Skin is warm and dry.  Neurological:     General: No focal deficit present.     Mental Status: She is alert and oriented to person, place, and time.  Psychiatric:        Mood and Affect: Mood normal.        Behavior: Behavior normal.        Judgment: Judgment normal.          Assessment   1. Hot flashes due to menopause   2. Vitamin D deficiency disease   3. Malaise and fatigue   4. Palpitations       Tests ordered Orders Placed This Encounter  Procedures  . Estradiol  . Progesterone  . Vitamin D, 25-hydroxy  . TSH  . T3, Free  . T4, Free  . CMP with eGFR([facility])  . Ambulatory referral to Cardiology     Plan: Please see assessment and plan per problem list below.   No orders of the defined types were placed in this encounter.     Ailene Ards, NP

## 2019-04-22 NOTE — Patient Instructions (Signed)
Thank you for choosing [name] as your medical provider! If you have any questions or concerns regarding your health care, please do not hesitate to call our office.  I am collecting blood work today.  I will release the results in my chart and provide recommendations if there are any changes we need to make to medications.  I have sent referral for second opinion to cardiology group in [place].  He should hear from their office within 2 weeks to set up an appointment.  If you do not please call this office and let us know.  For now continue all medications as prescribed.  Please follow-up as scheduled in 3 months. We look forward to seeing you again soon!   At [facility] we value your feedback. You may receive a survey about your visit today. Please share your experience as we strive to create trusting relationships with our patients to provide genuine, compassionate, quality care.  We appreciate your understanding and patience as we review any laboratory studies, imaging, and other diagnostic tests that are ordered as we care for you. We do our best to address any and all results in a timely manner. If you do not hear about test results within 1 week, please do not hesitate to contact us. If we referred you to a specialist during your visit or ordered imaging testing, contact the office if you have not been contacted to be scheduled within 1 weeks.  We also encourage the use of MyChart, which contains your medical information for your review as well. If you are not enrolled in this feature, an access code is on this after visit summary for your convenience. Thank you for allowing [place] to be involved in your care.

## 2019-04-23 ENCOUNTER — Encounter (INDEPENDENT_AMBULATORY_CARE_PROVIDER_SITE_OTHER): Payer: Self-pay | Admitting: Nurse Practitioner

## 2019-04-23 ENCOUNTER — Other Ambulatory Visit (INDEPENDENT_AMBULATORY_CARE_PROVIDER_SITE_OTHER): Payer: Self-pay | Admitting: Internal Medicine

## 2019-04-23 LAB — COMPLETE METABOLIC PANEL WITH GFR
AG Ratio: 1.3 (calc) (ref 1.0–2.5)
ALT: 10 U/L (ref 6–29)
AST: 15 U/L (ref 10–35)
Albumin: 4.1 g/dL (ref 3.6–5.1)
Alkaline phosphatase (APISO): 51 U/L (ref 37–153)
BUN: 10 mg/dL (ref 7–25)
CO2: 27 mmol/L (ref 20–32)
Calcium: 9.2 mg/dL (ref 8.6–10.4)
Chloride: 105 mmol/L (ref 98–110)
Creat: 0.63 mg/dL (ref 0.50–1.05)
GFR, Est African American: 120 mL/min/{1.73_m2} (ref >=60)
GFR, Est Non African American: 104 mL/min/{1.73_m2} (ref >=60)
Globulin: 3.1 g/dL (calc) (ref 1.9–3.7)
Glucose, Bld: 95 mg/dL (ref 65–99)
Potassium: 4.6 mmol/L (ref 3.5–5.3)
Sodium: 142 mmol/L (ref 135–146)
Total Bilirubin: 0.9 mg/dL (ref 0.2–1.2)
Total Protein: 7.2 g/dL (ref 6.1–8.1)

## 2019-04-23 LAB — T4, FREE: Free T4: 1.1 ng/dL (ref 0.8–1.8)

## 2019-04-23 LAB — ESTRADIOL: Estradiol: 55 pg/mL

## 2019-04-23 LAB — VITAMIN D 25 HYDROXY (VIT D DEFICIENCY, FRACTURES): Vit D, 25-Hydroxy: 67 ng/mL (ref 30–100)

## 2019-04-23 LAB — PROGESTERONE: Progesterone: 5.2 ng/mL

## 2019-04-23 LAB — T3, FREE: T3, Free: 4.3 pg/mL — ABNORMAL HIGH (ref 2.3–4.2)

## 2019-04-23 LAB — TSH: TSH: 0.02 mIU/L — ABNORMAL LOW

## 2019-04-23 MED ORDER — PROGESTERONE MICRONIZED 200 MG PO CAPS: 200.0000 mg | ORAL_CAPSULE | Freq: Every day | ORAL | 3 refills | Status: DC

## 2019-04-23 NOTE — Progress Notes (Signed)
Patient called.  Pt given lab results. Instructions to INCREASE Progesterone  200 mg Daily in PM. To start taking 2 capsule the PM. Then go pick up new RX at her pharmacy. Also for the palpations to STOP thyroid medication for 2 weeks & see what Cardiologist recommends. But pt stated she was having them before she started thyroid medications, so she will probably still take until she see Dr Bronson Ing on up coming visit.

## 2019-04-24 NOTE — Progress Notes (Signed)
Patient called on 04/23/19 and given instruction for medication and lab results.

## 2019-04-27 ENCOUNTER — Other Ambulatory Visit: Payer: Self-pay | Admitting: Rheumatology

## 2019-04-27 ENCOUNTER — Other Ambulatory Visit (INDEPENDENT_AMBULATORY_CARE_PROVIDER_SITE_OTHER): Payer: Self-pay | Admitting: Internal Medicine

## 2019-04-28 NOTE — Telephone Encounter (Signed)
Last Visit: 10/03/2018 Next Visit: 05/05/2019  Okay to refill per Dr. Estanislado Pandy.

## 2019-04-29 NOTE — Progress Notes (Deleted)
Office Visit Note  Patient: [name]             Date of Birth: 03/24/1965           MRN: [id number]             PCP: Doree Albee, MD Referring: Doree Albee, MD Visit Date: 05/05/2019 Occupation: @GUAROCC @  Subjective:  No chief complaint on file.   History of Present Illness: [name] is a 54 y.o. female ***   Activities of Daily Living:  Patient reports morning stiffness for *** {minute/hour:19697}.   Patient {ACTIONS;DENIES/REPORTS:21021675::"Denies"} nocturnal pain.  Difficulty dressing/grooming: {ACTIONS;DENIES/REPORTS:21021675::"Denies"} Difficulty climbing stairs: {ACTIONS;DENIES/REPORTS:21021675::"Denies"} Difficulty getting out of chair: {ACTIONS;DENIES/REPORTS:21021675::"Denies"} Difficulty using hands for taps, buttons, cutlery, and/or writing: {ACTIONS;DENIES/REPORTS:21021675::"Denies"}  No Rheumatology ROS completed.   PMFS History:  Patient Active Problem List   Diagnosis Date Noted  . Palpitation 04/22/2019  . Fatigue 04/22/2019  . Menopause 04/22/2019  . Vitamin D deficiency disease 11/28/2018  . Abdominal pain 03/21/2018  . Nausea with vomiting 03/21/2018  . Primary osteoarthritis of both knees 09/24/2017  . Osteoarthritis of both knees 01/19/2016  . Osteoarthritis of both feet 01/19/2016  . History of TIA (transient ischemic attack) 01/19/2016  . DDD (degenerative disc disease), lumbar 01/19/2016  . Nodular episcleritis of both eyes 01/19/2016  . Pain in right knee 01/19/2016  . Leiomyoma 10/12/2011  . Female pelvic pain 10/12/2011    Past Medical History:  Diagnosis Date  . DDD (degenerative disc disease), lumbar 01/19/2016  . History of TIA (transient ischemic attack) 01/19/2016  . Hypothyroidism   . Osteoarthritis of both feet 01/19/2016  . Osteoarthritis of both knees 01/19/2016  . PONV (postoperative nausea and vomiting)   . Stroke [facility]) 07/2010   "TIA" per patient  . Vitamin D deficiency disease 11/28/2018      Family History  Problem Relation Age of Onset  . Anuerysm Mother   . Diabetes Father   . Hypertension Father   . Diabetes Sister   . Colon cancer Paternal Uncle    Past Surgical History:  Procedure Laterality Date  . ABDOMINAL HYSTERECTOMY    . BIOPSY  05/15/2018   Procedure: BIOPSY;  Surgeon: Daneil Dolin, MD;  [facility];  Service: Endoscopy;;  polyp cb  . CHOLECYSTECTOMY    . COLONOSCOPY N/A 05/15/2018   Procedure: COLONOSCOPY;  Surgeon: Daneil Dolin, MD;  [facility];  Service: Endoscopy;  Laterality: N/A;  12:00pm  . EYE SURGERY    . KNEE ARTHROSCOPY     x2  . LAPAROSCOPIC ASSISTED VAGINAL HYSTERECTOMY  10/11/2011   Procedure: LAPAROSCOPIC ASSISTED VAGINAL HYSTERECTOMY;  Surgeon: Margarette Asal, MD;  Location: [facility];  Service: Gynecology;  Laterality: N/A;  . LASIK    . POLYPECTOMY  05/15/2018   Procedure: POLYPECTOMY;  Surgeon: Daneil Dolin, MD;  [facility];  Service: Endoscopy;;  colon    Social History   Social History Narrative   Lives with husband and son/family.Works in [employer] for a long time. Has 64 son 64 years old. Divorced x 1, remarried <1. Has 3 dogs.   Immunization History  Administered Date(s) Administered  . Influenza,inj,Quad PF,6+ Mos 02/27/2019     Objective: Vital Signs: LMP 01/09/2011    Physical Exam   Musculoskeletal Exam: ***  CDAI Exam: CDAI Score: -- Patient Global: --; Provider Global: -- Swollen: --; Tender: -- Joint Exam 05/05/2019   No joint exam has been  documented for this visit   There is currently no information documented on the homunculus. Go to the Rheumatology activity and complete the homunculus joint exam.  Investigation: No additional findings.  Imaging: No results found.  Recent Labs: Lab Results  Component Value Date   WBC 4.7 11/28/2018   HGB 13.2 11/28/2018   PLT 280 11/28/2018   NA 142 04/22/2019   K 4.6 04/22/2019   CL 105 04/22/2019   CO2 27  04/22/2019   GLUCOSE 95 04/22/2019   BUN 10 04/22/2019   CREATININE 0.63 04/22/2019   BILITOT 0.9 04/22/2019   ALKPHOS 50 07/13/2016   AST 15 04/22/2019   ALT 10 04/22/2019   PROT 7.2 04/22/2019   ALBUMIN 3.8 07/13/2016   CALCIUM 9.2 04/22/2019   GFRAA 120 04/22/2019    Speciality Comments: No specialty comments available.  Procedures:  No procedures performed Allergies: Celebrex [celecoxib]   Assessment / Plan:     Visit Diagnoses: No diagnosis found.  Orders: No orders of the defined types were placed in this encounter.  No orders of the defined types were placed in this encounter.   Face-to-face time spent with patient was *** minutes. Greater than 50% of time was spent in counseling and coordination of care.  Follow-Up Instructions: No follow-ups on file.   Earnestine Mealing, CMA  Note - This record has been created using Editor, commissioning.  Chart creation errors have been sought, but may not always  have been located. Such creation errors do not reflect on  the standard of medical care.

## 2019-05-05 ENCOUNTER — Ambulatory Visit: Payer: 59 | Admitting: Physician Assistant

## 2019-05-09 NOTE — Progress Notes (Signed)
Office Visit Note  Patient: [name]             Date of Birth: 1965/08/19           MRN: [id number]             PCP: Doree Albee, MD Referring: Doree Albee, MD Visit Date: 05/14/2019 Occupation: @GUAROCC @  Subjective:  Left SI joint pain   History of Present Illness: [name] is a 54 y.o. female with history of nodular episcleritis, osteoarthritis, and DDD.  She denies any signs or symptoms of a episcleritis flare.  She is not having any eye pain redness or dryness at this time.  She presents today with increased lower back pain.  She is having discomfort in the left SI joint.  She denies any symptoms of sciatica.  She states that the discomfort is likely exacerbated by being more sedentary while working from home.  She has been taking Tylenol arthritis and baclofen as needed for pain relief.  Activities of Daily Living:  Patient reports morning stiffness for 2 hours.   Patient Reports nocturnal pain.  Difficulty dressing/grooming: Denies Difficulty climbing stairs: Denies Difficulty getting out of chair: Denies Difficulty using hands for taps, buttons, cutlery, and/or writing: Reports  Review of Systems  Constitutional: Positive for fatigue.  HENT: Negative for mouth sores, mouth dryness and nose dryness.   Eyes: Negative for pain, itching, visual disturbance and dryness.  Respiratory: Negative for cough, hemoptysis, shortness of breath and difficulty breathing.   Cardiovascular: Positive for palpitations. Negative for chest pain, hypertension and swelling in legs/feet.       Sees cardiology  Gastrointestinal: Negative for blood in stool, constipation and diarrhea.  Endocrine: Negative for increased urination.  Genitourinary: Negative for difficulty urinating and painful urination.  Musculoskeletal: Positive for arthralgias, joint pain, joint swelling and morning stiffness. Negative for myalgias, muscle weakness, muscle tenderness and myalgias.  Skin:  Negative for color change, pallor, rash, hair loss, nodules/bumps, skin tightness, ulcers and sensitivity to sunlight.  Allergic/Immunologic: Negative for susceptible to infections.  Neurological: Negative for dizziness, numbness, headaches and memory loss.  Hematological: Negative for swollen glands.  Psychiatric/Behavioral: Negative for depressed mood, confusion and sleep disturbance. The patient is not nervous/anxious.     PMFS History:  Patient Active Problem List   Diagnosis Date Noted  . Palpitation 04/22/2019  . Fatigue 04/22/2019  . Menopause 04/22/2019  . Vitamin D deficiency disease 11/28/2018  . Abdominal pain 03/21/2018  . Nausea with vomiting 03/21/2018  . Primary osteoarthritis of both knees 09/24/2017  . Osteoarthritis of both knees 01/19/2016  . Osteoarthritis of both feet 01/19/2016  . History of TIA (transient ischemic attack) 01/19/2016  . DDD (degenerative disc disease), lumbar 01/19/2016  . Nodular episcleritis of both eyes 01/19/2016  . Pain in right knee 01/19/2016  . Leiomyoma 10/12/2011  . Female pelvic pain 10/12/2011    Past Medical History:  Diagnosis Date  . DDD (degenerative disc disease), lumbar 01/19/2016  . History of TIA (transient ischemic attack) 01/19/2016  . Hypothyroidism   . Osteoarthritis of both feet 01/19/2016  . Osteoarthritis of both knees 01/19/2016  . PONV (postoperative nausea and vomiting)   . Stroke [facility]) 07/2010   "TIA" per patient  . Vitamin D deficiency disease 11/28/2018    Family History  Problem Relation Age of Onset  . Anuerysm Mother   . Diabetes Father   . Hypertension Father   . Diabetes Sister   . Osteoarthritis  Sister   . Colon cancer Paternal Uncle    Past Surgical History:  Procedure Laterality Date  . ABDOMINAL HYSTERECTOMY    . BIOPSY  05/15/2018   Procedure: BIOPSY;  Surgeon: Daneil Dolin, MD;  [facility];  Service: Endoscopy;;  polyp cb  . CHOLECYSTECTOMY    . COLONOSCOPY N/A 05/15/2018     Procedure: COLONOSCOPY;  Surgeon: Daneil Dolin, MD;  [facility];  Service: Endoscopy;  Laterality: N/A;  12:00pm  . EYE SURGERY    . KNEE ARTHROSCOPY     x2  . LAPAROSCOPIC ASSISTED VAGINAL HYSTERECTOMY  10/11/2011   Procedure: LAPAROSCOPIC ASSISTED VAGINAL HYSTERECTOMY;  Surgeon: Margarette Asal, MD;  Location: [facility];  Service: Gynecology;  Laterality: N/A;  . LASIK    . POLYPECTOMY  05/15/2018   Procedure: POLYPECTOMY;  Surgeon: Daneil Dolin, MD;  [facility];  Service: Endoscopy;;  colon    Social History   Social History Narrative   Lives with husband and son/family.Works in [employer] for a long time. Has 47 son 1 years old. Divorced x 1, remarried <1. Has 3 dogs.   Immunization History  Administered Date(s) Administered  . Influenza,inj,Quad PF,6+ Mos 02/27/2019     Objective: Vital Signs: BP 138/77 (BP Location: Right Arm, Patient Position: Sitting, Cuff Size: Normal)   Pulse 63   Resp 13   Ht 5\' 2"  (1.575 m)   Wt 165 lb 3.2 oz (74.9 kg)   LMP 01/09/2011   BMI 30.22 kg/m    Physical Exam Vitals and nursing note reviewed.  Constitutional:      Appearance: She is well-developed.  HENT:     Head: Normocephalic and atraumatic.  Eyes:     Conjunctiva/sclera: Conjunctivae normal.  Cardiovascular:     Rate and Rhythm: Normal rate and regular rhythm.     Heart sounds: Normal heart sounds.  Pulmonary:     Effort: Pulmonary effort is normal.     Breath sounds: Normal breath sounds.  Abdominal:     General: Bowel sounds are normal.     Palpations: Abdomen is soft.  Musculoskeletal:     Cervical back: Normal range of motion.  Lymphadenopathy:     Cervical: No cervical adenopathy.  Skin:    General: Skin is warm and dry.     Capillary Refill: Capillary refill takes less than 2 seconds.  Neurological:     Mental Status: She is alert and oriented to person, place, and time.  Psychiatric:        Behavior: Behavior normal.       Musculoskeletal Exam: C-spine, thoracic spine, and lumbar spine good ROM.  No midline spinal tenderness.  Left SI joint tenderness.  Shoulder joints, elbow joints, wrist joints, MCPs, PIPs, and DIPs good ROM with no synovitis.  DIP synovial thickening.  Hip joints good ROM with no discomfort.  Right knee warmth but no effusion noted.  Left knee has good ROM with no warmth or effusion.  Ankle joints good ROM with no tenderness or synovitis.   CDAI Exam: CDAI Score: -- Patient Global: --; Provider Global: -- Swollen: --; Tender: -- Joint Exam 05/14/2019   No joint exam has been documented for this visit   There is currently no information documented on the homunculus. Go to the Rheumatology activity and complete the homunculus joint exam.  Investigation: No additional findings.  Imaging: No results found.  Recent Labs: Lab Results  Component Value Date  WBC 4.7 11/28/2018   HGB 13.2 11/28/2018   PLT 280 11/28/2018   NA 142 04/22/2019   K 4.6 04/22/2019   CL 105 04/22/2019   CO2 27 04/22/2019   GLUCOSE 95 04/22/2019   BUN 10 04/22/2019   CREATININE 0.63 04/22/2019   BILITOT 0.9 04/22/2019   ALKPHOS 50 07/13/2016   AST 15 04/22/2019   ALT 10 04/22/2019   PROT 7.2 04/22/2019   ALBUMIN 3.8 07/13/2016   CALCIUM 9.2 04/22/2019   GFRAA 120 04/22/2019    Speciality Comments: No specialty comments available.  Procedures:  No procedures performed Allergies: Celebrex [celecoxib]   Assessment / Plan:     Visit Diagnoses: Nodular episcleritis of both eyes: She has not had any signs or symptoms of a flare recently.  She has no eye pain, photophobia, dryness, or conjunctival injection.  She was advised to notify us if she experiences signs of a flare.  Primary osteoarthritis of both knees: She has worked but no effusion of the right knee joint on exam.  She has painful range of motion of the right knee.  Left knee has good range of motion with no warmth or effusion.  She had a  right knee joint cortisone injection on 10/03/2018 which provided significant relief.  Primary osteoarthritis of both feet: She is not having any discomfort in her feet at this time.  No joint swelling noted.  DDD (degenerative disc disease), lumbar -She has good range of motion of the lumbar spine.  No midline spinal tenderness.  She is not having any symptoms of radiculopathy.  She has been experiencing increased lower back pain for the past 2 weeks.  She attributes her discomfort to being more sedentary since working from home.  X-rays of the lumbar spine were obtained today.  Plan: XR Lumbar Spine 2-3 Views  Chronic right SI joint pain - She has no right SI joint tenderness on exam today.  X-ray of the pelvis were obtained.  She had a right SI joint cortisone junction on 04/04/2018 which provided significant relief plan: XR Pelvis 1-2 Views  Chronic left SI joint pain -She has left SI joint tenderness on exam.  She has been experiencing left SI joint pain for the past 2 weeks.  She attributes her discomfort to being more sedentary since working from home.  She is not having any symptoms of sciatica.  We discussed the importance of changing positions and performing stretching exercises frequently.  A handout of exercises were provided to the patient. X-rays of the pelvis were obtained.  Plan: XR Pelvis 1-2 Views  Other medical conditions are listed as follows:   History of TIA (transient ischemic attack)  History of cholecystectomy  Orders: Orders Placed This Encounter  Procedures  . XR Lumbar Spine 2-3 Views  . XR Pelvis 1-2 Views   No orders of the defined types were placed in this encounter.   Face-to-face time spent with patient was 30 minutes. Greater than 50% of time was spent in counseling and coordination of care.  Follow-Up Instructions: Return in about 6 months (around 11/11/2019) for Osteoarthritis, DDD.   Ofilia Neas, PA-C   I examined and evaluated the patient with  Hazel Sams PA.  Continues to have some lower back pain and SI joint pain.  I reviewed the x-ray results with the patient.  Core muscle strengthening exercises were emphasized.  A handout on exercises was provided.  The plan of care was discussed as noted above.  Bo Merino,  MD  Note - This record has been created using Editor, commissioning.  Chart creation errors have been sought, but may not always  have been located. Such creation errors do not reflect on  the standard of medical care.

## 2019-05-12 ENCOUNTER — Telehealth: Payer: 59 | Admitting: Internal Medicine

## 2019-05-14 ENCOUNTER — Ambulatory Visit: Payer: Self-pay

## 2019-05-14 ENCOUNTER — Ambulatory Visit: Payer: 59 | Admitting: Physician Assistant

## 2019-05-14 ENCOUNTER — Other Ambulatory Visit: Payer: Self-pay

## 2019-05-14 ENCOUNTER — Encounter: Payer: Self-pay | Admitting: Physician Assistant

## 2019-05-14 VITALS — BP 138/77 | HR 63 | Resp 13 | Ht 62.0 in | Wt 165.2 lb

## 2019-05-14 DIAGNOSIS — M533 Sacrococcygeal disorders, not elsewhere classified: Secondary | ICD-10-CM

## 2019-05-14 DIAGNOSIS — M19071 Primary osteoarthritis, right ankle and foot: Secondary | ICD-10-CM | POA: Diagnosis not present

## 2019-05-14 DIAGNOSIS — M19072 Primary osteoarthritis, left ankle and foot: Secondary | ICD-10-CM

## 2019-05-14 DIAGNOSIS — G8929 Other chronic pain: Secondary | ICD-10-CM

## 2019-05-14 DIAGNOSIS — M5136 Other intervertebral disc degeneration, lumbar region: Secondary | ICD-10-CM

## 2019-05-14 DIAGNOSIS — M170 Bilateral primary osteoarthritis of knee: Secondary | ICD-10-CM

## 2019-05-14 DIAGNOSIS — Z8673 Personal history of transient ischemic attack (TIA), and cerebral infarction without residual deficits: Secondary | ICD-10-CM

## 2019-05-14 DIAGNOSIS — H15123 Nodular episcleritis, bilateral: Secondary | ICD-10-CM

## 2019-05-14 DIAGNOSIS — Z9049 Acquired absence of other specified parts of digestive tract: Secondary | ICD-10-CM

## 2019-05-14 NOTE — Patient Instructions (Addendum)
Sacroiliac Joint Dysfunction  Sacroiliac joint dysfunction is a condition that causes inflammation on one or both sides of the sacroiliac (SI) joint. The SI joint connects the lower part of the spine (sacrum) with the two upper portions of the pelvis (ilium). This condition causes deep aching or burning pain in the low back. In some cases, the pain may also spread into one or both buttocks, hips, or thighs. What are the causes? This condition may be caused by:  Pregnancy. During pregnancy, extra stress is put on the SI joints because the pelvis widens.  Injury, such as: ? Injuries from car accidents. ? Sports-related injuries. ? Work-related injuries.  Having one leg that is shorter than the other.  Conditions that affect the joints, such as: ? Rheumatoid arthritis. ? Gout. ? Psoriatic arthritis. ? Joint infection (septic arthritis). Sometimes, the cause of SI joint dysfunction is not known. What are the signs or symptoms? Symptoms of this condition include:  Aching or burning pain in the lower back. The pain may also spread to other areas, such as: ? Buttocks. ? Groin. ? Thighs.  Muscle spasms in or around the painful areas.  Increased pain when standing, walking, running, stair climbing, bending, or lifting. How is this diagnosed? This condition is diagnosed with a physical exam and medical history. During the exam, the health care provider may move one or both of your legs to different positions to check for pain. Various tests may be done to confirm the diagnosis, including:  Imaging tests to look for other causes of pain. These may include: ? MRI. ? CT scan. ? Bone scan.  Diagnostic injection. A numbing medicine is injected into the SI joint using a needle. If your pain is temporarily improved or stopped after the injection, this can indicate that SI joint dysfunction is the problem. How is this treated? Treatment depends on the cause and severity of your condition.  Treatment options may include:  Ice or heat applied to the lower back area after an injury. This may help reduce pain and muscle spasms.  Medicines to relieve pain or inflammation or to relax the muscles.  Wearing a back brace (sacroiliac brace) to help support the joint while your back is healing.  Physical therapy to increase muscle strength around the joint and flexibility at the joint. This may also involve learning proper body positions and ways of moving to relieve stress on the joint.  Direct manipulation of the SI joint.  Injections of steroid medicine into the joint to reduce pain and swelling.  Radiofrequency ablation to burn away nerves that are carrying pain messages from the joint.  Use of a device that provides electrical stimulation to help reduce pain at the joint.  Surgery to put in screws and plates that limit or prevent joint motion. This is rare. Follow these instructions at home: Medicines  Take over-the-counter and prescription medicines only as told by your health care provider.  Do not drive or use heavy machinery while taking prescription pain medicine.  If you are taking prescription pain medicine, take actions to prevent or treat constipation. Your health care provider may recommend that you: ? Drink enough fluid to keep your urine pale yellow. ? Eat foods that are high in fiber, such as fresh fruits and vegetables, whole grains, and beans. ? Limit foods that are high in fat and processed sugars, such as fried or sweet foods. ? Take an over-the-counter or prescription medicine for constipation. If you have a brace:    Wear the brace as told by your health care provider. Remove it only as told by your health care provider.  Keep the brace clean.  If the brace is not waterproof: ? Do not let it get wet. ? Cover it with a watertight covering when you take a bath or a shower. Managing pain, stiffness, and swelling      Icing can help with pain and  swelling. Heat may help with muscle tension or spasms. Ask your health care provider if you should use ice or heat.  If directed, put ice on the affected area: ? If you have a removable brace, remove it as told by your health care provider. ? Put ice in a plastic bag. ? Place a towel between your skin and the bag. ? Leave the ice on for 20 minutes, 2-3 times a day.  If directed, apply heat to the affected area. Use the heat source that your health care provider recommends, such as a moist heat pack or a heating pad. ? Place a towel between your skin and the heat source. ? Leave the heat on for 20-30 minutes. ? Remove the heat if your skin turns bright red. This is especially important if you are unable to feel pain, heat, or cold. You may have a greater risk of getting burned. General instructions  Rest as needed. Ask your health care provider what activities are safe for you.  Return to your normal activities as told by your health care provider.  Exercise as directed by your health care provider or physical therapist.  Do not use any products that contain nicotine or tobacco, such as cigarettes and e-cigarettes. These can delay bone healing. If you need help quitting, ask your health care provider.  Keep all follow-up visits as told by your health care provider. This is important. Contact a health care provider if:  Your pain is not controlled with medicine.  You have a fever.  Your pain is getting worse. Get help right away if:  You have weakness, numbness, or tingling in your legs or feet.  You lose control of your bladder or bowel. Summary  Sacroiliac joint dysfunction is a condition that causes inflammation on one or both sides of the sacroiliac (SI) joint.  This condition causes deep aching or burning pain in the low back. In some cases, the pain may also spread into one or both buttocks, hips, or thighs.  Treatment depends on the cause and severity of your condition.  It may include medicines to reduce pain and swelling or to relax muscles. This information is not intended to replace advice given to you by your health care provider. Make sure you discuss any questions you have with your health care provider. Document Revised: 10/31/2017 Document Reviewed: 04/16/2017 Elsevier Patient Education  2020 [place].  Back Exercises The following exercises strengthen the muscles that help to support the trunk and back. They also help to keep the lower back flexible. Doing these exercises can help to prevent back pain or lessen existing pain.  If you have back pain or discomfort, try doing these exercises 2-3 times each day or as told by your health care provider.  As your pain improves, do them once each day, but increase the number of times that you repeat the steps for each exercise (do more repetitions).  To prevent the recurrence of back pain, continue to do these exercises once each day or as told by your health care provider. Do exercises exactly  as told by your health care provider and adjust them as directed. It is normal to feel mild stretching, pulling, tightness, or discomfort as you do these exercises, but you should stop right away if you feel sudden pain or your pain gets worse. Exercises Single knee to chest Repeat these steps 3-5 times for each leg: 1. Lie on your back on a firm bed or the floor with your legs extended. 2. Bring one knee to your chest. Your other leg should stay extended and in contact with the floor. 3. Hold your knee in place by grabbing your knee or thigh with both hands and hold. 4. Pull on your knee until you feel a gentle stretch in your lower back or buttocks. 5. Hold the stretch for 10-30 seconds. 6. Slowly release and straighten your leg. Pelvic tilt Repeat these steps 5-10 times: 1. Lie on your back on a firm bed or the floor with your legs extended. 2. Bend your knees so they are pointing toward the ceiling and  your feet are flat on the floor. 3. Tighten your lower abdominal muscles to press your lower back against the floor. This motion will tilt your pelvis so your tailbone points up toward the ceiling instead of pointing to your feet or the floor. 4. With gentle tension and even breathing, hold this position for 5-10 seconds. Cat-cow Repeat these steps until your lower back becomes more flexible: 1. Get into a hands-and-knees position on a firm surface. Keep your hands under your shoulders, and keep your knees under your hips. You may place padding under your knees for comfort. 2. Let your head hang down toward your chest. Contract your abdominal muscles and point your tailbone toward the floor so your lower back becomes rounded like the back of a cat. 3. Hold this position for 5 seconds. 4. Slowly lift your head, let your abdominal muscles relax and point your tailbone up toward the ceiling so your back forms a sagging arch like the back of a cow. 5. Hold this position for 5 seconds.  Press-ups Repeat these steps 5-10 times: 1. Lie on your abdomen (face-down) on the floor. 2. Place your palms near your head, about shoulder-width apart. 3. Keeping your back as relaxed as possible and keeping your hips on the floor, slowly straighten your arms to raise the top half of your body and lift your shoulders. Do not use your back muscles to raise your upper torso. You may adjust the placement of your hands to make yourself more comfortable. 4. Hold this position for 5 seconds while you keep your back relaxed. 5. Slowly return to lying flat on the floor.  Bridges Repeat these steps 10 times: 1. Lie on your back on a firm surface. 2. Bend your knees so they are pointing toward the ceiling and your feet are flat on the floor. Your arms should be flat at your sides, next to your body. 3. Tighten your buttocks muscles and lift your buttocks off the floor until your waist is at almost the same height as your  knees. You should feel the muscles working in your buttocks and the back of your thighs. If you do not feel these muscles, slide your feet 1-2 inches farther away from your buttocks. 4. Hold this position for 3-5 seconds. 5. Slowly lower your hips to the starting position, and allow your buttocks muscles to relax completely. If this exercise is too easy, try doing it with your arms crossed over your chest.  Abdominal crunches Repeat these steps 5-10 times: 1. Lie on your back on a firm bed or the floor with your legs extended. 2. Bend your knees so they are pointing toward the ceiling and your feet are flat on the floor. 3. Cross your arms over your chest. 4. Tip your chin slightly toward your chest without bending your neck. 5. Tighten your abdominal muscles and slowly raise your trunk (torso) high enough to lift your shoulder blades a tiny bit off the floor. Avoid raising your torso higher than that because it can put too much stress on your low back and does not help to strengthen your abdominal muscles. 6. Slowly return to your starting position. Back lifts Repeat these steps 5-10 times: 1. Lie on your abdomen (face-down) with your arms at your sides, and rest your forehead on the floor. 2. Tighten the muscles in your legs and your buttocks. 3. Slowly lift your chest off the floor while you keep your hips pressed to the floor. Keep the back of your head in line with the curve in your back. Your eyes should be looking at the floor. 4. Hold this position for 3-5 seconds. 5. Slowly return to your starting position. Contact a health care provider if:  Your back pain or discomfort gets much worse when you do an exercise.  Your worsening back pain or discomfort does not lessen within 2 hours after you exercise. If you have any of these problems, stop doing these exercises right away. Do not do them again unless your health care provider says that you can. Get help right away if:  You develop  sudden, severe back pain. If this happens, stop doing the exercises right away. Do not do them again unless your health care provider says that you can. This information is not intended to replace advice given to you by your health care provider. Make sure you discuss any questions you have with your health care provider. Document Revised: 07/11/2018 Document Reviewed: 12/06/2017 Elsevier Patient Education  [place].

## 2019-05-28 ENCOUNTER — Telehealth: Payer: 59 | Admitting: Internal Medicine

## 2019-05-30 ENCOUNTER — Telehealth: Payer: Self-pay

## 2019-06-02 ENCOUNTER — Encounter: Payer: Self-pay | Admitting: Internal Medicine

## 2019-06-02 ENCOUNTER — Telehealth (INDEPENDENT_AMBULATORY_CARE_PROVIDER_SITE_OTHER): Payer: 59 | Admitting: Internal Medicine

## 2019-06-02 VITALS — Ht 62.0 in | Wt 160.0 lb

## 2019-06-02 DIAGNOSIS — R001 Bradycardia, unspecified: Secondary | ICD-10-CM | POA: Diagnosis not present

## 2019-06-02 DIAGNOSIS — R002 Palpitations: Secondary | ICD-10-CM | POA: Diagnosis not present

## 2019-06-02 DIAGNOSIS — G459 Transient cerebral ischemic attack, unspecified: Secondary | ICD-10-CM

## 2019-06-02 NOTE — Progress Notes (Signed)
Electrophysiology TeleHealth Note   Due to national recommendations of social distancing due to [place] 19, Audio/video telehealth visit is felt to be most appropriate for this patient at this time.  See MyChart message from today for patient consent regarding telehealth for [facility].   Date:  06/02/2019   ID:  [name], DOB 1965/12/31, MRN [id number]  Location: home  Provider location: [place] Evaluation Performed: New patient consult  PCP:  Doree Albee, MD  Cardiologist:  Kate Sable, MD  Electrophysiologist:  None   Chief Complaint:  bradycardia  History of Present Illness:    [name] is a 54 y.o. female who presents via audio/video conferencing for a telehealth visit today.   The patient is referred for new consultation regarding bradycardia by Dr Jeralyn Ruths as a second opinion.  Dr Court Joy recent notes are reviewed. She has chronic sinus bradycardia with heart rates in the 50s.  She reports initially being asymptomatic.  She had a TIA previously and has noticed symptoms of "palpitations" since then. She has "light fluttering" of her chest lasting typically several seconds.  She is active.  Workup for her TIA previously revealed normal echo.  Her monitor revealed nocturnal bradycardia.  She had an ETT which was normal.  This showed a normal heart rate response to activity.  Today, she denies symptoms of chest pain, shortness of breath, orthopnea, PND, lower extremity edema,  dizziness, presyncope, syncope, bleeding, or neurologic sequela.  She is recovering from covid 19.  She had primarily URI symptoms which have improved.  The patient is tolerating medications without difficulties and is otherwise without complaint today.     Past Medical History:  Diagnosis Date  . DDD (degenerative disc disease), lumbar 01/19/2016  . History of TIA (transient ischemic attack) 01/19/2016  . Hypothyroidism   . Osteoarthritis of both feet 01/19/2016  .  Osteoarthritis of both knees 01/19/2016  . PONV (postoperative nausea and vomiting)   . Stroke [facility]) 07/2010   "TIA" per patient  . Vitamin D deficiency disease 11/28/2018    Past Surgical History:  Procedure Laterality Date  . ABDOMINAL HYSTERECTOMY    . BIOPSY  05/15/2018   Procedure: BIOPSY;  Surgeon: Daneil Dolin, MD;  [facility];  Service: Endoscopy;;  polyp cb  . CHOLECYSTECTOMY    . COLONOSCOPY N/A 05/15/2018   Procedure: COLONOSCOPY;  Surgeon: Daneil Dolin, MD;  Location: [facility];  Service: Endoscopy;  Laterality: N/A;  12:00pm  . EYE SURGERY    . KNEE ARTHROSCOPY     x2  . LAPAROSCOPIC ASSISTED VAGINAL HYSTERECTOMY  10/11/2011   Procedure: LAPAROSCOPIC ASSISTED VAGINAL HYSTERECTOMY;  Surgeon: Margarette Asal, MD;  Location: [facility];  Service: Gynecology;  Laterality: N/A;  . LASIK    . POLYPECTOMY  05/15/2018   Procedure: POLYPECTOMY;  Surgeon: Daneil Dolin, MD;  Location: [facility];  Service: Endoscopy;;  colon     Current Outpatient Medications  Medication Sig Dispense Refill  . aspirin 325 MG EC tablet Take 325 mg by mouth daily.    . baclofen (LIORESAL) 10 MG tablet TAKE 1 TABLET(10 MG) BY MOUTH DAILY AS NEEDED FOR MUSCLE SPASMS 30 tablet 0  . Cholecalciferol (VITAMIN D-3) 125 MCG (5000 UT) TABS Take 10,000 Units by mouth daily at 12 noon.    Marland Kitchen estradiol (ESTRACE) 0.5 MG tablet Take 1 tablet (0.5 mg total) by mouth daily. 30 tablet 3  . Misc Natural  Products (TART CHERRY ADVANCED PO) Take 1 capsule by mouth daily.     . pravastatin (PRAVACHOL) 10 MG tablet TAKE 1 TABLET BY MOUTH EVERY EVENING 90 tablet 0  . progesterone (PROMETRIUM) 200 MG capsule Take 1 capsule (200 mg total) by mouth daily. 30 capsule 3  . TURMERIC PO Take 1 capsule by mouth daily.    . NP THYROID 120 MG tablet TAKE 1 TABLET BY MOUTH DAILY 30 tablet 3   No current facility-administered medications for this visit.    Allergies:   Celebrex [celecoxib]   Social  History:  The patient  reports that she has never smoked. She has never used smokeless tobacco. She reports current alcohol use. She reports that she does not use drugs.   Family History:  The patient's family history includes Anuerysm in her mother; Colon cancer in her paternal uncle; Diabetes in her father and sister; Hypertension in her father; Osteoarthritis in her sister.    ROS:  Please see the history of present illness.   All other systems are personally reviewed and negative.    Exam:    Vital Signs:  Ht 5\' 2"  (1.575 m)   Wt 160 lb (72.6 kg)   LMP 01/09/2011   BMI 29.26 kg/m    Well appearing, alert and conversant, regular work of breathing,  good skin color Eyes- anicteric, neuro- grossly intact, skin- no apparent rash or lesions or cyanosis, mouth- oral mucosa is pink   Labs/Other Tests and Data Reviewed:    Recent Labs: 11/28/2018: Hemoglobin 13.2; Platelets 280 04/22/2019: ALT 10; BUN 10; Creat 0.63; Potassium 4.6; Sodium 142; TSH 0.02   Wt Readings from Last 3 Encounters:  06/02/19 160 lb (72.6 kg)  05/14/19 165 lb 3.2 oz (74.9 kg)  04/22/19 153 lb (69.4 kg)     Other studies personally reviewed: Additional studies/ records that were reviewed today include: prior cardiac monitor is personally reviewed, ekgs are reviewed,  Echos and cardiology notes are reviewed  Review of the above records today demonstrates: as above  ASSESSMENT & PLAN:    1.  Sinus bradycardia Minimally symptomatic Normal response to exercise on treadmill test No indication for pacing No further workup advised  2. Palpitations Rare and short I did discuss the possibility of a loop recorder to further evaluate her palpitations, particularly given prior TIA.  At this time, she is not interested in long term monitoring.  She may reconsider if her palpitations worsen.  Follow-up with Dr Bronson Ing as scheduled I will see as needed going forward  Current medicines are reviewed at length with  the patient today.   The patient does not have concerns regarding her medicines.  The following changes were made today:  none  Labs/ tests ordered today include:  No orders of the defined types were placed in this encounter.   Patient Risk:  after full review of this patients clinical status, I feel that they are at moderate risk at this time.   Today, I have spent 20 minutes with the patient with telehealth technology discussing palpitations and bradycardia .    Signed, Thompson Grayer MD, [place] 06/02/2019 8:45 AM   [facility] [address] [place] [address] [phone] (office) [phone] (fax)

## 2019-06-03 ENCOUNTER — Other Ambulatory Visit: Payer: Self-pay | Admitting: Rheumatology

## 2019-06-03 ENCOUNTER — Other Ambulatory Visit (INDEPENDENT_AMBULATORY_CARE_PROVIDER_SITE_OTHER): Payer: Self-pay | Admitting: Internal Medicine

## 2019-06-03 NOTE — Telephone Encounter (Signed)
Last Visit: 05/14/19 Next visit: 11/12/19  Okay to refill per Dr. Estanislado Pandy

## 2019-07-03 ENCOUNTER — Other Ambulatory Visit: Payer: Self-pay | Admitting: Rheumatology

## 2019-07-03 NOTE — Telephone Encounter (Signed)
Last Visit: 05/14/2019 Next Visit: 11/12/2019  Okay to refill per Dr. Estanislado Pandy.

## 2019-07-24 ENCOUNTER — Encounter (INDEPENDENT_AMBULATORY_CARE_PROVIDER_SITE_OTHER): Payer: Self-pay | Admitting: Internal Medicine

## 2019-07-24 ENCOUNTER — Ambulatory Visit (INDEPENDENT_AMBULATORY_CARE_PROVIDER_SITE_OTHER): Payer: 59 | Admitting: Internal Medicine

## 2019-07-24 ENCOUNTER — Other Ambulatory Visit: Payer: Self-pay

## 2019-07-24 VITALS — BP 120/80 | HR 70 | Temp 98.4°F | Ht 65.0 in | Wt 162.2 lb

## 2019-07-24 DIAGNOSIS — R5383 Other fatigue: Secondary | ICD-10-CM | POA: Diagnosis not present

## 2019-07-24 DIAGNOSIS — N951 Menopausal and female climacteric states: Secondary | ICD-10-CM | POA: Diagnosis not present

## 2019-07-24 DIAGNOSIS — R5381 Other malaise: Secondary | ICD-10-CM | POA: Diagnosis not present

## 2019-07-24 DIAGNOSIS — E559 Vitamin D deficiency, unspecified: Secondary | ICD-10-CM

## 2019-07-24 NOTE — Progress Notes (Signed)
Metrics: Intervention Frequency ACO  Documented Smoking Status Yearly  Screened one or more times in 24 months  Cessation Counseling or  Active cessation medication Past 24 months  Past 24 months   Guideline developer: UpToDate (See UpToDate for funding source) Date Released: 2014       Wellness Office Visit  Subjective:  Patient ID: [name], female    DOB: 03-07-1966  Age: 54 y.o. MRN: [id number]  CC: This lady comes in for follow-up of her postmenopausal symptoms of hot flashes, insomnia. HPI  Since increasing the progesterone dose, she says she feels better with less fluctuation in mood and she sleeps better.  Unfortunately, her estranged mother died less than a month ago and she is trying to deal with that emotionally. She has continued with desiccated NP thyroid despite palpitations but she notices that the palpitations tend to be worse when she is stressed and not at other times.  Her T3 levels were in a good range at the last visit. Past Medical History:  Diagnosis Date  . DDD (degenerative disc disease), lumbar 01/19/2016  . History of TIA (transient ischemic attack) 01/19/2016  . Hypothyroidism   . Osteoarthritis of both feet 01/19/2016  . Osteoarthritis of both knees 01/19/2016  . PONV (postoperative nausea and vomiting)   . Stroke [facility]) 07/2010   "TIA" per patient  . Vitamin D deficiency disease 11/28/2018      Family History  Problem Relation Age of Onset  . Anuerysm Mother   . Diabetes Father   . Hypertension Father   . Diabetes Sister   . Osteoarthritis Sister   . Colon cancer Paternal Uncle     Social History   Social History Narrative   Lives with husband and son/family.Works in [employer] for a long time. Has 27 son 46 years old. Divorced x 1, remarried <1. Has 3 dogs.   Social History   Tobacco Use  . Smoking status: Never Smoker  . Smokeless tobacco: Never Used  Substance Use Topics  . Alcohol use: Yes    Comment: occasionally     Current Meds  Medication Sig  . aspirin 325 MG EC tablet Take 325 mg by mouth daily.  . baclofen (LIORESAL) 10 MG tablet TAKE 1 TABLET(10 MG) BY MOUTH DAILY AS NEEDED FOR MUSCLE SPASMS  . Cholecalciferol (VITAMIN D-3) 125 MCG (5000 UT) TABS Take 10,000 Units by mouth daily at 12 noon.  Marland Kitchen estradiol (ESTRACE) 0.5 MG tablet TAKE 1 TABLET(0.5 MG) BY MOUTH DAILY  . Misc Natural Products (TART CHERRY ADVANCED PO) Take 1 capsule by mouth daily.   . NP THYROID 120 MG tablet TAKE 1 TABLET BY MOUTH DAILY  . pravastatin (PRAVACHOL) 10 MG tablet TAKE 1 TABLET BY MOUTH EVERY EVENING  . progesterone (PROMETRIUM) 200 MG capsule Take 1 capsule (200 mg total) by mouth daily.  . TURMERIC PO Take 1 capsule by mouth daily.       Objective:   Today's Vitals: BP 120/80 (BP Location: Right Arm, Patient Position: Sitting, Cuff Size: Normal)   Pulse 70   Temp 98.4 F (36.9 C) (Temporal)   Ht 5\' 5"  (1.651 m)   Wt 162 lb 3.2 oz (73.6 kg)   LMP 01/09/2011   SpO2 96%   BMI 26.99 kg/m  Vitals with BMI 07/24/2019 06/02/2019 05/14/2019  Height 5\' 5"  5\' 2"  5\' 2"   Weight 162 lbs 3 oz 160 lbs 165 lbs 3 oz  BMI 26.99 123XX123 [id number]  Systolic 123456 - 0000000  Diastolic 80 - 77  Pulse 70 - 63     Physical Exam  She looks systemically well.  She has gained a couple of pounds.  Blood pressure is excellent.  She is somewhat emotional today with the death of her mother recently.     Assessment   1. Hot flashes due to menopause   2. Vitamin D deficiency disease   3. Malaise and fatigue       Tests ordered Orders Placed This Encounter  Procedures  . Progesterone  . Estradiol     Plan: 1. I will check her estradiol and progesterone levels and we may need to further increase the progesterone depending on the level. 2. Follow-up soon in about 4 to 6 weeks time to see how she is doing.   No orders of the defined types were placed in this encounter.   Doree Albee, MD

## 2019-07-25 LAB — PROGESTERONE: Progesterone: 11.3 ng/mL

## 2019-07-25 LAB — ESTRADIOL: Estradiol: 409 pg/mL — ABNORMAL HIGH

## 2019-08-02 ENCOUNTER — Other Ambulatory Visit: Payer: Self-pay | Admitting: Rheumatology

## 2019-08-02 ENCOUNTER — Other Ambulatory Visit (INDEPENDENT_AMBULATORY_CARE_PROVIDER_SITE_OTHER): Payer: Self-pay | Admitting: Internal Medicine

## 2019-08-04 NOTE — Telephone Encounter (Signed)
Last Visit: 05/14/2019 Next Visit: 11/12/2019  Okay to refill per Dr. Estanislado Pandy

## 2019-08-24 ENCOUNTER — Other Ambulatory Visit (INDEPENDENT_AMBULATORY_CARE_PROVIDER_SITE_OTHER): Payer: Self-pay | Admitting: Internal Medicine

## 2019-08-27 ENCOUNTER — Encounter (INDEPENDENT_AMBULATORY_CARE_PROVIDER_SITE_OTHER): Payer: Self-pay | Admitting: Internal Medicine

## 2019-08-27 ENCOUNTER — Telehealth (INDEPENDENT_AMBULATORY_CARE_PROVIDER_SITE_OTHER): Payer: 59 | Admitting: Internal Medicine

## 2019-08-27 DIAGNOSIS — J0100 Acute maxillary sinusitis, unspecified: Secondary | ICD-10-CM

## 2019-08-27 MED ORDER — AMOXICILLIN-POT CLAVULANATE 875-125 MG PO TABS
1.0000 | ORAL_TABLET | Freq: Two times a day (BID) | ORAL | 0 refills | Status: DC
Start: 2019-08-27 — End: 2019-09-24

## 2019-08-27 MED ORDER — PREDNISONE 20 MG PO TABS: 40.0000 mg | ORAL_TABLET | Freq: Every day | ORAL | 1 refills | Status: DC

## 2019-08-27 NOTE — Progress Notes (Signed)
Metrics: Intervention Frequency ACO  Documented Smoking Status Yearly  Screened one or more times in 24 months  Cessation Counseling or  Active cessation medication Past 24 months  Past 24 months   Guideline developer: UpToDate (See UpToDate for funding source) Date Released: 2014       Wellness Office Visit  Subjective:  Patient ID: [name], female    DOB: 04-09-1965  Age: 54 y.o. MRN: [id number]  CC: This is an audiovisual telemedicine visit with the patient who is at home and I am in my office.  I was able to identify with 2 separate identifiers. Her chief complaint is nasal and facial congestion and pressure. HPI  She has had the above symptoms for the last 2 weeks.She was trying to treat herself with over-the-counter Flonase.  She has postnasal drainage, production of greenish nasal rhinorrhea and pressure in her cheekbones and face.  She did have a fever a few days ago but not today.  She denies any dyspnea.  She denies any body aches. Past Medical History:  Diagnosis Date  . DDD (degenerative disc disease), lumbar 01/19/2016  . History of TIA (transient ischemic attack) 01/19/2016  . Hypothyroidism   . Osteoarthritis of both feet 01/19/2016  . Osteoarthritis of both knees 01/19/2016  . PONV (postoperative nausea and vomiting)   . Stroke [facility]) 07/2010   "TIA" per patient  . Vitamin D deficiency disease 11/28/2018   Past Surgical History:  Procedure Laterality Date  . ABDOMINAL HYSTERECTOMY    . BIOPSY  05/15/2018   Procedure: BIOPSY;  Surgeon: Daneil Dolin, MD;  [facility];  Service: Endoscopy;;  polyp cb  . CHOLECYSTECTOMY    . COLONOSCOPY N/A 05/15/2018   Procedure: COLONOSCOPY;  Surgeon: Daneil Dolin, MD;  [facility];  Service: Endoscopy;  Laterality: N/A;  12:00pm  . EYE SURGERY    . KNEE ARTHROSCOPY     x2  . LAPAROSCOPIC ASSISTED VAGINAL HYSTERECTOMY  10/11/2011   Procedure: LAPAROSCOPIC ASSISTED VAGINAL HYSTERECTOMY;  Surgeon:  Margarette Asal, MD;  Location: [facility];  Service: Gynecology;  Laterality: N/A;  . LASIK    . POLYPECTOMY  05/15/2018   Procedure: POLYPECTOMY;  Surgeon: Daneil Dolin, MD;  [facility];  Service: Endoscopy;;  colon      Family History  Problem Relation Age of Onset  . Anuerysm Mother   . Diabetes Father   . Hypertension Father   . Diabetes Sister   . Osteoarthritis Sister   . 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 Divorced x 1, remarried <1. Has 3 dogs.   Social History   Tobacco Use  . Smoking status: Never Smoker  . Smokeless tobacco: Never Used  Substance Use Topics  . Alcohol use: Yes    Comment: occasionally    Current Meds  Medication Sig  . amoxicillin-clavulanate (AUGMENTIN) 875-125 MG tablet Take 1 tablet by mouth 2 (two) times daily.  Marland Kitchen aspirin 325 MG EC tablet Take 325 mg by mouth daily.  . baclofen (LIORESAL) 10 MG tablet TAKE 1 TABLET(10 MG) BY MOUTH DAILY AS NEEDED FOR MUSCLE SPASMS  . Cholecalciferol (VITAMIN D-3) 125 MCG (5000 UT) TABS Take 10,000 Units by mouth daily at 12 noon.  Marland Kitchen estradiol (ESTRACE) 0.5 MG tablet TAKE 1 TABLET(0.5 MG) BY MOUTH  DAILY  . Misc Natural Products (TART CHERRY ADVANCED PO) Take 1 capsule by mouth daily.   . pravastatin (PRAVACHOL) 10 MG tablet TAKE 1 TABLET BY MOUTH EVERY EVENING  . predniSONE (DELTASONE) 20 MG tablet Take 2 tablets (40 mg total) by mouth daily with breakfast.  . progesterone (PROMETRIUM) 100 MG capsule TAKE 1 CAPSULE(100 MG) BY MOUTH DAILY  . progesterone (PROMETRIUM) 200 MG capsule TAKE 1 CAPSULE(200 MG) BY MOUTH DAILY  . TURMERIC PO Take 1 capsule by mouth daily.      No flowsheet data found.   Objective:   Today's Vitals: LMP 01/09/2011  Vitals with BMI 08/27/2019 07/24/2019 06/02/2019  Height (No Data) 5\' 5"  5\' 2"   Weight (No Data) 162 lbs 3 oz  160 lbs  BMI - 24.81 85.90  Systolic (No Data) 931 -  Diastolic (No Data) 80 -  Pulse - 70 -     Physical Exam   Her speech appears to be normal on the phone.  She appears to be alert and orientated.  She had tenderness on pressure against her maxillary areas.    Assessment   1. Acute non-recurrent maxillary sinusitis       Tests ordered No orders of the defined types were placed in this encounter.    Plan: 1. I have sent a prescription for Augmentin of the antibiotic of choice and also prednisone and steroids for the allergic response.  I explained possible side effects from steroids and she has had steroids before.  If she does not improve, she will let me know. 2. This phone call lasted 5 minutes and 34 seconds.   Meds ordered this encounter  Medications  . predniSONE (DELTASONE) 20 MG tablet    Sig: Take 2 tablets (40 mg total) by mouth daily with breakfast.    Dispense:  10 tablet    Refill:  1  . amoxicillin-clavulanate (AUGMENTIN) 875-125 MG tablet    Sig: Take 1 tablet by mouth 2 (two) times daily.    Dispense:  20 tablet    Refill:  0    [place] Luther Parody, MD

## 2019-09-01 ENCOUNTER — Other Ambulatory Visit (INDEPENDENT_AMBULATORY_CARE_PROVIDER_SITE_OTHER): Payer: Self-pay | Admitting: Internal Medicine

## 2019-09-01 ENCOUNTER — Other Ambulatory Visit: Payer: Self-pay | Admitting: Rheumatology

## 2019-09-01 NOTE — Telephone Encounter (Signed)
Last Visit:05/14/2019 Next Visit:11/12/2019  Last Fill: 08/04/2019  Okay to refillBaclofen?

## 2019-09-03 ENCOUNTER — Ambulatory Visit (INDEPENDENT_AMBULATORY_CARE_PROVIDER_SITE_OTHER): Payer: 59 | Admitting: Internal Medicine

## 2019-09-03 ENCOUNTER — Other Ambulatory Visit: Payer: Self-pay

## 2019-09-03 ENCOUNTER — Encounter (INDEPENDENT_AMBULATORY_CARE_PROVIDER_SITE_OTHER): Payer: Self-pay | Admitting: Internal Medicine

## 2019-09-03 ENCOUNTER — Other Ambulatory Visit (INDEPENDENT_AMBULATORY_CARE_PROVIDER_SITE_OTHER): Payer: Self-pay | Admitting: Internal Medicine

## 2019-09-03 VITALS — BP 120/70 | HR 66 | Temp 97.6°F | Resp 18 | Ht 65.0 in | Wt 161.8 lb

## 2019-09-03 DIAGNOSIS — R5381 Other malaise: Secondary | ICD-10-CM | POA: Diagnosis not present

## 2019-09-03 DIAGNOSIS — E559 Vitamin D deficiency, unspecified: Secondary | ICD-10-CM | POA: Diagnosis not present

## 2019-09-03 DIAGNOSIS — N951 Menopausal and female climacteric states: Secondary | ICD-10-CM | POA: Diagnosis not present

## 2019-09-03 DIAGNOSIS — F520 Hypoactive sexual desire disorder: Secondary | ICD-10-CM

## 2019-09-03 DIAGNOSIS — R5383 Other fatigue: Secondary | ICD-10-CM

## 2019-09-03 MED ORDER — FIRST-TESTOSTERONE MC 2 % TD CREA: 5.0000 mg | TOPICAL_CREAM | Freq: Every day | TRANSDERMAL | 0 refills | Status: DC

## 2019-09-03 MED ORDER — PROGESTERONE MICRONIZED 100 MG PO CAPS: ORAL_CAPSULE | ORAL | 3 refills | Status: DC

## 2019-09-03 NOTE — Progress Notes (Signed)
New problem.

## 2019-09-03 NOTE — Patient Instructions (Signed)
[facility] Dietary Recommendations for Weight Loss What to Avoid . Avoid added sugars o Often added sugar can be found in processed foods such as many condiments, dry cereals, cakes, cookies, chips, crisps, crackers, candies, sweetened drinks, etc.  o Read labels and AVOID/DECREASE use of foods with the following in their ingredient list: Sugar, fructose, high fructose corn syrup, sucrose, glucose, maltose, dextrose, molasses, cane sugar, brown sugar, any type of syrup, agave nectar, etc.   . Avoid snacking in between meals . Avoid foods made with flour o If you are going to eat food made with flour, choose those made with whole-grains; and, minimize your consumption as much as is tolerable . Avoid processed foods o These foods are generally stocked in the middle of the grocery store. Focus on shopping on the perimeter of the grocery.  . Avoid Meat  o We recommend following a plant-based diet at [facility]. Thus, we recommend avoiding meat as a general rule. Consider eating beans, legumes, eggs, and/or dairy products for regular protein sources o If you plan on eating meat limit to 4 ounces of meat at a time and choose lean options such as Fish, chicken, turkey. Avoid red meat intake such as pork and/or steak What to Include . Vegetables o GREEN LEAFY VEGETABLES: Kale, spinach, mustard greens, collard greens, cabbage, broccoli, etc. o OTHER: Asparagus, cauliflower, eggplant, carrots, peas, Brussel sprouts, tomatoes, bell peppers, zucchini, beets, cucumbers, etc. . Grains, seeds, and legumes o Beans: kidney beans, black eyed peas, garbanzo beans, black beans, pinto beans, etc. o Whole, unrefined grains: brown rice, barley, bulgur, oatmeal, etc. . Healthy fats  o Avoid highly processed fats such as vegetable oil o Examples of healthy fats: avocado, olives, virgin olive oil, dark chocolate (?72% Cocoa), nuts (peanuts, almonds, walnuts, cashews, pecans, etc.) . None to Low  Intake of Animal Sources of Protein o Meat sources: chicken, turkey, salmon, tuna. Limit to 4 ounces of meat at one time. o Consider limiting dairy sources, but when choosing dairy focus on: PLAIN Greek yogurt, cottage cheese, high-protein milk . Fruit o Choose berries  When to Eat . Intermittent Fasting: o Choosing not to eat for a specific time period, but DO FOCUS ON HYDRATION when fasting o Multiple Techniques: - Time Restricted Eating: eat 3 meals in a day, each meal lasting no more than 60 minutes, no snacks between meals - 16-18 hour fast: fast for 16 to 18 hours up to 7 days a week. Often suggested to start with 2-3 nonconsecutive days per week.  . Remember the time you sleep is counted as fasting.  . Examples of eating schedule: Fast from 7:00pm-11:00am. Eat between 11:00am-7:00pm.  - 24-hour fast: fast for 24 hours up to every other day. Often suggested to start with 1 day per week . Remember the time you sleep is counted as fasting . Examples of eating schedule:  o Eating day: eat 2-3 meals on your eating day. If doing 2 meals, each meal should last no more than 90 minutes. If doing 3 meals, each meal should last no more than 60 minutes. Finish last meal by 7:00pm. o Fasting day: Fast until 7:00pm.  o IF YOU FEEL UNWELL FOR ANY REASON/IN ANY WAY WHEN FASTING, STOP FASTING BY EATING A NUTRITIOUS SNACK OR LIGHT MEAL o ALWAYS FOCUS ON HYDRATION DURING FASTS - Acceptable Hydration sources: water, broths, tea/coffee (black tea/coffee is best but using a small amount of whole-fat dairy products in coffee/tea is acceptable).  -   Poor Hydration Sources: anything with sugar or artificial sweeteners added to it  These recommendations have been developed for patients that are actively receiving medical care from either Dr. Rozalynn Buege or Sarah Gray, DNP, NP-C at [facility]. These recommendations are developed for patients with specific medical conditions and are not meant to be  distributed or used by others that are not actively receiving care from either provider listed above at [facility]. It is not appropriate to participate in the above eating plans without proper medical supervision.   Reference: Fung, J. The obesity code. [place]: [place]; 2016.   

## 2019-09-03 NOTE — Progress Notes (Signed)
Metrics: Intervention Frequency ACO  Documented Smoking Status Yearly  Screened one or more times in 24 months  Cessation Counseling or  Active cessation medication Past 24 months  Past 24 months   Guideline developer: UpToDate (See UpToDate for funding source) Date Released: 2014       Wellness Office Visit  Subjective:  Patient ID: [name], female    DOB: 05/26/1965  Age: 54 y.o. MRN: [id number]  CC: This lady comes in for for follow-up of menopausal symptoms including hot flashes, fatigue and also vitamin D deficiency. HPI  On closer questioning, she also describes decreased sex drive in the last 6 months or so. She continues to take estradiol and progesterone and her levels were in a good range last time they were checked.  She recently had sinus infection which seems to be improving now. She is trying to get back on try to lose weight with nutrition and she is trying to get back to intermittent fasting and eating healthier. Past Medical History:  Diagnosis Date  . DDD (degenerative disc disease), lumbar 01/19/2016  . History of TIA (transient ischemic attack) 01/19/2016  . Hypothyroidism   . Osteoarthritis of both feet 01/19/2016  . Osteoarthritis of both knees 01/19/2016  . PONV (postoperative nausea and vomiting)   . Stroke [facility]) 07/2010   "TIA" per patient  . Vitamin D deficiency disease 11/28/2018   Past Surgical History:  Procedure Laterality Date  . ABDOMINAL HYSTERECTOMY    . BIOPSY  05/15/2018   Procedure: BIOPSY;  Surgeon: Daneil Dolin, MD;  [facility];  Service: Endoscopy;;  polyp cb  . CHOLECYSTECTOMY    . COLONOSCOPY N/A 05/15/2018   Procedure: COLONOSCOPY;  Surgeon: Daneil Dolin, MD;  [facility];  Service: Endoscopy;  Laterality: N/A;  12:00pm  . EYE SURGERY    . KNEE ARTHROSCOPY     x2  . LAPAROSCOPIC ASSISTED VAGINAL HYSTERECTOMY  10/11/2011   Procedure: LAPAROSCOPIC ASSISTED VAGINAL HYSTERECTOMY;  Surgeon: Margarette Asal, MD;  Location: [facility];  Service: Gynecology;  Laterality: N/A;  . LASIK    . POLYPECTOMY  05/15/2018   Procedure: POLYPECTOMY;  Surgeon: Daneil Dolin, MD;  [facility];  Service: Endoscopy;;  colon      Family History  Problem Relation Age of Onset  . Anuerysm Mother   . Diabetes Father   . Hypertension Father   . Diabetes Sister   . Osteoarthritis Sister   . 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 Divorced x 1(previously married for 25 yrs), remarried <1. Has 3 dogs.   Social History   Tobacco Use  . Smoking status: Never Smoker  . Smokeless tobacco: Never Used  Substance Use Topics  . Alcohol use: Yes    Comment: occasionally    Current Meds  Medication Sig  . amoxicillin-clavulanate (AUGMENTIN) 875-125 MG tablet Take 1 tablet by mouth 2 (two) times daily.  Marland Kitchen aspirin 325 MG EC tablet Take 325 mg by mouth daily.  . baclofen (LIORESAL) 10 MG tablet TAKE 1 TABLET(10 MG) BY MOUTH DAILY AS NEEDED FOR MUSCLE SPASMS  . Cholecalciferol (VITAMIN D-3) 125 MCG (5000 UT) TABS Take 10,000 Units by mouth daily at 12 noon.  Marland Kitchen estradiol (ESTRACE) 0.5 MG tablet TAKE 1 TABLET(0.5 MG)  BY MOUTH DAILY  . Misc Natural Products (TART CHERRY ADVANCED PO) Take 1 capsule by mouth daily.   . NP THYROID 120 MG tablet TAKE 1 TABLET BY MOUTH DAILY  . pravastatin (PRAVACHOL) 10 MG tablet TAKE 1 TABLET BY MOUTH EVERY EVENING  . progesterone (PROMETRIUM) 100 MG capsule TAKE 1 CAPSULE(100 MG) BY MOUTH DAILY  . TURMERIC PO Take 1 capsule by mouth daily.  . [DISCONTINUED] predniSONE (DELTASONE) 20 MG tablet Take 2 tablets (40 mg total) by mouth daily with breakfast.       Depression screen [facility] 2/9 09/03/2019  Decreased Interest 1  Down, Depressed, Hopeless 3  PHQ - 2 Score 4  Altered sleeping 0  Tired, decreased energy  0  Change in appetite 0  Feeling bad or failure about yourself  1  Trouble concentrating 1  Moving slowly or fidgety/restless 0  Suicidal thoughts 0  PHQ-9 Score 6     Objective:   Today's Vitals: BP 120/70 (BP Location: Right Arm, Patient Position: Sitting, Cuff Size: Normal)   Pulse 66   Temp 97.6 F (36.4 C) (Temporal)   Resp 18   Ht 5\' 5"  (1.651 m)   Wt 161 lb 12.8 oz (73.4 kg)   LMP 01/09/2011   SpO2 98%   BMI 26.92 kg/m  Vitals with BMI 09/03/2019 08/27/2019 07/24/2019  Height 5\' 5"  (No Data) 5\' 5"   Weight 161 lbs 13 oz (No Data) 162 lbs 3 oz  BMI 46.96 - 29.52  Systolic 841 (No Data) 324  Diastolic 70 (No Data) 80  Pulse 66 - 70     Physical Exam       Assessment   1. Hot flashes due to menopause   2. Malaise and fatigue   3. Vitamin D deficiency disease   4. Hypoactive sexual desire disorder       Tests ordered No orders of the defined types were placed in this encounter.    Plan: 1. She will continue with estradiol and progesterone as before and she is tolerating these well. 2. Today we discussed testosterone therapy in view of her hypoactive sexual desire disorder.  I described the FDA warnings, pros and cons and benefits and side effects and mode of administration.  She would like to try testosterone cream and I have called this to [facility] so that she will be taking testosterone 5 mg apply to the labia every day.  I have discussed side effects and how she should do with them. 3. I will see her in about 4 weeks time to see how she is doing and we will check blood levels then. 4. Today I spent 30 minutes with this patient discussing all her symptoms and including testosterone therapy.   No orders of the defined types were placed in this encounter.   Doree Albee, MD

## 2019-09-24 ENCOUNTER — Encounter: Payer: Self-pay | Admitting: Cardiology

## 2019-09-24 ENCOUNTER — Ambulatory Visit: Payer: 59 | Admitting: Cardiovascular Disease

## 2019-09-24 ENCOUNTER — Ambulatory Visit: Payer: 59 | Admitting: Cardiology

## 2019-09-24 VITALS — BP 104/62 | HR 56 | Ht 62.0 in | Wt 165.0 lb

## 2019-09-24 DIAGNOSIS — R001 Bradycardia, unspecified: Secondary | ICD-10-CM

## 2019-09-24 DIAGNOSIS — R002 Palpitations: Secondary | ICD-10-CM | POA: Diagnosis not present

## 2019-09-24 NOTE — Progress Notes (Signed)
Clinical Summary Ms. Thrall is a 54 y.o.female seen today for follow up of the following medical problems. Last seen by Dr Bronson Ing, this is our first visit together.   1. Bradycardia - eventmonitoring demonstrated sinus rhythm, sinus bradycardia, and sinus tachycardia. Symptoms correlated with sinus bradycardia - Exercise tolerance test was normal with no evidence of chronotropic incompetence - seen by EP, no indciations for pacemaker   2. Chest pain - GXT without signs of ischemia - 09/2018 coronary CTA: Ca score of 0, no CAD   3. TIA x 2 - EP discussed a loop recorder given prior palpitations and TIA, patient was not interested at the time.   4. Palpitations - started after her TIA - less often. Few times a month - fluttering feeling, felt nauseous. - limits caffeine, rare EtOH   SH: Copy. Past Medical History:  Diagnosis Date  . DDD (degenerative disc disease), lumbar 01/19/2016  . History of TIA (transient ischemic attack) 01/19/2016  . Hypothyroidism   . Osteoarthritis of both feet 01/19/2016  . Osteoarthritis of both knees 01/19/2016  . PONV (postoperative nausea and vomiting)   . Stroke [facility]) 07/2010   "TIA" per patient  . Vitamin D deficiency disease 11/28/2018     Allergies  Allergen Reactions  . Celebrex [Celecoxib] Swelling    Facial swelling     Current Outpatient Medications  Medication Sig Dispense Refill  . amoxicillin-clavulanate (AUGMENTIN) 875-125 MG tablet Take 1 tablet by mouth 2 (two) times daily. 20 tablet 0  . aspirin 325 MG EC tablet Take 325 mg by mouth daily.    . baclofen (LIORESAL) 10 MG tablet TAKE 1 TABLET(10 MG) BY MOUTH DAILY AS NEEDED FOR MUSCLE SPASMS 30 tablet 0  . Cholecalciferol (VITAMIN D-3) 125 MCG (5000 UT) TABS Take 10,000 Units by mouth daily at 12 noon.    Marland Kitchen estradiol (ESTRACE) 0.5 MG tablet TAKE 1 TABLET(0.5 MG) BY MOUTH DAILY 30 tablet 3  . Misc Natural Products (TART  CHERRY ADVANCED PO) Take 1 capsule by mouth daily.     . NP THYROID 120 MG tablet TAKE 1 TABLET BY MOUTH DAILY 30 tablet 3  . pravastatin (PRAVACHOL) 10 MG tablet TAKE 1 TABLET BY MOUTH EVERY EVENING 90 tablet 0  . progesterone (PROMETRIUM) 100 MG capsule TAKE 1 CAPSULE(100 MG) BY MOUTH DAILY 30 capsule 3  . Testosterone Propionate (FIRST-TESTOSTERONE MC) 2 % CREA Place 5 mg onto the skin daily. 30 g 0  . TURMERIC PO Take 1 capsule by mouth daily.     No current facility-administered medications for this visit.     Past Surgical History:  Procedure Laterality Date  . ABDOMINAL HYSTERECTOMY    . BIOPSY  05/15/2018   Procedure: BIOPSY;  Surgeon: Daneil Dolin, MD;  [facility];  Service: Endoscopy;;  polyp cb  . CHOLECYSTECTOMY    . COLONOSCOPY N/A 05/15/2018   Procedure: COLONOSCOPY;  Surgeon: Daneil Dolin, MD;  [facility];  Service: Endoscopy;  Laterality: N/A;  12:00pm  . EYE SURGERY    . KNEE ARTHROSCOPY     x2  . LAPAROSCOPIC ASSISTED VAGINAL HYSTERECTOMY  10/11/2011   Procedure: LAPAROSCOPIC ASSISTED VAGINAL HYSTERECTOMY;  Surgeon: Margarette Asal, MD;  Location: [facility];  Service: Gynecology;  Laterality: N/A;  . LASIK    . POLYPECTOMY  05/15/2018   Procedure: POLYPECTOMY;  Surgeon: Daneil Dolin, MD;  [facility];  Service:  Endoscopy;;  colon      Allergies  Allergen Reactions  . Celebrex [Celecoxib] Swelling    Facial swelling      Family History  Problem Relation Age of Onset  . Anuerysm Mother   . Diabetes Father   . Hypertension Father   . Diabetes Sister   . Osteoarthritis Sister   . Colon cancer Paternal Uncle      Social History Ms. Ramos reports that she has never smoked. She has never used smokeless tobacco. Ms. Odriscoll reports current alcohol use.   Review of Systems CONSTITUTIONAL: No weight loss, fever, chills, weakness or fatigue.  HEENT: Eyes: No visual loss, blurred vision, double vision or yellow  sclerae.No hearing loss, sneezing, congestion, runny nose or sore throat.  SKIN: No rash or itching.  CARDIOVASCULAR: per hpi RESPIRATORY: No shortness of breath, cough or sputum.  GASTROINTESTINAL: No anorexia, nausea, vomiting or diarrhea. No abdominal pain or blood.  GENITOURINARY: No burning on urination, no polyuria NEUROLOGICAL: No headache, dizziness, syncope, paralysis, ataxia, numbness or tingling in the extremities. No change in bowel or bladder control.  MUSCULOSKELETAL: No muscle, back pain, joint pain or stiffness.  LYMPHATICS: No enlarged nodes. No history of splenectomy.  PSYCHIATRIC: No history of depression or anxiety.  ENDOCRINOLOGIC: No reports of sweating, cold or heat intolerance. No polyuria or polydipsia.  Marland Kitchen   Physical Examination Today's Vitals   09/24/19 0838  BP: 104/62  Pulse: (!) 56  SpO2: 98%  Weight: 165 lb (74.8 kg)  Height: 5\' 2"  (1.575 m)   Body mass index is 30.18 kg/m.  Gen: resting comfortably, no acute distress HEENT: no scleral icterus, pupils equal round and reactive, no palptable cervical adenopathy,  CV: mild brady, regular, no m/r/g, no jvd Resp: Clear to auscultation bilaterally GI: abdomen is soft, non-tender, non-distended, normal bowel sounds, no hepatosplenomegaly MSK: extremities are warm, no edema.  Skin: warm, no rash Neuro:  no focal deficits Psych: appropriate affect   Diagnostic Studies  09/2018 coronary CTA Other findings:  Normal pulmonary vein drainage into the left atrium.  Normal left atrial appendage without a thrombus.  Normal size of the pulmonary artery.  IMPRESSION: 1. No evidence of CAD, CADRADS = 0.  2. Coronary calcium score of 0. This was 0 percentile for age and sex matched control.  3. Normal coronary origin with right dominance.    11/2017 GXT  Blood pressure demonstrated a normal response to exercise.  There was no ST segment deviation noted during stress.  Sinus bradycardia at  rest with normal heart rate response to exercise  Negative exercise stress test for ischemia.    10/2017 TTE [employer]  LVEF 60-65%, no valve dysfunction     Assessment and Plan  1. Bradycardia - chronic mild sinus bradycardia with normal chronotropic competence - no significant symptoms - continue to monitor  2. Palpitations - ongoing, with her prior TIAs raises questions about possible afib or aflutter - she was not in favor of loop recorder during last EP visit, but is open to repeat 30 day monitor - will obtain 30 day monitor to further evaluate  3. History of TIA - she is on high dose ASA, statin    Arnoldo Lenis, M.D.

## 2019-09-24 NOTE — Patient Instructions (Signed)
Your physician recommends that you schedule a follow-up appointment in: [place]  Your physician recommends that you continue on your current medications as directed. Please refer to the Current Medication list given to you today.  Your physician has recommended that you wear an event monitor FOR 30 DAYS. Event monitors are medical devices that record the heart's electrical activity. Doctors most often [place] these monitors to diagnose arrhythmias. Arrhythmias are problems with the speed or rhythm of the heartbeat. The monitor is a small, portable device. You can wear one while you do your normal daily activities. This is usually used to diagnose what is causing palpitations/syncope (passing out).  Thank you for choosing [facility]!!

## 2019-10-01 ENCOUNTER — Other Ambulatory Visit: Payer: Self-pay | Admitting: Rheumatology

## 2019-10-01 ENCOUNTER — Other Ambulatory Visit (INDEPENDENT_AMBULATORY_CARE_PROVIDER_SITE_OTHER): Payer: Self-pay | Admitting: Internal Medicine

## 2019-10-01 NOTE — Telephone Encounter (Signed)
Last Visit:05/14/2019 Next Visit:11/12/2019  Last Fill: 09/01/2019  Okay to refill Baclofen?

## 2019-10-02 ENCOUNTER — Other Ambulatory Visit: Payer: Self-pay

## 2019-10-02 ENCOUNTER — Other Ambulatory Visit (INDEPENDENT_AMBULATORY_CARE_PROVIDER_SITE_OTHER): Payer: Self-pay | Admitting: Internal Medicine

## 2019-10-02 ENCOUNTER — Other Ambulatory Visit (INDEPENDENT_AMBULATORY_CARE_PROVIDER_SITE_OTHER): Payer: Self-pay

## 2019-10-02 ENCOUNTER — Ambulatory Visit (INDEPENDENT_AMBULATORY_CARE_PROVIDER_SITE_OTHER): Payer: 59 | Admitting: Internal Medicine

## 2019-10-02 ENCOUNTER — Telehealth: Payer: Self-pay | Admitting: *Deleted

## 2019-10-02 ENCOUNTER — Encounter (INDEPENDENT_AMBULATORY_CARE_PROVIDER_SITE_OTHER): Payer: Self-pay | Admitting: Internal Medicine

## 2019-10-02 VITALS — BP 120/80 | HR 55 | Temp 97.7°F | Ht 65.0 in | Wt 162.4 lb

## 2019-10-02 DIAGNOSIS — F520 Hypoactive sexual desire disorder: Secondary | ICD-10-CM

## 2019-10-02 DIAGNOSIS — N951 Menopausal and female climacteric states: Secondary | ICD-10-CM

## 2019-10-02 MED ORDER — THYROID 60 MG PO TABS
120.0000 mg | ORAL_TABLET | Freq: Every day | ORAL | 3 refills | Status: DC
Start: 2019-10-02 — End: 2019-12-15

## 2019-10-02 MED ORDER — PROGESTERONE 200 MG PO CAPS: 200.0000 mg | ORAL_CAPSULE | Freq: Every day | ORAL | 1 refills | Status: DC

## 2019-10-02 NOTE — Progress Notes (Signed)
Metrics: Intervention Frequency ACO  Documented Smoking Status Yearly  Screened one or more times in 24 months  Cessation Counseling or  Active cessation medication Past 24 months  Past 24 months   Guideline developer: UpToDate (See UpToDate for funding source) Date Released: 2014       Wellness Office Visit  Subjective:  Patient ID: [name], female    DOB: 02-01-1966  Age: 54 y.o. MRN: [id number]  CC: This lady comes in for follow-up of menopausal symptoms. HPI  She is on estradiol and progesterone on the last visit I prescribed for testosterone cream for decreased libido.  She tells me that the testosterone cream is given her more energy.  Her sex drive has not appreciably improved. In terms of her weight, she has been doing intermittent fasting on a regular basis and is eating more plant-based diet now and reducing the amount of meat she eats. Past Medical History:  Diagnosis Date  . DDD (degenerative disc disease), lumbar 01/19/2016  . History of TIA (transient ischemic attack) 01/19/2016  . Hypothyroidism   . Osteoarthritis of both feet 01/19/2016  . Osteoarthritis of both knees 01/19/2016  . PONV (postoperative nausea and vomiting)   . Stroke [facility]) 07/2010   "TIA" per patient  . Vitamin D deficiency disease 11/28/2018   Past Surgical History:  Procedure Laterality Date  . ABDOMINAL HYSTERECTOMY    . BIOPSY  05/15/2018   Procedure: BIOPSY;  Surgeon: Daneil Dolin, MD;  [facility];  Service: Endoscopy;;  polyp cb  . CHOLECYSTECTOMY    . COLONOSCOPY N/A 05/15/2018   Procedure: COLONOSCOPY;  Surgeon: Daneil Dolin, MD;  [facility];  Service: Endoscopy;  Laterality: N/A;  12:00pm  . EYE SURGERY    . KNEE ARTHROSCOPY     x2  . LAPAROSCOPIC ASSISTED VAGINAL HYSTERECTOMY  10/11/2011   Procedure: LAPAROSCOPIC ASSISTED VAGINAL HYSTERECTOMY;  Surgeon: Margarette Asal, MD;  Location: [facility];  Service: Gynecology;  Laterality: N/A;  . LASIK    .  POLYPECTOMY  05/15/2018   Procedure: POLYPECTOMY;  Surgeon: Daneil Dolin, MD;  [facility];  Service: Endoscopy;;  colon      Family History  Problem Relation Age of Onset  . Anuerysm Mother   . Heart attack Mother   . Diabetes Father   . Hypertension Father   . Diabetes Sister   . Osteoarthritis Sister   . Colon cancer Paternal Uncle     Social History   Social History Narrative   Lives with husband of 2 years(second)  and son/family.Works in [employer] for a long time. Has 56 son 91 years old. Divorced x 1(previously married for 25 yrs), remarried <1. Has 3 dogs.   Social History   Tobacco Use  . Smoking status: Never Smoker  . Smokeless tobacco: Never Used  Substance Use Topics  . Alcohol use: Yes    Comment: occasionally    Current Meds  Medication Sig  . aspirin 325 MG EC tablet Take 325 mg by mouth daily.  . baclofen (LIORESAL) 10 MG tablet TAKE 1 TABLET(10 MG) BY MOUTH DAILY AS NEEDED FOR MUSCLE SPASMS  . Cholecalciferol (VITAMIN D-3) 125 MCG (5000 UT) TABS Take 10,000 Units by mouth daily at 12 noon.  Marland Kitchen estradiol (ESTRACE) 0.5 MG tablet TAKE 1 TABLET(0.5 MG) BY MOUTH DAILY  . Misc Natural Products (TART CHERRY ADVANCED PO) Take 1 capsule by mouth daily.   . pravastatin (PRAVACHOL) 10 MG  tablet TAKE 1 TABLET BY MOUTH EVERY EVENING  . progesterone (PROMETRIUM) 200 MG capsule Take 1 capsule (200 mg total) by mouth daily.  . Testosterone Propionate (FIRST-TESTOSTERONE MC) 2 % CREA Place 5 mg onto the skin daily.  Marland Kitchen thyroid (NP THYROID) 60 MG tablet Take 2 tablets (120 mg total) by mouth daily before breakfast.  . TURMERIC PO Take 1 capsule by mouth daily.  . [DISCONTINUED] progesterone (PROMETRIUM) 200 MG capsule Take 200 mg by mouth daily.      Depression screen PHQ 2/9 09/03/2019  Decreased Interest 1  Down, Depressed, Hopeless 3  PHQ - 2 Score 4  Altered sleeping 0  Tired, decreased energy 0  Change in appetite 0  Feeling bad or failure  about yourself  1  Trouble concentrating 1  Moving slowly or fidgety/restless 0  Suicidal thoughts 0  PHQ-9 Score 6     Objective:   Today's Vitals: BP 120/80 (BP Location: Left Arm, Patient Position: Sitting, Cuff Size: Normal)   Pulse (!) 55   Temp 97.7 F (36.5 C) (Temporal)   Ht 5\' 5"  (1.651 m)   Wt 162 lb 6.4 oz (73.7 kg)   LMP 01/09/2011   SpO2 98%   BMI 27.02 kg/m  Vitals with BMI 10/02/2019 09/24/2019 09/03/2019  Height 5\' 5"  5\' 2"  5\' 5"   Weight 162 lbs 6 oz 165 lbs 161 lbs 13 oz  BMI 27.02 16.10 96.04  Systolic 540 981 191  Diastolic 80 62 70  Pulse 55 56 66     Physical Exam  She looks systemically well.  She has lost 3 pounds in the last week or so.  Blood pressure is excellent.     Assessment   1. Hot flashes due to menopause   2. Hypoactive sexual desire disorder       Tests ordered Orders Placed This Encounter  Procedures  . Testos,Total,Free and SHBG (Female)     Plan: 1. She will continue with estradiol, progesterone and testosterone.  I will check testosterone levels today to see if we need to further optimize them.  She has not had no side effects taking testosterone cream. 2. Follow-up in 3 months for an annual physical exam.   Meds ordered this encounter  Medications  . progesterone (PROMETRIUM) 200 MG capsule    Sig: Take 1 capsule (200 mg total) by mouth daily.    Dispense:  90 capsule    Refill:  1    Philippe Gang Luther Parody, MD

## 2019-10-02 NOTE — Telephone Encounter (Signed)
Pt started wearing event monitor yesterday and says she doesn't feel like its working well, she did speak with [employer] rep who advised her that monitor was working properly - pt would like to d/c event monitor and would like to proceed with loop recorder as per office notes with Dr Rayann Heman and Dr Harl Bowie - will forward to device

## 2019-10-03 NOTE — Telephone Encounter (Signed)
Ok to cancel monitor, please make Dr Rayann Heman aware she is open to the loop   Zandra Abts MD

## 2019-10-06 ENCOUNTER — Telehealth: Payer: Self-pay | Admitting: Cardiology

## 2019-10-06 LAB — TESTOS,TOTAL,FREE AND SHBG (FEMALE)
Free Testosterone: 6.5 pg/mL — ABNORMAL HIGH (ref 0.1–6.4)
Sex Hormone Binding: 86 nmol/L (ref 17–124)
Testosterone, Total, LC-MS-MS: 107 ng/dL — ABNORMAL HIGH (ref 2–45)

## 2019-10-06 NOTE — Telephone Encounter (Signed)
I will forward to eden traige

## 2019-10-06 NOTE — Telephone Encounter (Signed)
Pt wants to confirm that since Dr. Harl Bowie cancelled the monitor, she can remove it and send it back.

## 2019-10-06 NOTE — Telephone Encounter (Signed)
I had asked in the message to cancel the monitor, Tye Maryland can you check and see if this was done. She favored a loop recorder and I had asked that Dr Bonita Quin office be made aware  Zandra Abts MD

## 2019-10-06 NOTE — Telephone Encounter (Signed)
[name] patient  Will forward to MD

## 2019-10-06 NOTE — Telephone Encounter (Signed)
Yes this was already addressed in previous phone note to d/c monitor since pt wanted to proceed with loop recorder - previous message sent to device - pt has been made aware

## 2019-10-08 ENCOUNTER — Ambulatory Visit: Payer: 59

## 2019-10-08 DIAGNOSIS — R002 Palpitations: Secondary | ICD-10-CM

## 2019-10-29 NOTE — Progress Notes (Signed)
Office Visit Note  Patient: [name]             Date of Birth: [birth year]           MRN: [id number]             PCP: Doree Albee, MD Referring: Doree Albee, MD Visit Date: 11/12/2019 Occupation: '@GUAROCC'$ @  Subjective:  Right knee joint pain   History of Present Illness: [name] is a 54 y.o. female with history of nodular scleritis, osteoarthritis, and DDD.  She denies any scleritis flares.  She denies any eye pain, redness, or photophobia.  She has ongoing pain in the right knee joint.  She has intermittent warmth in the right knee joint.  She has difficulty going up and down steps and she is unable to kneel.  She would like to reapply for visco gel injections. She continues to have chronic lower back pain.  She takes baclofen 10 mg daily for muscle spasms and muscle tension, but she does not feel that baclofen has been as effective as it was in the past.  She takes tylenol arthritis for breakthrough pain relief.   Activities of Daily Living:  Patient reports morning stiffness for  10-15 minutes.   Patient Denies nocturnal pain.  Difficulty dressing/grooming: Denies Difficulty climbing stairs: Denies Difficulty getting out of chair: Denies Difficulty using hands for taps, buttons, cutlery, and/or writing: Denies  Review of Systems  Constitutional: Positive for fatigue.  HENT: Negative for mouth sores, mouth dryness and nose dryness.   Eyes: Negative for itching and dryness.  Respiratory: Negative for shortness of breath and difficulty breathing.   Cardiovascular: Positive for palpitations. Negative for chest pain.  Gastrointestinal: Negative for blood in stool, constipation and diarrhea.  Endocrine: Negative for increased urination.  Genitourinary: Negative for difficulty urinating.  Musculoskeletal: Positive for arthralgias, joint pain, joint swelling, muscle weakness and morning stiffness. Negative for myalgias, muscle tenderness and myalgias.  Skin:  Negative for color change, rash and redness.  Allergic/Immunologic: Negative for susceptible to infections.  Neurological: Positive for numbness and weakness. Negative for dizziness, headaches and memory loss.  Hematological: Positive for bruising/bleeding tendency.  Psychiatric/Behavioral: Negative for confusion.    PMFS History:  Patient Active Problem List   Diagnosis Date Noted  . Palpitation 04/22/2019  . Fatigue 04/22/2019  . Menopause 04/22/2019  . Vitamin D deficiency disease 11/28/2018  . Abdominal pain 03/21/2018  . Nausea with vomiting 03/21/2018  . Primary osteoarthritis of both knees 09/24/2017  . Osteoarthritis of both knees 01/19/2016  . Osteoarthritis of both feet 01/19/2016  . History of TIA (transient ischemic attack) 01/19/2016  . DDD (degenerative disc disease), lumbar 01/19/2016  . Nodular episcleritis of both eyes 01/19/2016  . Pain in right knee 01/19/2016  . Leiomyoma 10/12/2011  . Female pelvic pain 10/12/2011    Past Medical History:  Diagnosis Date  . DDD (degenerative disc disease), lumbar 01/19/2016  . History of TIA (transient ischemic attack) 01/19/2016  . Hypothyroidism   . Osteoarthritis of both feet 01/19/2016  . Osteoarthritis of both knees 01/19/2016  . PONV (postoperative nausea and vomiting)   . Stroke [facility]) 07/2010   "TIA" per patient  . Vitamin D deficiency disease 11/28/2018    Family History  Problem Relation Age of Onset  . Anuerysm Mother   . Heart attack Mother   . Diabetes Father   . Hypertension Father   . Diabetes Sister   . Osteoarthritis Sister   .  Colon cancer Paternal Uncle    Past Surgical History:  Procedure Laterality Date  . ABDOMINAL HYSTERECTOMY    . BIOPSY  05/15/2018   Procedure: BIOPSY;  Surgeon: Daneil Dolin, MD;  [facility];  Service: Endoscopy;;  polyp cb  . CHOLECYSTECTOMY    . COLONOSCOPY N/A 05/15/2018   Procedure: COLONOSCOPY;  Surgeon: Daneil Dolin, MD;  [facility];   Service: Endoscopy;  Laterality: N/A;  12:00pm  . EYE SURGERY    . KNEE ARTHROSCOPY     x2  . LAPAROSCOPIC ASSISTED VAGINAL HYSTERECTOMY  10/11/2011   Procedure: LAPAROSCOPIC ASSISTED VAGINAL HYSTERECTOMY;  Surgeon: Margarette Asal, MD;  Location: [facility];  Service: Gynecology;  Laterality: N/A;  . LASIK    . POLYPECTOMY  05/15/2018   Procedure: POLYPECTOMY;  Surgeon: Daneil Dolin, MD;  [facility];  Service: Endoscopy;;  colon    Social History   Social History Narrative   Lives with husband of 2 years(second)  and son/family.Works in [employer] for a long time. Has 71 son 52 years old. Divorced x 1(previously married for 25 yrs), remarried <1. Has 3 dogs.   Immunization History  Administered Date(s) Administered  . Influenza,inj,Quad PF,6+ Mos 02/27/2019     Objective: Vital Signs: BP 104/69 (BP Location: Left Arm, Patient Position: Sitting, Cuff Size: Normal)   Pulse (!) 56   Resp 14   Ht 5' 2" (1.575 m)   Wt 166 lb (75.3 kg)   LMP 01/09/2011   BMI 30.36 kg/m    Physical Exam Vitals and nursing note reviewed.  Constitutional:      Appearance: She is well-developed.  HENT:     Head: Normocephalic and atraumatic.  Eyes:     Conjunctiva/sclera: Conjunctivae normal.  Pulmonary:     Effort: Pulmonary effort is normal.  Abdominal:     Palpations: Abdomen is soft.  Musculoskeletal:     Cervical back: Normal range of motion.  Skin:    General: Skin is warm and dry.     Capillary Refill: Capillary refill takes less than 2 seconds.  Neurological:     Mental Status: She is alert and oriented to person, place, and time.  Psychiatric:        Behavior: Behavior normal.      Musculoskeletal Exam: C-spine, thoracic spine, and lumbar spine good ROM. Tenderness over the right piriformis.  Shoulder joints, elbow joints, wrist joints, MCPs, PIPs, and DIPs good ROM with no synovitis.  Complete fist formation bilaterally.  Hip joints, knee joints, ankle joints,  MTPs, PIPs, and DIPs good ROM with no synovitis.  No warmth or effusion of knee joints.  No tenderness or swelling of ankle joints.  Hallux rigidus bilaterally.   CDAI Exam: CDAI Score: -- Patient Global: --; Provider Global: -- Swollen: --; Tender: -- Joint Exam 11/12/2019   No joint exam has been documented for this visit   There is currently no information documented on the homunculus. Go to the Rheumatology activity and complete the homunculus joint exam.  Investigation: No additional findings.  Imaging: No results found.  Recent Labs: Lab Results  Component Value Date   WBC 4.7 11/28/2018   HGB 13.2 11/28/2018   PLT 280 11/28/2018   NA 142 04/22/2019   K 4.6 04/22/2019   CL 105 04/22/2019   CO2 27 04/22/2019   GLUCOSE 95 04/22/2019   BUN 10 04/22/2019   CREATININE 0.63 04/22/2019   BILITOT 0.9 04/22/2019  ALKPHOS 50 07/13/2016   AST 15 04/22/2019   ALT 10 04/22/2019   PROT 7.2 04/22/2019   ALBUMIN 3.8 07/13/2016   CALCIUM 9.2 04/22/2019   GFRAA 120 04/22/2019    Speciality Comments: No specialty comments available.  Procedures:  No procedures performed Allergies: Celebrex [celecoxib]   Assessment / Plan:     Visit Diagnoses: Nodular episcleritis of both eyes: HLA-B27 negative: She has not had any recent episcleritis flares.  She has no eye pain, conjunctival injection, or photophobia at this time.  She was advised to notify us if has a recurrence of episcleritis.    Primary osteoarthritis of both knees - She is not having any discomfort in her left knee joint at this time.  She has chronic right knee joint pain.  She has been experiencing increased difficulty climbing steps and is unable to kneel.  She has good ROM of the right knee joint on exam today with no warmth or effusion.  She had a right knee joint cortisone injection on 10/03/2018 which provided significant relief.  She also underwent visco gel injections in February 2020.  She would like to reapply  for visco gel injections for the right knee joint only.  She was given a handout of knee joint exercises to perform.  We discussed the importance of lower extremity muscle strengthening.   Primary osteoarthritis of both feet: She is not having any discomfort in her feet at this time.   DDD (degenerative disc disease), lumbar:  She has good ROM of the lumbar spine with no discomfort.  No midline spinal tenderness.  She is not having any symptoms of radiculopathy.  She continues to take baclofen 10 mg in the morning daily for muscle spasms and muscle tension.  She takes tylenol arthritis for breakthrough pain relief. She was given a handout of back exercises and core strengthening exercises.   Piriformis syndrome, right: She has tenderness to palpation on exam. She was given a handout of exercises to perform.   Chronic right SI joint pain - HLA-B27 negative: She had a right SI joint cortisone on 04/04/2018 which provided significant relief.  She has tenderness to palpation on exam today.   Chronic left SI joint pain: No tenderness on exam today.  Other medical conditions are listed as follows:   History of TIA (transient ischemic attack)  History of cholecystectomy  Orders: No orders of the defined types were placed in this encounter.  No orders of the defined types were placed in this encounter.     Follow-Up Instructions: Return in about 6 months (around 05/14/2020) for Osteoarthritis, DDD.   Ofilia Neas, PA-C  Note - This record has been created using Dragon software.  Chart creation errors have been sought, but may not always  have been located. Such creation errors do not reflect on  the standard of medical care.

## 2019-10-31 ENCOUNTER — Other Ambulatory Visit: Payer: Self-pay | Admitting: Physician Assistant

## 2019-10-31 ENCOUNTER — Other Ambulatory Visit (INDEPENDENT_AMBULATORY_CARE_PROVIDER_SITE_OTHER): Payer: Self-pay | Admitting: Internal Medicine

## 2019-10-31 NOTE — Telephone Encounter (Signed)
Last Visit:05/14/2019 Next Visit:11/12/2019  Last Fill: 10/01/2019  Okay to refill Baclofen?

## 2019-11-06 ENCOUNTER — Ambulatory Visit: Payer: 59 | Admitting: Family Medicine

## 2019-11-12 ENCOUNTER — Encounter: Payer: Self-pay | Admitting: Physician Assistant

## 2019-11-12 ENCOUNTER — Telehealth: Payer: Self-pay

## 2019-11-12 ENCOUNTER — Other Ambulatory Visit: Payer: Self-pay

## 2019-11-12 ENCOUNTER — Ambulatory Visit: Payer: 59 | Admitting: Physician Assistant

## 2019-11-12 VITALS — BP 104/69 | HR 56 | Resp 14 | Ht 62.0 in | Wt 166.0 lb

## 2019-11-12 DIAGNOSIS — M19072 Primary osteoarthritis, left ankle and foot: Secondary | ICD-10-CM

## 2019-11-12 DIAGNOSIS — M170 Bilateral primary osteoarthritis of knee: Secondary | ICD-10-CM

## 2019-11-12 DIAGNOSIS — G8929 Other chronic pain: Secondary | ICD-10-CM

## 2019-11-12 DIAGNOSIS — M5136 Other intervertebral disc degeneration, lumbar region: Secondary | ICD-10-CM

## 2019-11-12 DIAGNOSIS — H15123 Nodular episcleritis, bilateral: Secondary | ICD-10-CM

## 2019-11-12 DIAGNOSIS — M19071 Primary osteoarthritis, right ankle and foot: Secondary | ICD-10-CM

## 2019-11-12 DIAGNOSIS — M533 Sacrococcygeal disorders, not elsewhere classified: Secondary | ICD-10-CM

## 2019-11-12 DIAGNOSIS — Z9049 Acquired absence of other specified parts of digestive tract: Secondary | ICD-10-CM

## 2019-11-12 DIAGNOSIS — G5701 Lesion of sciatic nerve, right lower limb: Secondary | ICD-10-CM

## 2019-11-12 DIAGNOSIS — Z8673 Personal history of transient ischemic attack (TIA), and cerebral infarction without residual deficits: Secondary | ICD-10-CM

## 2019-11-12 NOTE — Patient Instructions (Addendum)
Piriformis Syndrome Rehab Ask your health care provider which exercises are safe for you. Do exercises exactly as told by your health care provider and adjust them as directed. It is normal to feel mild stretching, pulling, tightness, or discomfort as you do these exercises. Stop right away if you feel sudden pain or your pain gets worse. Do not begin these exercises until told by your health care provider. Stretching and range-of-motion exercises These exercises warm up your muscles and joints and improve the movement and flexibility of your hip and pelvis. The exercises also help to relieve pain, numbness, and tingling. Hip rotation This is an exercise in which you lie on your back and stretch the muscles that rotate your hip (hip rotators) to stretch your buttocks. 1. Lie on your back on a firm surface. 2. Pull your left / right knee toward your same shoulder with your left / right hand until your knee is pointing toward the ceiling. Hold your left / right ankle with your other hand. 3. Keeping your knee steady, gently pull your left / right ankle toward your other shoulder until you feel a stretch in your buttocks. 4. Hold this position for __________ seconds. Repeat __________ times. Complete this exercise __________ times a day. Hip extensor This is an exercise in which you lie on your back and pull your knee to your chest. 1. Lie on your back on a firm surface. Both of your legs should be straight. 2. Pull your left / right knee to your chest. Hold your leg in this position by holding onto the back of your thigh or the front of your knee. 3. Hold this position for __________ seconds. 4. Slowly return to the starting position. Repeat __________ times. Complete this exercise __________ times a day. Strengthening exercises These exercises build strength and endurance in your hip and thigh muscles. Endurance is the ability to use your muscles for a long time, even after they get  tired. Straight leg raises, side-lying This exercise strengthens the muscles that rotate the leg at the hip and move it away from your body (hip abductors). 1. Lie on your side with your left / right leg in the top position. Lie so your head, shoulder, knee, and hip line up. Bend your bottom knee to help you balance. 2. Lift your top leg 4-6 inches (10-15 cm) while keeping your toes pointed straight ahead. 3. Hold this position for __________ seconds. 4. Slowly lower your leg to the starting position. 5. Let your muscles relax completely after each repetition. Repeat __________ times. Complete this exercise __________ times a day. Hip abduction and rotation This is sometimes called quadruped (on hands and knees) exercises. 1. Get on your hands and knees on a firm, lightly padded surface. Your hands should be directly below your shoulders, and your knees should be directly below your hips. 2. Lift your left / right knee out to the side. Keep your knee bent. Do not twist your body. 3. Hold this position for __________ seconds. 4. Slowly lower your leg. Repeat __________ times. Complete this exercise __________ times a day. Straight leg raises, face-down This exercise stretches the muscles that move your hips away from the front of the pelvis (hip extensors). 1. Lie on your abdomen on a bed or a firm surface with a pillow under your hips. 2. Squeeze your buttocks muscles and lift your left / right leg about 4-6 inches (10-15 cm) off the bed. Do not let your back arch. 3. Hold  this position for __________ seconds. 4. Slowly lower your leg to the starting position. 5. Let your muscles relax completely after each repetition. Repeat __________ times. Complete this exercise __________ times a day. This information is not intended to replace advice given to you by your health care provider. Make sure you discuss any questions you have with your health care provider. Document Revised: 06/27/2018  Document Reviewed: 12/27/2017 Elsevier Patient Education  [place]. Core Strength Exercises  Core exercises help to build strength in the muscles between your ribs and your hips (abdominal muscles). These muscles help to support your body and keep your spine stable. It is important to maintain strength in your core to prevent injury and pain. Some activities, such as yoga and Pilates, can help to strengthen core muscles. You can also strengthen core muscles with exercises at home. It is important to talk to your health care provider before you start a new exercise routine. What are the benefits of core strength exercises? Core strength exercises can:  Reduce back pain.  Help to rebuild strength after a back or spine injury.  Help to prevent injury during physical activity, especially injuries to the back and knees. How to do core strength exercises Repeat these exercises 10-15 times, or until you are tired. Do exercises exactly as told by your health care provider and adjust them as directed. It is normal to feel mild stretching, pulling, tightness, or discomfort as you do these exercises. If you feel any pain while doing these exercises, stop. If your pain continues or gets worse when doing core exercises, contact your health care provider. You may want to use a padded yoga or exercise mat for strength exercises that are done on the floor. Bridging  1. Lie on your back on a firm surface with your knees bent and your feet flat on the floor. 2. Raise your hips so that your knees, hips, and shoulders form a straight line together. Keep your abdominal muscles tight. 3. Hold this position for 3-5 seconds. 4. Slowly lower your hips to the starting position. 5. Let your muscles relax completely between repetitions. Single-leg bridge 1. Lie on your back on a firm surface with your knees bent and your feet flat on the floor. 2. Raise your hips so that your knees, hips, and shoulders form  a straight line together. Keep your abdominal muscles tight. 3. Lift one foot off the floor, then completely straighten that leg. 4. Hold this position for 3-5 seconds. 5. Put the straight leg back down in the bent position. 6. Slowly lower your hips to the starting position. 7. Repeat these steps using your other leg. Side bridge 1. Lie on your side with your knees bent. Prop yourself up on the elbow that is near the floor. 2. Using your abdominal muscles and your elbow that is on the floor, raise your body off the floor. Raise your hip so that your shoulder, hip, and foot form a straight line together. 3. Hold this position for 10 seconds. Keep your head and neck raised and away from your shoulder (in their normal, neutral position). Keep your abdominal muscles tight. 4. Slowly lower your hip to the starting position. 5. Repeat and try to hold this position longer, working your way up to 30 seconds. Abdominal crunch 1. Lie on your back on a firm surface. Bend your knees and keep your feet flat on the floor. 2. Cross your arms over your chest. 3. Without bending your neck, tip  your chin slightly toward your chest. 4. Tighten your abdominal muscles as you lift your chest just high enough to lift your shoulder blades off of the floor. Do not hold your breath. You can do this with short lifts or long lifts. 5. Slowly return to the starting position. Bird dog 1. Get on your hands and knees, with your legs shoulder-width apart and your arms under your shoulders. Keep your back straight. 2. Tighten your abdominal muscles. 3. Raise one of your legs off the floor and straighten it. Try to keep it parallel to the floor. 4. Slowly lower your leg to the starting position. 5. Raise one of your arms off the floor and straighten it. Try to keep it parallel to the floor. 6. Slowly lower your arm to the starting position. 7. Repeat with the other arm and leg. If possible, try raising a leg and arm at the  same time, on opposite sides of the body. For example, raise your left hand and your right leg. Plank 1. Lie on your belly. 2. Prop up your body onto your forearms and your feet, keeping your legs straight. Your body should make a straight line between your shoulders and feet. 3. Hold this position for 10 seconds while keeping your abdominal muscles tight. 4. Lower your body to the starting position. 5. Repeat and try to hold this position longer, working your way up to 30 seconds. Cross-core strengthening 1. Stand with your feet shoulder-width apart. 2. Hold a ball out in front of you. Keep your arms straight. 3. Tighten your abdominal muscles and slowly rotate at your waist from side to side. Keep your feet flat. 4. Once you are comfortable, try repeating this exercise with a heavier ball. Top core strengthening 1. Stand about 18 inches (46 cm) in front of a wall, with your back to the wall. 2. Keep your feet flat and shoulder-width apart. 3. Tighten your abdominal muscles. 4. Bend your hips and knees. 5. Slowly reach between your legs to touch the wall behind you. 6. Slowly stand back up. 7. Raise your arms over your head and reach behind you. 8. Return to the starting position. General tips  Do not do any exercises that cause pain. If you have pain while exercising, talk to your health care provider.  Always stretch before and after doing these exercises. This can help prevent injury.  Maintain a healthy weight. Ask your health care provider what weight is healthy for you. Contact a health care provider if:  You have back pain that gets worse or does not go away.  You feel pain while doing core strength exercises. Get help right away if:  You have severe pain that does not get better with medicine. Summary  Core exercises help to build strength in the muscles between your ribs and your waist.  Core muscles help to support your body and keep your spine stable.  Some  activities, such as yoga and Pilates, can help to strengthen core muscles.  Core strength exercises can help back pain and can prevent injury.  If you feel any pain while doing core strength exercises, stop. This information is not intended to replace advice given to you by your health care provider. Make sure you discuss any questions you have with your health care provider. Document Revised: 06/26/2018 Document Reviewed: 07/26/2016 Elsevier Patient Education  [place]. Back Exercises These exercises help to make your trunk and back strong. They also help to keep the lower  back flexible. Doing these exercises can help to prevent back pain or lessen existing pain.  If you have back pain, try to do these exercises 2-3 times each day or as told by your doctor.  As you get better, do the exercises once each day. Repeat the exercises more often as told by your doctor.  To stop back pain from coming back, do the exercises once each day, or as told by your doctor. Exercises Single knee to chest Do these steps 3-5 times in a row for each leg: 1. Lie on your back on a firm bed or the floor with your legs stretched out. 2. Bring one knee to your chest. 3. Grab your knee or thigh with both hands and hold them it in place. 4. Pull on your knee until you feel a gentle stretch in your lower back or buttocks. 5. Keep doing the stretch for 10-30 seconds. 6. Slowly let go of your leg and straighten it. Pelvic tilt Do these steps 5-10 times in a row: 1. Lie on your back on a firm bed or the floor with your legs stretched out. 2. Bend your knees so they point up to the ceiling. Your feet should be flat on the floor. 3. Tighten your lower belly (abdomen) muscles to press your lower back against the floor. This will make your tailbone point up to the ceiling instead of pointing down to your feet or the floor. 4. Stay in this position for 5-10 seconds while you gently tighten your muscles and  breathe evenly. Cat-cow Do these steps until your lower back bends more easily: 1. Get on your hands and knees on a firm surface. Keep your hands under your shoulders, and keep your knees under your hips. You may put padding under your knees. 2. Let your head hang down toward your chest. Tighten (contract) the muscles in your belly. Point your tailbone toward the floor so your lower back becomes rounded like the back of a cat. 3. Stay in this position for 5 seconds. 4. Slowly lift your head. Let the muscles of your belly relax. Point your tailbone up toward the ceiling so your back forms a sagging arch like the back of a cow. 5. Stay in this position for 5 seconds.  Press-ups Do these steps 5-10 times in a row: 1. Lie on your belly (face-down) on the floor. 2. Place your hands near your head, about shoulder-width apart. 3. While you keep your back relaxed and keep your hips on the floor, slowly straighten your arms to raise the top half of your body and lift your shoulders. Do not use your back muscles. You may change where you place your hands in order to make yourself more comfortable. 4. Stay in this position for 5 seconds. 5. Slowly return to lying flat on the floor.  Bridges Do these steps 10 times in a row: 1. Lie on your back on a firm surface. 2. Bend your knees so they point up to the ceiling. Your feet should be flat on the floor. Your arms should be flat at your sides, next to your body. 3. Tighten your butt muscles and lift your butt off the floor until your waist is almost as high as your knees. If you do not feel the muscles working in your butt and the back of your thighs, slide your feet 1-2 inches farther away from your butt. 4. Stay in this position for 3-5 seconds. 5. Slowly lower your butt to  the floor, and let your butt muscles relax. If this exercise is too easy, try doing it with your arms crossed over your chest. Belly crunches Do these steps 5-10 times in a  row: 1. Lie on your back on a firm bed or the floor with your legs stretched out. 2. Bend your knees so they point up to the ceiling. Your feet should be flat on the floor. 3. Cross your arms over your chest. 4. Tip your chin a [place] bit toward your chest but do not bend your neck. 5. Tighten your belly muscles and slowly raise your chest just enough to lift your shoulder blades a tiny bit off of the floor. Avoid raising your body higher than that, because it can put too much stress on your low back. 6. Slowly lower your chest and your head to the floor. Back lifts Do these steps 5-10 times in a row: 1. Lie on your belly (face-down) with your arms at your sides, and rest your forehead on the floor. 2. Tighten the muscles in your legs and your butt. 3. Slowly lift your chest off of the floor while you keep your hips on the floor. Keep the back of your head in line with the curve in your back. Look at the floor while you do this. 4. Stay in this position for 3-5 seconds. 5. Slowly lower your chest and your face to the floor. Contact a doctor if:  Your back pain gets a lot worse when you do an exercise.  Your back pain does not get better 2 hours after you exercise. If you have any of these problems, stop doing the exercises. Do not do them again unless your doctor says it is okay. Get help right away if:  You have sudden, very bad back pain. If this happens, stop doing the exercises. Do not do them again unless your doctor says it is okay. This information is not intended to replace advice given to you by your health care provider. Make sure you discuss any questions you have with your health care provider. Document Revised: 11/29/2017 Document Reviewed: 11/29/2017 Elsevier Patient Education  2020 [employer]ers, 15(4), [phone]. Retrieved December 24, 2017 from [email or web address].com/nursing">  Knee Exercises Ask your health care provider which exercises are  safe for you. Do exercises exactly as told by your health care provider and adjust them as directed. It is normal to feel mild stretching, pulling, tightness, or discomfort as you do these exercises. Stop right away if you feel sudden pain or your pain gets worse. Do not begin these exercises until told by your health care provider. Stretching and range-of-motion exercises These exercises warm up your muscles and joints and improve the movement and flexibility of your knee. These exercises also help to relieve pain and swelling. Knee extension, prone 1. Lie on your abdomen (prone position) on a bed. 2. Place your left / right knee just beyond the edge of the surface so your knee is not on the bed. You can put a towel under your left / right thigh just above your kneecap for comfort. 3. Relax your leg muscles and allow gravity to straighten your knee (extension). You should feel a stretch behind your left / right knee. 4. Hold this position for __________ seconds. 5. Scoot up so your knee is supported between repetitions. Repeat __________ times. Complete this exercise __________ times a day. Knee flexion, active  7. Lie on your back with both legs straight.  If this causes back discomfort, bend your left / right knee so your foot is flat on the floor. 8. Slowly slide your left / right heel back toward your buttocks. Stop when you feel a gentle stretch in the front of your knee or thigh (flexion). 9. Hold this position for __________ seconds. 10. Slowly slide your left / right heel back to the starting position. Repeat __________ times. Complete this exercise __________ times a day. Quadriceps stretch, prone  5. Lie on your abdomen on a firm surface, such as a bed or padded floor. 6. Bend your left / right knee and hold your ankle. If you cannot reach your ankle or pant leg, loop a belt around your foot and grab the belt instead. 7. Gently pull your heel toward your buttocks. Your knee should not  slide out to the side. You should feel a stretch in the front of your thigh and knee (quadriceps). 8. Hold this position for __________ seconds. Repeat __________ times. Complete this exercise __________ times a day. Hamstring, supine 6. Lie on your back (supine position). 7. Loop a belt or towel over the ball of your left / right foot. The ball of your foot is on the walking surface, right under your toes. 8. Straighten your left / right knee and slowly pull on the belt to raise your leg until you feel a gentle stretch behind your knee (hamstring). ? Do not let your knee bend while you do this. ? Keep your other leg flat on the floor. 9. Hold this position for __________ seconds. Repeat __________ times. Complete this exercise __________ times a day. Strengthening exercises These exercises build strength and endurance in your knee. Endurance is the ability to use your muscles for a long time, even after they get tired. Quadriceps, isometric This exercise stretches the muscles in front of your thigh (quadriceps) without moving your knee joint (isometric). 6. Lie on your back with your left / right leg extended and your other knee bent. Put a rolled towel or small pillow under your knee if told by your health care provider. 7. Slowly tense the muscles in the front of your left / right thigh. You should see your kneecap slide up toward your hip or see increased dimpling just above the knee. This motion will push the back of the knee toward the floor. 8. For __________ seconds, hold the muscle as tight as you can without increasing your pain. 9. Relax the muscles slowly and completely. Repeat __________ times. Complete this exercise __________ times a day. Straight leg raises This exercise stretches the muscles in front of your thigh (quadriceps) and the muscles that move your hips (hip flexors). 6. Lie on your back with your left / right leg extended and your other knee bent. 7. Tense the  muscles in the front of your left / right thigh. You should see your kneecap slide up or see increased dimpling just above the knee. Your thigh may even shake a bit. 8. Keep these muscles tight as you raise your leg 4-6 inches (10-15 cm) off the floor. Do not let your knee bend. 9. Hold this position for __________ seconds. 10. Keep these muscles tense as you lower your leg. 11. Relax your muscles slowly and completely after each repetition. Repeat __________ times. Complete this exercise __________ times a day. Hamstring, isometric 7. Lie on your back on a firm surface. 8. Bend your left / right knee about __________ degrees. 9. Dig your left / right  heel into the surface as if you are trying to pull it toward your buttocks. Tighten the muscles in the back of your thighs (hamstring) to "dig" as hard as you can without increasing any pain. 10. Hold this position for __________ seconds. 11. Release the tension gradually and allow your muscles to relax completely for __________ seconds after each repetition. Repeat __________ times. Complete this exercise __________ times a day. Hamstring curls If told by your health care provider, do this exercise while wearing ankle weights. Begin with __________ lb weights. Then increase the weight by 1 lb (0.5 kg) increments. Do not wear ankle weights that are more than __________ lb. 6. Lie on your abdomen with your legs straight. 7. Bend your left / right knee as far as you can without feeling pain. Keep your hips flat against the floor. 8. Hold this position for __________ seconds. 9. Slowly lower your leg to the starting position. Repeat __________ times. Complete this exercise __________ times a day. Squats This exercise strengthens the muscles in front of your thigh and knee (quadriceps). 1. Stand in front of a table, with your feet and knees pointing straight ahead. You may rest your hands on the table for balance but not for support. 2. Slowly bend  your knees and lower your hips like you are going to sit in a chair. ? Keep your weight over your heels, not over your toes. ? Keep your lower legs upright so they are parallel with the table legs. ? Do not let your hips go lower than your knees. ? Do not bend lower than told by your health care provider. ? If your knee pain increases, do not bend as low. 3. Hold the squat position for __________ seconds. 4. Slowly push with your legs to return to standing. Do not use your hands to pull yourself to standing. Repeat __________ times. Complete this exercise __________ times a day. Wall slides This exercise strengthens the muscles in front of your thigh and knee (quadriceps). 1. Lean your back against a smooth wall or door, and walk your feet out 18-24 inches (46-61 cm) from it. 2. Place your feet hip-width apart. 3. Slowly slide down the wall or door until your knees bend __________ degrees. Keep your knees over your heels, not over your toes. Keep your knees in line with your hips. 4. Hold this position for __________ seconds. Repeat __________ times. Complete this exercise __________ times a day. Straight leg raises This exercise strengthens the muscles that rotate the leg at the hip and move it away from your body (hip abductors). 1. Lie on your side with your left / right leg in the top position. Lie so your head, shoulder, knee, and hip line up. You may bend your bottom knee to help you keep your balance. 2. Roll your hips slightly forward so your hips are stacked directly over each other and your left / right knee is facing forward. 3. Leading with your heel, lift your top leg 4-6 inches (10-15 cm). You should feel the muscles in your outer hip lifting. ? Do not let your foot drift forward. ? Do not let your knee roll toward the ceiling. 4. Hold this position for __________ seconds. 5. Slowly return your leg to the starting position. 6. Let your muscles relax completely after each  repetition. Repeat __________ times. Complete this exercise __________ times a day. Straight leg raises This exercise stretches the muscles that move your hips away from the front of the  pelvis (hip extensors). 1. Lie on your abdomen on a firm surface. You can put a pillow under your hips if that is more comfortable. 2. Tense the muscles in your buttocks and lift your left / right leg about 4-6 inches (10-15 cm). Keep your knee straight as you lift your leg. 3. Hold this position for __________ seconds. 4. Slowly lower your leg to the starting position. 5. Let your leg relax completely after each repetition. Repeat __________ times. Complete this exercise __________ times a day. This information is not intended to replace advice given to you by your health care provider. Make sure you discuss any questions you have with your health care provider. Document Revised: 12/25/2017 Document Reviewed: 12/25/2017 [employer] Patient Education  2020 [employer].

## 2019-11-12 NOTE — Telephone Encounter (Signed)
Please apply for right knee visco, per Taylor Dale, PA-C. Thanks!  

## 2019-11-14 ENCOUNTER — Ambulatory Visit: Payer: 59 | Admitting: Internal Medicine

## 2019-11-14 ENCOUNTER — Other Ambulatory Visit: Payer: Self-pay

## 2019-11-14 ENCOUNTER — Encounter: Payer: Self-pay | Admitting: Internal Medicine

## 2019-11-14 VITALS — BP 124/72 | HR 60 | Ht 62.0 in | Wt 167.0 lb

## 2019-11-14 DIAGNOSIS — R002 Palpitations: Secondary | ICD-10-CM

## 2019-11-14 DIAGNOSIS — G459 Transient cerebral ischemic attack, unspecified: Secondary | ICD-10-CM | POA: Diagnosis not present

## 2019-11-14 DIAGNOSIS — R001 Bradycardia, unspecified: Secondary | ICD-10-CM

## 2019-11-14 NOTE — Progress Notes (Signed)
PCP: Paula Albee, MD Primary Cardiologist: Paula Harl Bowie Primary EP: [name] is a 54 y.o. female who presents today for routine electrophysiology followup.  Since last being seen in our clinic, the patient reports doing very well.  She continues to have short palpitations of unclear nature. Today, she denies symptoms of  chest pain, shortness of breath,  lower extremity edema, dizziness, presyncope, or syncope.  The patient is otherwise without complaint today.   Past Medical History:  Diagnosis Date  . DDD (degenerative disc disease), lumbar 01/19/2016  . History of TIA (transient ischemic attack) 01/19/2016  . Hypothyroidism   . Osteoarthritis of both feet 01/19/2016  . Osteoarthritis of both knees 01/19/2016  . PONV (postoperative nausea and vomiting)   . Stroke [facility]) 07/2010   "TIA" per patient  . Vitamin D deficiency disease 11/28/2018   Past Surgical History:  Procedure Laterality Date  . ABDOMINAL HYSTERECTOMY    . BIOPSY  05/15/2018   Procedure: BIOPSY;  Surgeon: Daneil Dolin, MD;  [facility];  Service: Endoscopy;;  polyp cb  . CHOLECYSTECTOMY    . COLONOSCOPY N/A 05/15/2018   Procedure: COLONOSCOPY;  Surgeon: Daneil Dolin, MD;  [facility];  Service: Endoscopy;  Laterality: N/A;  12:00pm  . EYE SURGERY    . KNEE ARTHROSCOPY     x2  . LAPAROSCOPIC ASSISTED VAGINAL HYSTERECTOMY  10/11/2011   Procedure: LAPAROSCOPIC ASSISTED VAGINAL HYSTERECTOMY;  Surgeon: Margarette Asal, MD;  Location: [place] [facility];  Service: Gynecology;  Laterality: N/A;  . LASIK    . POLYPECTOMY  05/15/2018   Procedure: POLYPECTOMY;  Surgeon: Daneil Dolin, MD;  [facility];  Service: Endoscopy;;  colon     ROS- all systems are reviewed and negatives except as per HPI above  Current Outpatient Medications  Medication Sig Dispense Refill  . aspirin 325 MG EC tablet Take 325 mg by mouth daily.    . baclofen (LIORESAL) 10 MG tablet TAKE 1  TABLET(10 MG) BY MOUTH DAILY AS NEEDED FOR MUSCLE SPASMS 30 tablet 0  . Cholecalciferol (VITAMIN D-3) 125 MCG (5000 UT) TABS Take 10,000 Units by mouth daily at 12 noon.    Marland Kitchen estradiol (ESTRACE) 0.5 MG tablet TAKE 1 TABLET(0.5 MG) BY MOUTH DAILY 30 tablet 3  . Misc Natural Products (TART CHERRY ADVANCED PO) Take 1 capsule by mouth daily.     . pravastatin (PRAVACHOL) 10 MG tablet TAKE 1 TABLET BY MOUTH EVERY EVENING 90 tablet 0  . progesterone (PROMETRIUM) 200 MG capsule Take 1 capsule (200 mg total) by mouth daily. 90 capsule 1  . Testosterone Propionate (FIRST-TESTOSTERONE MC) 2 % CREA Place 5 mg onto the skin daily. 30 g 0  . thyroid (NP THYROID) 60 MG tablet Take 2 tablets (120 mg total) by mouth daily before breakfast. 60 tablet 3  . TURMERIC PO Take 1 capsule by mouth daily.     No current facility-administered medications for this visit.    Physical Exam: Vitals:   11/14/19 1135  BP: 124/72  Pulse: 60  SpO2: 98%  Weight: 167 lb (75.8 kg)  Height: 5\' 2"  (1.575 m)    GEN- The patient is well appearing, alert and oriented x 3 today.   Head- normocephalic, atraumatic Eyes-  Sclera clear, conjunctiva pink Ears- hearing intact Oropharynx- clear Lungs- Clear to ausculation bilaterally, normal work of breathing Heart- Regular rate and rhythm, no murmurs, rubs or gallops, PMI not laterally  displaced GI- soft, NT, ND, + BS Extremities- no clubbing, cyanosis, or edema  Wt Readings from Last 3 Encounters:  11/14/19 167 lb (75.8 kg)  11/12/19 166 lb (75.3 kg)  10/02/19 162 lb 6.4 oz (73.7 kg)    EKG tracing ordered today is personally reviewed and shows sinus  Assessment and Plan:  1. Palpitations She is very concerned about these.  She has worn two prior monitors which were unrevealing.  She has a h/o TIA and therefore I think that it is prudent to monitor for afib.  I would therefore advise implantation of an implantable loop recorder for long term arrhythmia monitoring.   Risks and benefits to ILR were discussed at length with the patient today, including but not limited to risks of bleeding and infection.  Extensive device education was performed.  Remote monitoring was also discussed at length today.  The patient understands and wishes to proceed.  We will proceed at this time with ILR implantation.   Thompson Grayer MD, [facility] 11/14/2019 11:43 AM      DESCRIPTION OF PROCEDURE:  Informed written consent was obtained.  The patient required no sedation for the procedure today.  The patients left chest was prepped and draped. Mapping over the patient's chest was performed to identify the appropriate ILR site.  This area was found to be the left parasternal region over the 3rd-4th intercostal space.  The skin overlying this region was infiltrated with lidocaine for local analgesia.  A 0.5-cm incision was made at the implant site.  A subcutaneous ILR pocket was fashioned using a combination of sharp and blunt dissection.  A Medtronic Reveal Linq model LNQ 22 (SN [id number])  implantable loop recorder was then placed into the pocket R waves were very prominent and measured > 0.2 mV. EBL<1 ml.  Steri- Strips and a sterile dressing were then applied.  There were no early apparent complications.     CONCLUSIONS:   1. Successful implantation of a Medtronic Reveal LINQ implantable loop recorder for evaluation of palpitations and to exclude AF s/p recurrent TIA  2. No early apparent complications.   Thompson Grayer MD, [facility] 11/14/2019 11:43 AM

## 2019-11-14 NOTE — Patient Instructions (Addendum)
Medication Instructions:  Your physician recommends that you continue on your current medications as directed. Please refer to the Current Medication list given to you today.  Labwork: None ordered.  Testing/Procedures: None ordered.   Your physician wants you to follow-up in: 02/09/20 at 3 pm at the [address] office.    Implantable Loop Recorder Placement, Care After This sheet gives you information about how to care for yourself after your procedure. Your health care provider may also give you more specific instructions. If you have problems or questions, contact your health care provider. What can I expect after the procedure? After the procedure, it is common to have:  Soreness or discomfort near the incision.  Some swelling or bruising near the incision.  Follow these instructions at home: Incision care  1.  Leave your outer dressing on for 24 hours.  After 24 hours you can remove your outer dressing and shower. 2. Leave adhesive strips in place. These skin closures may need to stay in place for 1-2 weeks. If adhesive strip edges start to loosen and curl up, you may trim the loose edges.  You may remove the strips if they have not fallen off after 2 weeks. 3. Check your incision area every day for signs of infection. Check for: a. Redness, swelling, or pain. b. Fluid or blood. c. Warmth. d. Pus or a bad smell. 4. Do not take baths, swim, or use a hot tub until your incision is completely healed. 5. If your wound site starts to bleed apply pressure.      If you have any questions/concerns please call the device clinic at [phone].  Activity  Return to your normal activities.  General instructions  Follow instructions from your health care provider about how to manage your implantable loop recorder and transmit the information. Learn how to activate a recording if this is necessary for your type of device.  Do not go through a metal detection gate, and do not let  someone hold a metal detector over your chest. Show your ID card.  Do not have an MRI unless you check with your health care provider first.  Take over-the-counter and prescription medicines only as told by your health care provider.  Keep all follow-up visits as told by your health care provider. This is important. Contact a health care provider if:  You have redness, swelling, or pain around your incision.  You have a fever.  You have pain that is not relieved by your pain medicine.  You have triggered your device because of fainting (syncope) or because of a heartbeat that feels like it is racing, slow, fluttering, or skipping (palpitations). Get help right away if you have:  Chest pain.  Difficulty breathing. Summary  After the procedure, it is common to have soreness or discomfort near the incision.  Change your dressing as told by your health care provider.  Follow instructions from your health care provider about how to manage your implantable loop recorder and transmit the information.  Keep all follow-up visits as told by your health care provider. This is important. This information is not intended to replace advice given to you by your health care provider. Make sure you discuss any questions you have with your health care provider. Document Released: 02/15/2015 Document Revised: 04/21/2017 Document Reviewed: 04/21/2017 Elsevier Patient Education  2020 [employer].

## 2019-11-14 NOTE — Telephone Encounter (Signed)
Submitted for VOB 11/14/19.

## 2019-11-15 HISTORY — PX: OTHER SURGICAL HISTORY: SHX169

## 2019-11-19 NOTE — Telephone Encounter (Signed)
Please call to schedule Visco knee Injections.  Authorized for Euflexxa series Right Knee. Buy and [employer] Deductible does not apply. No PA required. Insurance to cover 100% of allowable amount with a $17.00 co pay each visit. If OOP is met co pay is waived. (as of date $126.00 of $500.00 has been met)

## 2019-11-30 ENCOUNTER — Other Ambulatory Visit: Payer: Self-pay | Admitting: Rheumatology

## 2019-12-01 ENCOUNTER — Ambulatory Visit: Payer: 59 | Admitting: Physician Assistant

## 2019-12-01 ENCOUNTER — Other Ambulatory Visit: Payer: Self-pay

## 2019-12-01 DIAGNOSIS — M1711 Unilateral primary osteoarthritis, right knee: Secondary | ICD-10-CM

## 2019-12-01 MED ORDER — SODIUM HYALURONATE (VISCOSUP) 20 MG/2ML IX SOSY
20.0000 mg | PREFILLED_SYRINGE | INTRA_ARTICULAR | Status: AC | PRN
  Administered 2019-12-01: 20 mg via INTRA_ARTICULAR

## 2019-12-01 MED ORDER — LIDOCAINE HCL 1 % IJ SOLN
1.5000 mL | INTRAMUSCULAR | Status: AC | PRN
  Administered 2019-12-01: 1.5 mL

## 2019-12-01 NOTE — Telephone Encounter (Signed)
Last Visit: 11/12/2019 Next Visit: 05/12/2020  Last Fill: 10/31/2019  Okay to refill Baclofen?

## 2019-12-01 NOTE — Progress Notes (Signed)
   Procedure Note  Patient: [name]             Date of Birth: 06-06-1965           MRN: [id number]             Visit Date: 12/01/2019  Procedures: Visit Diagnoses:  1. Primary osteoarthritis of right knee     Euflexxa #1 right knee B/B Large Joint Inj on 12/01/2019 11:53 AM Indications: pain Details: 27 G 1.5 in needle, medial approach  Arthrogram: No  Medications: 1.5 mL lidocaine 1 %; 20 mg Sodium Hyaluronate 20 MG/2ML Aspirate: 0 mL Outcome: tolerated well, no immediate complications Procedure, treatment alternatives, risks and benefits explained, specific risks discussed. Consent was given by the patient. Immediately prior to procedure a time out was called to verify the correct patient, procedure, equipment, support staff and site/side marked as required. Patient was prepped and draped in the usual sterile fashion.     Patient tolerated the procedure well.  Aftercare was discussed.  Hazel Sams, PA-C

## 2019-12-15 ENCOUNTER — Telehealth: Payer: Self-pay

## 2019-12-15 ENCOUNTER — Other Ambulatory Visit: Payer: Self-pay

## 2019-12-15 ENCOUNTER — Ambulatory Visit (INDEPENDENT_AMBULATORY_CARE_PROVIDER_SITE_OTHER): Payer: 59 | Admitting: Physician Assistant

## 2019-12-15 DIAGNOSIS — M1711 Unilateral primary osteoarthritis, right knee: Secondary | ICD-10-CM | POA: Diagnosis not present

## 2019-12-15 MED ORDER — SODIUM HYALURONATE (VISCOSUP) 20 MG/2ML IX SOSY
20.0000 mg | PREFILLED_SYRINGE | INTRA_ARTICULAR | Status: AC | PRN
  Administered 2019-12-15: 20 mg via INTRA_ARTICULAR

## 2019-12-15 MED ORDER — LIDOCAINE HCL 1 % IJ SOLN
1.5000 mL | INTRAMUSCULAR | Status: AC | PRN
  Administered 2019-12-15: 1.5 mL

## 2019-12-15 NOTE — Telephone Encounter (Signed)
Opened in error

## 2019-12-15 NOTE — Progress Notes (Signed)
° °  Procedure Note  Patient: [name]             Date of Birth: 06-15-1965           MRN: [id number]             Visit Date: 12/15/2019  Procedures: Visit Diagnoses:  1. Primary osteoarthritis of right knee      Euflexxa #2 right knee, B/B Large Joint Inj: R knee on 12/15/2019 10:40 AM Indications: pain Details: 27 G 1.5 in needle, medial approach  Arthrogram: No  Medications: 1.5 mL lidocaine 1 %; 20 mg Sodium Hyaluronate 20 MG/2ML Aspirate: 0 mL Outcome: tolerated well, no immediate complications Procedure, treatment alternatives, risks and benefits explained, specific risks discussed. Consent was given by the patient. Immediately prior to procedure a time out was called to verify the correct patient, procedure, equipment, support staff and site/side marked as required. Patient was prepped and draped in the usual sterile fashion.     Patient tolerated the procedure well.  Aftercare was discussed.  Hazel Sams, PA-C

## 2019-12-18 ENCOUNTER — Ambulatory Visit (INDEPENDENT_AMBULATORY_CARE_PROVIDER_SITE_OTHER): Payer: 59

## 2019-12-18 DIAGNOSIS — R001 Bradycardia, unspecified: Secondary | ICD-10-CM

## 2019-12-18 LAB — CUP PACEART REMOTE DEVICE CHECK
Date Time Interrogation Session: 20210929200912
Implantable Pulse Generator Implant Date: 20210827

## 2019-12-22 ENCOUNTER — Ambulatory Visit (INDEPENDENT_AMBULATORY_CARE_PROVIDER_SITE_OTHER): Payer: 59 | Admitting: Physician Assistant

## 2019-12-22 ENCOUNTER — Other Ambulatory Visit: Payer: Self-pay

## 2019-12-22 DIAGNOSIS — M1711 Unilateral primary osteoarthritis, right knee: Secondary | ICD-10-CM

## 2019-12-22 MED ORDER — LIDOCAINE HCL 1 % IJ SOLN
1.5000 mL | INTRAMUSCULAR | Status: AC | PRN
  Administered 2019-12-22: 1.5 mL

## 2019-12-22 MED ORDER — SODIUM HYALURONATE (VISCOSUP) 20 MG/2ML IX SOSY
20.0000 mg | PREFILLED_SYRINGE | INTRA_ARTICULAR | Status: AC | PRN
  Administered 2019-12-22: 20 mg via INTRA_ARTICULAR

## 2019-12-22 NOTE — Progress Notes (Signed)
   Procedure Note  Patient: [name]             Date of Birth: Jan 16, 1966           MRN: [id number]             Visit Date: 12/22/2019  Procedures: Visit Diagnoses:  1. Primary osteoarthritis of right knee     Euflexxa #3 right knee, B/B Large Joint Inj: R knee on 12/22/2019 1:49 PM Indications: pain Details: 27 G 1.5 in needle, medial approach  Arthrogram: No  Medications: 1.5 mL lidocaine 1 %; 20 mg Sodium Hyaluronate 20 MG/2ML Aspirate: 0 mL Outcome: tolerated well, no immediate complications Procedure, treatment alternatives, risks and benefits explained, specific risks discussed. Consent was given by the patient. Immediately prior to procedure a time out was called to verify the correct patient, procedure, equipment, support staff and site/side marked as required. Patient was prepped and draped in the usual sterile fashion.

## 2019-12-22 NOTE — Progress Notes (Signed)
Carelink Summary Report / Loop Recorder 

## 2019-12-30 ENCOUNTER — Other Ambulatory Visit: Payer: Self-pay | Admitting: Rheumatology

## 2019-12-30 NOTE — Telephone Encounter (Signed)
Last Visit: 11/12/2019 Next Visit: 05/12/2020  Current Dose per office note on 11/12/2019:baclofen 10 mg in the morning daily for muscle spasms and muscle tension Last fill: 12/01/2019   Okay to refill baclofen?

## 2020-01-06 ENCOUNTER — Encounter (INDEPENDENT_AMBULATORY_CARE_PROVIDER_SITE_OTHER): Payer: 59 | Admitting: Internal Medicine

## 2020-01-20 ENCOUNTER — Ambulatory Visit (INDEPENDENT_AMBULATORY_CARE_PROVIDER_SITE_OTHER): Payer: 59

## 2020-01-20 DIAGNOSIS — R001 Bradycardia, unspecified: Secondary | ICD-10-CM | POA: Diagnosis not present

## 2020-01-20 LAB — CUP PACEART REMOTE DEVICE CHECK
Date Time Interrogation Session: 20211101200803
Implantable Pulse Generator Implant Date: 20210827

## 2020-01-22 NOTE — Progress Notes (Signed)
Carelink Summary Report / Loop Recorder 

## 2020-01-26 ENCOUNTER — Encounter (INDEPENDENT_AMBULATORY_CARE_PROVIDER_SITE_OTHER): Payer: Self-pay | Admitting: Internal Medicine

## 2020-01-26 ENCOUNTER — Other Ambulatory Visit: Payer: Self-pay

## 2020-01-26 ENCOUNTER — Ambulatory Visit (INDEPENDENT_AMBULATORY_CARE_PROVIDER_SITE_OTHER): Payer: 59 | Admitting: Internal Medicine

## 2020-01-26 VITALS — BP 124/80 | HR 62 | Temp 97.7°F | Resp 18 | Ht 62.0 in | Wt 171.6 lb

## 2020-01-26 DIAGNOSIS — Z131 Encounter for screening for diabetes mellitus: Secondary | ICD-10-CM

## 2020-01-26 DIAGNOSIS — Z13220 Encounter for screening for lipoid disorders: Secondary | ICD-10-CM

## 2020-01-26 DIAGNOSIS — R5381 Other malaise: Secondary | ICD-10-CM | POA: Diagnosis not present

## 2020-01-26 DIAGNOSIS — N951 Menopausal and female climacteric states: Secondary | ICD-10-CM | POA: Diagnosis not present

## 2020-01-26 DIAGNOSIS — Z0001 Encounter for general adult medical examination with abnormal findings: Secondary | ICD-10-CM

## 2020-01-26 DIAGNOSIS — R5383 Other fatigue: Secondary | ICD-10-CM

## 2020-01-26 DIAGNOSIS — E559 Vitamin D deficiency, unspecified: Secondary | ICD-10-CM

## 2020-01-26 DIAGNOSIS — R002 Palpitations: Secondary | ICD-10-CM | POA: Diagnosis not present

## 2020-01-26 DIAGNOSIS — Z1159 Encounter for screening for other viral diseases: Secondary | ICD-10-CM

## 2020-01-26 DIAGNOSIS — F520 Hypoactive sexual desire disorder: Secondary | ICD-10-CM

## 2020-01-26 NOTE — Progress Notes (Signed)
Chief Complaint: This delightful 54 year old lady comes in for an annual physical exam and to address her chronic conditions which are described below. HPI: She has a history of hypothyroidism, cerebrovascular disease with 2 TIAs in the past, vitamin D deficiency, obesity, bioidentical hormone therapy as she is menopausal, having had a hysterectomy and unilateral oophorectomy. She says that she is under a lot of stress and she tends to stress eat unhealthy foods. Her energy levels are low. Testosterone levels that were checked previously were acceptable but not necessarily optimal.  Past Medical History:  Diagnosis Date  . DDD (degenerative disc disease), lumbar 01/19/2016  . History of TIA (transient ischemic attack) 01/19/2016  . Hypothyroidism   . Osteoarthritis of both feet 01/19/2016  . Osteoarthritis of both knees 01/19/2016  . PONV (postoperative nausea and vomiting)   . Stroke [facility]) 07/2010   "TIA" per patient  . Stroke [facility]) 2018   TIA  . Vitamin D deficiency disease 11/28/2018   Past Surgical History:  Procedure Laterality Date  . ABDOMINAL HYSTERECTOMY  2013   1 OVARY LEFT-BENIGN TUMOR  . BIOPSY  05/15/2018   Procedure: BIOPSY;  Surgeon: Daneil Dolin, MD;  [facility];  Service: Endoscopy;;  polyp cb  . CHOLECYSTECTOMY    . COLONOSCOPY N/A 05/15/2018   Procedure: COLONOSCOPY;  Surgeon: Daneil Dolin, MD;  [facility];  Service: Endoscopy;  Laterality: N/A;  12:00pm  . EYE SURGERY    . implantable loop recorder placement  11/15/19   Medtronic Reveal Linq model LNQ 22 (SN [id number])  implantable loop recorder by Dr Rayann Heman  . KNEE ARTHROSCOPY     x2  . LAPAROSCOPIC ASSISTED VAGINAL HYSTERECTOMY  10/11/2011   Procedure: LAPAROSCOPIC ASSISTED VAGINAL HYSTERECTOMY;  Surgeon: Margarette Asal, MD;  Location: [facility] [facility];  Service: Gynecology;  Laterality: N/A;  . LASIK    . POLYPECTOMY  05/15/2018   Procedure: POLYPECTOMY;  Surgeon: Daneil Dolin, MD;   [facility];  Service: Endoscopy;;  colon      Social History   Social History Narrative   Lives with husband of 2 years(second)  and son/family.Works in [employer] for a long time. Has 68 son 65 years old. Divorced x 1(previously married for 25 yrs), remarried <1. Has 2 dogs.    Social History   Tobacco Use  . Smoking status: Never Smoker  . Smokeless tobacco: Never Used  Substance Use Topics  . Alcohol use: Yes    Comment: occasionally      Allergies:  Allergies  Allergen Reactions  . Celebrex [Celecoxib] Swelling    Facial swelling     Current Meds  Medication Sig  . aspirin 325 MG EC tablet Take 325 mg by mouth daily.  . baclofen (LIORESAL) 10 MG tablet TAKE 1 TABLET(10 MG) BY MOUTH DAILY AS NEEDED FOR MUSCLE SPASMS  . Cholecalciferol (VITAMIN D-3) 125 MCG (5000 UT) TABS Take 10,000 Units by mouth daily at 12 noon.  Marland Kitchen estradiol (ESTRACE) 0.5 MG tablet TAKE 1 TABLET(0.5 MG) BY MOUTH DAILY  . Misc Natural Products (TART CHERRY ADVANCED PO) Take 1 capsule by mouth daily.   . pravastatin (PRAVACHOL) 10 MG tablet TAKE 1 TABLET BY MOUTH EVERY EVENING  . progesterone (PROMETRIUM) 200 MG capsule Take 1 capsule (200 mg total) by mouth daily.  . Testosterone Propionate (FIRST-TESTOSTERONE MC) 2 % CREA Place 5 mg onto the skin daily.  Marland Kitchen thyroid (NP THYROID) 120 MG tablet Take 120 mg  by mouth daily before breakfast.  . TURMERIC PO Take 1 capsule by mouth daily.  . [DISCONTINUED] NP THYROID 120 MG tablet TAKE 1 TABLET BY MOUTH DAILY       Depression screen [facility] 2/9 09/03/2019  Decreased Interest 1  Down, Depressed, Hopeless 3  PHQ - 2 Score 4  Altered sleeping 0  Tired, decreased energy 0  Change in appetite 0  Feeling bad or failure about yourself  1  Trouble concentrating 1  Moving slowly or fidgety/restless 0  Suicidal thoughts 0  PHQ-9 Score 6     ERD:EYCXK from the symptoms mentioned above,there are no other symptoms referable to all systems  reviewed.       Physical Exam: Blood pressure 124/80, pulse 62, temperature 97.7 F (36.5 C), temperature source Temporal, resp. rate 18, height 5\' 2"  (1.575 m), weight 171 lb 9.6 oz (77.8 kg), last menstrual period 01/09/2011, SpO2 98 %. Vitals with BMI 01/26/2020 11/14/2019 11/12/2019  Height 5\' 2"  5\' 2"  5\' 2"   Weight 171 lbs 10 oz 167 lbs 166 lbs  BMI 31.38 48.18 56.31  Systolic 497 026 378  Diastolic 80 72 69  Pulse 62 60 56      She looks systemically well, remains obese. General: Alert, cooperative, and appears to be the stated age.No pallor.  No jaundice.  No clubbing. Head: Normocephalic Eyes: Sclera white, pupils equal and reactive to light, red reflex x 2,  Ears: Normal bilaterally Oral cavity: Lips, mucosa, and tongue normal: Teeth and gums normal Neck: No adenopathy, supple, symmetrical, trachea midline, and thyroid does not appear enlarged. Breast: No masses felt. Respiratory: Clear to auscultation bilaterally.No wheezing, crackles or bronchial breathing. Cardiovascular: Heart sounds are present and appear to be normal without murmurs or added sounds.  No carotid bruits.  Peripheral pulses are present and equal bilaterally.: Gastrointestinal:positive bowel sounds, no hepatosplenomegaly.  No masses felt.No tenderness. Skin: Clear, No rashes noted.No worrisome skin lesions seen. Neurological: Grossly intact without focal findings, cranial nerves II through XII intact, muscle strength equal bilaterally Musculoskeletal: No acute joint abnormalities noted.Full range of movement noted with joints. Psychiatric: Affect appropriate, non-anxious.    Assessment  1. Encounter for general adult medical examination with abnormal findings   2. Malaise and fatigue   3. Hypoactive sexual desire disorder   4. Palpitations   5. Hot flashes due to menopause   6. Vitamin D deficiency disease   7. Screening for diabetes mellitus   8. Screening for lipoid disorders   9. Encounter  for hepatitis C screening test for low risk patient     Tests Ordered:   Orders Placed This Encounter  Procedures  . CBC  . COMPLETE METABOLIC PANEL WITH GFR  . Hemoglobin A1c  . Estradiol  . Progesterone  . Lipid panel  . T3, free  . T4  . TSH  . Testos,Total,Free and SHBG (Female)  . VITAMIN D 25 Hydroxy (Vit-D Deficiency, Fractures)  . Hepatitis C antibody     Plan  1. Blood work is ordered. 2. She will continue with same dose of desiccated NP thyroid but I think there may be room for further adjustment depending on the blood test. 3. She will continue with bioidentical hormone therapy with estradiol, progesterone and testosterone therapy and further recommendations will depend on blood results. 4. I will see her in about 3 months time for follow-up. 5. Today, in addition to a preventative visit, I performed an office visit to address her chronic conditions above.  No orders of the defined types were placed in this encounter.    Gregery Walberg C Roselinda Bahena   01/26/2020, 6:03 PM

## 2020-01-29 ENCOUNTER — Other Ambulatory Visit (INDEPENDENT_AMBULATORY_CARE_PROVIDER_SITE_OTHER): Payer: Self-pay | Admitting: Internal Medicine

## 2020-01-29 ENCOUNTER — Other Ambulatory Visit (INDEPENDENT_AMBULATORY_CARE_PROVIDER_SITE_OTHER): Payer: Self-pay | Admitting: Nurse Practitioner

## 2020-01-29 ENCOUNTER — Other Ambulatory Visit: Payer: Self-pay | Admitting: Rheumatology

## 2020-01-29 LAB — CBC
HCT: 39.9 % (ref 35.0–45.0)
Hemoglobin: 13.4 g/dL (ref 11.7–15.5)
MCH: 30.2 pg (ref 27.0–33.0)
MCHC: 33.6 g/dL (ref 32.0–36.0)
MCV: 89.9 fL (ref 80.0–100.0)
MPV: 11.4 fL (ref 7.5–12.5)
Platelets: 280 10*3/uL (ref 140–400)
RBC: 4.44 10*6/uL (ref 3.80–5.10)
RDW: 11.6 % (ref 11.0–15.0)
WBC: 6.8 10*3/uL (ref 3.8–10.8)

## 2020-01-29 LAB — T3, FREE: T3, Free: 3.8 pg/mL (ref 2.3–4.2)

## 2020-01-29 LAB — HEPATITIS C ANTIBODY
Hepatitis C Ab: NONREACTIVE
SIGNAL TO CUT-OFF: 0.01 (ref <1.00)

## 2020-01-29 LAB — COMPLETE METABOLIC PANEL WITH GFR
AG Ratio: 1.4 (calc) (ref 1.0–2.5)
ALT: 10 U/L (ref 6–29)
AST: 11 U/L (ref 10–35)
Albumin: 4.1 g/dL (ref 3.6–5.1)
Alkaline phosphatase (APISO): 57 U/L (ref 37–153)
BUN: 16 mg/dL (ref 7–25)
CO2: 32 mmol/L (ref 20–32)
Calcium: 9.8 mg/dL (ref 8.6–10.4)
Chloride: 104 mmol/L (ref 98–110)
Creat: 0.56 mg/dL (ref 0.50–1.05)
GFR, Est African American: 124 mL/min/{1.73_m2} (ref >=60)
GFR, Est Non African American: 107 mL/min/{1.73_m2} (ref >=60)
Globulin: 2.9 g/dL (calc) (ref 1.9–3.7)
Glucose, Bld: 101 mg/dL — ABNORMAL HIGH (ref 65–99)
Potassium: 4.1 mmol/L (ref 3.5–5.3)
Sodium: 144 mmol/L (ref 135–146)
Total Bilirubin: 1 mg/dL (ref 0.2–1.2)
Total Protein: 7 g/dL (ref 6.1–8.1)

## 2020-01-29 LAB — TESTOS,TOTAL,FREE AND SHBG (FEMALE)
Free Testosterone: 1.8 pg/mL (ref 0.1–6.4)
Sex Hormone Binding: 92 nmol/L (ref 17–124)
Testosterone, Total, LC-MS-MS: 27 ng/dL (ref 2–45)

## 2020-01-29 LAB — LIPID PANEL
Cholesterol: 127 mg/dL (ref <200)
HDL: 53 mg/dL (ref >=50)
LDL Cholesterol (Calc): 61 mg/dL (calc)
Non-HDL Cholesterol (Calc): 74 mg/dL (calc) (ref <130)
Total CHOL/HDL Ratio: 2.4 (calc) (ref <5.0)
Triglycerides: 44 mg/dL (ref <150)

## 2020-01-29 LAB — PROGESTERONE: Progesterone: 7.8 ng/mL

## 2020-01-29 LAB — ESTRADIOL: Estradiol: 43 pg/mL

## 2020-01-29 LAB — T4: T4, Total: 7.5 ug/dL (ref 5.1–11.9)

## 2020-01-29 LAB — VITAMIN D 25 HYDROXY (VIT D DEFICIENCY, FRACTURES): Vit D, 25-Hydroxy: 100 ng/mL (ref 30–100)

## 2020-01-29 LAB — HEMOGLOBIN A1C
Hgb A1c MFr Bld: 5.1 % of total Hgb (ref <5.7)
Mean Plasma Glucose: 100 (calc)
eAG (mmol/L): 5.5 (calc)

## 2020-01-29 LAB — TSH: TSH: 0.02 mIU/L — ABNORMAL LOW

## 2020-01-29 NOTE — Telephone Encounter (Signed)
Last Visit: 11/12/2019 Next Visit: 05/12/2020  Current Dose per office note on 11/12/2019:baclofen 10 mg in the morning daily for muscle spasms and muscle tension Last fill: 12/30/2019  Okay to refill baclofen?

## 2020-02-02 ENCOUNTER — Other Ambulatory Visit (INDEPENDENT_AMBULATORY_CARE_PROVIDER_SITE_OTHER): Payer: Self-pay | Admitting: Internal Medicine

## 2020-02-02 MED ORDER — PROGESTERONE 200 MG PO CAPS
400.0000 mg | ORAL_CAPSULE | Freq: Every day | ORAL | 1 refills | Status: DC
Start: 2020-02-02 — End: 2020-08-05

## 2020-02-09 ENCOUNTER — Other Ambulatory Visit: Payer: Self-pay

## 2020-02-09 ENCOUNTER — Ambulatory Visit: Payer: 59 | Admitting: Internal Medicine

## 2020-02-09 ENCOUNTER — Encounter: Payer: Self-pay | Admitting: Internal Medicine

## 2020-02-09 VITALS — BP 102/66 | HR 65 | Ht 62.0 in | Wt 171.2 lb

## 2020-02-09 DIAGNOSIS — G459 Transient cerebral ischemic attack, unspecified: Secondary | ICD-10-CM | POA: Diagnosis not present

## 2020-02-09 DIAGNOSIS — R001 Bradycardia, unspecified: Secondary | ICD-10-CM

## 2020-02-09 DIAGNOSIS — R002 Palpitations: Secondary | ICD-10-CM

## 2020-02-09 NOTE — Patient Instructions (Addendum)
Medication Instructions:  Your physician recommends that you continue on your current medications as directed. Please refer to the Current Medication list given to you today.  *If you need a refill on your cardiac medications before your next appointment, please call your pharmacy*  Lab Work: None ordered.  If you have labs (blood work) drawn today and your tests are completely normal, you will receive your results only by:  [place] (if you have MyChart) OR  A paper copy in the mail If you have any lab test that is abnormal or we need to change your treatment, we will call you to review the results.  Testing/Procedures: None ordered.  Follow-Up: At [facility], you and your health needs are our priority.  As part of our continuing mission to provide you with exceptional heart care, we have created designated Provider Care Teams.  These Care Teams include your primary Cardiologist (physician) and Advanced Practice Providers (APPs -  Physician Assistants and Nurse Practitioners) who all work together to provide you with the care you need, when you need it.  We recommend signing up for the patient portal called "MyChart".  Sign up information is provided on this After Visit Summary.  MyChart is used to connect with patients for Virtual Visits (Telemedicine).  Patients are able to view lab/test results, encounter notes, upcoming appointments, etc.  Non-urgent messages can be sent to your provider as well.   To learn more about what you can do with MyChart, go to [email or web address].    Your next appointment:   Your physician wants you to follow-up in: 1 year with Tommye Standard.You will receive a reminder letter in the mail two months in advance. If you don't receive a letter, please call our office to schedule the follow-up appointment.   Other Instructions:

## 2020-02-09 NOTE — Progress Notes (Signed)
PCP: Doree Albee, MD Primary Cardiologist: Dr Harl Bowie Primary EP: Dr Rayann Heman  [name] is a 54 y.o. female who presents today for routine electrophysiology followup.  Since last being seen in our clinic, the patient reports doing very well.  She has rare and short palpitations.  She has fatigue and trouble sleeping which her PCP has attributed to stress.  She sleeps well and has had a negative sleep study in the past.  Today, she denies symptoms of chest pain, shortness of breath,  lower extremity edema, dizziness, presyncope, or syncope.  The patient is otherwise without complaint today.   Past Medical History:  Diagnosis Date  . DDD (degenerative disc disease), lumbar 01/19/2016  . History of TIA (transient ischemic attack) 01/19/2016  . Hypothyroidism   . Osteoarthritis of both feet 01/19/2016  . Osteoarthritis of both knees 01/19/2016  . PONV (postoperative nausea and vomiting)   . Stroke [facility]) 07/2010   "TIA" per patient  . Stroke [facility]) 2018   TIA  . Vitamin D deficiency disease 11/28/2018   Past Surgical History:  Procedure Laterality Date  . ABDOMINAL HYSTERECTOMY  2013   1 OVARY LEFT-BENIGN TUMOR  . BIOPSY  05/15/2018   Procedure: BIOPSY;  Surgeon: Daneil Dolin, MD;  [facility];  Service: Endoscopy;;  polyp cb  . CHOLECYSTECTOMY    . COLONOSCOPY N/A 05/15/2018   Procedure: COLONOSCOPY;  Surgeon: Daneil Dolin, MD;  [facility];  Service: Endoscopy;  Laterality: N/A;  12:00pm  . EYE SURGERY    . implantable loop recorder placement  11/15/19   Medtronic Reveal Linq model LNQ 22 (SN [id number])  implantable loop recorder by Dr Rayann Heman  . KNEE ARTHROSCOPY     x2  . LAPAROSCOPIC ASSISTED VAGINAL HYSTERECTOMY  10/11/2011   Procedure: LAPAROSCOPIC ASSISTED VAGINAL HYSTERECTOMY;  Surgeon: Margarette Asal, MD;  Location: [facility];  Service: Gynecology;  Laterality: N/A;  . LASIK    . POLYPECTOMY  05/15/2018   Procedure: POLYPECTOMY;  Surgeon:  Daneil Dolin, MD;  [facility];  Service: Endoscopy;;  colon     ROS- all systems are reviewed and negatives except as per HPI above  Current Outpatient Medications  Medication Sig Dispense Refill  . aspirin 325 MG EC tablet Take 325 mg by mouth daily.    . baclofen (LIORESAL) 10 MG tablet TAKE 1 TABLET(10 MG) BY MOUTH DAILY AS NEEDED FOR MUSCLE SPASMS 30 tablet 0  . Cholecalciferol (VITAMIN D-3) 125 MCG (5000 UT) TABS Take 10,000 Units by mouth daily at 12 noon.    Marland Kitchen estradiol (ESTRACE) 0.5 MG tablet TAKE 1 TABLET(0.5 MG) BY MOUTH DAILY 30 tablet 3  . Misc Natural Products (TART CHERRY ADVANCED PO) Take 1 capsule by mouth daily.     . pravastatin (PRAVACHOL) 10 MG tablet TAKE 1 TABLET BY MOUTH EVERY EVENING 90 tablet 0  . progesterone (PROMETRIUM) 200 MG capsule Take 2 capsules (400 mg total) by mouth daily. 180 capsule 1  . Testosterone Propionate (FIRST-TESTOSTERONE MC) 2 % CREA Place 5 mg onto the skin daily. 30 g 0  . thyroid (NP THYROID) 120 MG tablet Take 120 mg by mouth daily before breakfast.    . TURMERIC PO Take 1 capsule by mouth daily.     No current facility-administered medications for this visit.    Physical Exam: Vitals:   02/09/20 1520  BP: 102/66  Pulse: 65  SpO2: 98%  Weight: 171 lb 3.2  oz (77.7 kg)  Height: 5\' 2"  (1.575 m)    GEN- The patient is well appearing, alert and oriented x 3 today.   Head- normocephalic, atraumatic Eyes-  Sclera clear, conjunctiva pink Ears- hearing intact Oropharynx- clear Lungs-   normal work of breathing Heart- Regular rate and rhythm  GI- soft  Extremities- no clubbing, cyanosis, or edema  Wt Readings from Last 3 Encounters:  02/09/20 171 lb 3.2 oz (77.7 kg)  01/26/20 171 lb 9.6 oz (77.8 kg)  11/14/19 167 lb (75.8 kg)    EKG tracing ordered today is personally reviewed and shows sinus with short PR  Assessment and Plan:  1. Palpitations  Symptomatic transmissions by ILR reveal short nonsustained  atrial tachycardia as the cause Pt reassured today  2. HL Continue statin  3. H/o TIA On ASA and statin No afib by ILR  Risks, benefits and potential toxicities for medications prescribed and/or refilled reviewed with patient today.   Return to see EP PA annually I will see when needed  Thompson Grayer MD, [facility] 02/09/2020 3:22 PM

## 2020-02-10 NOTE — Progress Notes (Signed)
Pt did review labs on my chart. Was called today to verify.

## 2020-02-20 ENCOUNTER — Encounter (INDEPENDENT_AMBULATORY_CARE_PROVIDER_SITE_OTHER): Payer: Self-pay | Admitting: Internal Medicine

## 2020-02-20 DIAGNOSIS — R5381 Other malaise: Secondary | ICD-10-CM

## 2020-02-20 MED ORDER — THYROID 120 MG PO TABS: 120.0000 mg | ORAL_TABLET | Freq: Every day | ORAL | 3 refills | Status: DC

## 2020-02-23 ENCOUNTER — Ambulatory Visit (INDEPENDENT_AMBULATORY_CARE_PROVIDER_SITE_OTHER): Payer: 59

## 2020-02-23 DIAGNOSIS — G459 Transient cerebral ischemic attack, unspecified: Secondary | ICD-10-CM

## 2020-02-25 LAB — CUP PACEART REMOTE DEVICE CHECK
Date Time Interrogation Session: 20211204200722
Implantable Pulse Generator Implant Date: 20210827

## 2020-02-28 ENCOUNTER — Other Ambulatory Visit: Payer: Self-pay | Admitting: Rheumatology

## 2020-03-01 NOTE — Telephone Encounter (Signed)
Last Visit:11/12/2019 Next Visit:05/12/2020  Current Dose per office noteon 11/12/2019:baclofen 10 mg in the morning daily for muscle spasms and muscle tension  Last fill: 01/29/2020  Okay to refill Baclofen?

## 2020-03-03 NOTE — Progress Notes (Signed)
Carelink Summary Report / Loop Recorder 

## 2020-03-09 ENCOUNTER — Other Ambulatory Visit: Payer: Self-pay | Admitting: Obstetrics and Gynecology

## 2020-03-09 DIAGNOSIS — R928 Other abnormal and inconclusive findings on diagnostic imaging of breast: Secondary | ICD-10-CM

## 2020-03-25 ENCOUNTER — Other Ambulatory Visit: Payer: Self-pay

## 2020-03-25 ENCOUNTER — Ambulatory Visit
Admission: RE | Admit: 2020-03-25 | Discharge: 2020-03-25 | Disposition: A | Discharge status: Home / Self Care | Payer: 59 | Source: Ambulatory Visit | Attending: Obstetrics and Gynecology | Admitting: Obstetrics and Gynecology

## 2020-03-25 DIAGNOSIS — R928 Other abnormal and inconclusive findings on diagnostic imaging of breast: Secondary | ICD-10-CM

## 2020-03-28 ENCOUNTER — Other Ambulatory Visit: Payer: Self-pay | Admitting: Physician Assistant

## 2020-03-29 ENCOUNTER — Ambulatory Visit (INDEPENDENT_AMBULATORY_CARE_PROVIDER_SITE_OTHER): Payer: 59

## 2020-03-29 DIAGNOSIS — G459 Transient cerebral ischemic attack, unspecified: Secondary | ICD-10-CM

## 2020-03-29 LAB — CUP PACEART REMOTE DEVICE CHECK
Date Time Interrogation Session: 20220106200903
Implantable Pulse Generator Implant Date: 20210827

## 2020-03-29 NOTE — Telephone Encounter (Signed)
Last Visit: 11/12/2019 Next Visit: 05/12/2020  Current Dose per office note on 11/12/2019, baclofen 10 mg in the morning daily for muscle spasms and muscle tension  Dx: Nodular episcleritis of both eyes: HLA-B27 negative:  Okay to refill Baclofen?

## 2020-04-12 NOTE — Progress Notes (Signed)
Carelink Summary Report / Loop Recorder 

## 2020-04-26 ENCOUNTER — Other Ambulatory Visit: Payer: Self-pay | Admitting: Physician Assistant

## 2020-04-26 ENCOUNTER — Other Ambulatory Visit (INDEPENDENT_AMBULATORY_CARE_PROVIDER_SITE_OTHER): Payer: Self-pay | Admitting: Nurse Practitioner

## 2020-04-26 NOTE — Telephone Encounter (Signed)
Last Visit: 11/12/2019 Next Visit: 05/12/2020  Current Dose per office note on 11/12/2019,  baclofen 10 mg in the morning daily for muscle spasms and muscle tension. Dx: DDD (degenerative disc disease), lumbar:  Last Fill: 03/29/2020  Okay to refill Baclofen?

## 2020-04-28 LAB — CUP PACEART REMOTE DEVICE CHECK
Date Time Interrogation Session: 20220208200446
Implantable Pulse Generator Implant Date: 20210827

## 2020-04-28 NOTE — Progress Notes (Signed)
Office Visit Note  Patient: [name]             Date of Birth: 1965/10/13           MRN: [id number]             PCP: Doree Albee, MD Referring: Doree Albee, MD Visit Date: 05/12/2020 Occupation: '@GUAROCC' @  Subjective:  Right hand pain   History of Present Illness: [name] is a 55 y.o. female with history of osteoarthritis and DDD.  She denies any nodular scleritis flares.  Patient reports that she is due for a routine eye exam soon.  She denies any eye pain, redness, photophobia, blurred vision, floaters, or dryness at this time. Patient presents today with increased pain in her right hand and wrist joint which started about 2 weeks ago.  She describes the pain as an aching sensation.  She denies any numbness at this time.  She has not had any injury prior to the onset of symptoms.  She has not been performing any new overuse activities recently.  She states that she has noticed some intermittent swelling in her right hand.  She has also had decreased grip strength and increased morning stiffness.  She has noticed some increased difficulty opening jars and lids due to the discomfort and weakness.  She has been taking Tylenol arthritis as needed for pain relief.  She continues to have intermittent pain in the right knee as well as her lower back.  She occasionally has warmth and swelling in her right knee and applies ice as needed.  She also uses Voltaren gel topically as needed for pain relief.    Activities of Daily Living:  Patient reports morning stiffness for 10 minutes.   Patient Reports nocturnal pain.  Difficulty dressing/grooming: Denies Difficulty climbing stairs: Denies Difficulty getting out of chair: Reports Difficulty using hands for taps, buttons, cutlery, and/or writing: Reports  Review of Systems  Constitutional: Negative for fatigue.  HENT: Positive for mouth dryness. Negative for mouth sores and nose dryness.   Eyes: Negative for pain,  visual disturbance and dryness.  Respiratory: Negative for cough, hemoptysis, shortness of breath and difficulty breathing.   Cardiovascular: Positive for palpitations. Negative for chest pain, hypertension and swelling in legs/feet.  Gastrointestinal: Negative for blood in stool, constipation and diarrhea.  Endocrine: Negative for increased urination.  Genitourinary: Negative for painful urination.  Musculoskeletal: Positive for arthralgias, joint pain, joint swelling and morning stiffness. Negative for myalgias, muscle weakness, muscle tenderness and myalgias.  Skin: Negative for color change, pallor, rash, hair loss, nodules/bumps, skin tightness, ulcers and sensitivity to sunlight.  Allergic/Immunologic: Negative for susceptible to infections.  Neurological: Negative for dizziness, numbness, headaches and weakness.  Hematological: Negative for swollen glands.  Psychiatric/Behavioral: Negative for depressed mood and sleep disturbance. The patient is not nervous/anxious.     PMFS History:  Patient Active Problem List   Diagnosis Date Noted  . Palpitation 04/22/2019  . Fatigue 04/22/2019  . Menopause 04/22/2019  . Vitamin D deficiency disease 11/28/2018  . Abdominal pain 03/21/2018  . Nausea with vomiting 03/21/2018  . Primary osteoarthritis of both knees 09/24/2017  . Osteoarthritis of both knees 01/19/2016  . Osteoarthritis of both feet 01/19/2016  . History of TIA (transient ischemic attack) 01/19/2016  . DDD (degenerative disc disease), lumbar 01/19/2016  . Nodular episcleritis of both eyes 01/19/2016  . Pain in right knee 01/19/2016  . Leiomyoma 10/12/2011  . Female pelvic pain 10/12/2011  Past Medical History:  Diagnosis Date  . DDD (degenerative disc disease), lumbar 01/19/2016  . History of TIA (transient ischemic attack) 01/19/2016  . Hypothyroidism   . Osteoarthritis of both feet 01/19/2016  . Osteoarthritis of both knees 01/19/2016  . PONV (postoperative nausea and  vomiting)   . Stroke [facility]) 07/2010   "TIA" per patient  . Stroke [facility]) 2018   TIA  . Vitamin D deficiency disease 11/28/2018    Family History  Problem Relation Age of Onset  . Anuerysm Mother   . Heart attack Mother   . Heart disease Mother   . Diabetes Father   . Hypertension Father   . Diabetes Sister   . Osteoarthritis Sister   . Colon cancer Paternal Uncle    Past Surgical History:  Procedure Laterality Date  . ABDOMINAL HYSTERECTOMY  2013   1 OVARY LEFT-BENIGN TUMOR  . BIOPSY  05/15/2018   Procedure: BIOPSY;  Surgeon: Daneil Dolin, MD;  [facility];  Service: Endoscopy;;  polyp cb  . CHOLECYSTECTOMY    . COLONOSCOPY N/A 05/15/2018   Procedure: COLONOSCOPY;  Surgeon: Daneil Dolin, MD;  [facility];  Service: Endoscopy;  Laterality: N/A;  12:00pm  . EYE SURGERY    . implantable loop recorder placement  11/15/19   Medtronic Reveal Linq model LNQ 22 (SN [id number])  implantable loop recorder by Dr Rayann Heman  . KNEE ARTHROSCOPY     x2  . LAPAROSCOPIC ASSISTED VAGINAL HYSTERECTOMY  10/11/2011   Procedure: LAPAROSCOPIC ASSISTED VAGINAL HYSTERECTOMY;  Surgeon: Margarette Asal, MD;  Location: [facility];  Service: Gynecology;  Laterality: N/A;  . LASIK    . POLYPECTOMY  05/15/2018   Procedure: POLYPECTOMY;  Surgeon: Daneil Dolin, MD;  [facility];  Service: Endoscopy;;  colon    Social History   Social History Narrative   Lives with husband of 2 years(second)  and son/family.Works in [employer] for a long time. Has 32 son 45 years old. Divorced x 1(previously married for 25 yrs), remarried <1. Has 2 dogs.   Immunization History  Administered Date(s) Administered  . Influenza Inj Mdck Quad Pf 01/26/2020  . Influenza,inj,Quad PF,6+ Mos 02/27/2019  . Influenza-Unspecified 02/27/2019  . Tdap 08/24/2017     Objective: Vital Signs: BP 114/73 (BP Location: Left Arm, Patient Position: Sitting, Cuff Size: Normal)   Pulse 78   Resp 15    Ht '5\' 2"'  (1.575 m)   Wt 175 lb 6.4 oz (79.6 kg)   LMP 01/09/2011   BMI 32.08 kg/m    Physical Exam Vitals and nursing note reviewed.  Constitutional:      Appearance: She is well-developed and well-nourished.  HENT:     Head: Normocephalic and atraumatic.  Eyes:     Extraocular Movements: EOM normal.     Conjunctiva/sclera: Conjunctivae normal.  Cardiovascular:     Pulses: Intact distal pulses.  Pulmonary:     Effort: Pulmonary effort is normal.  Abdominal:     Palpations: Abdomen is soft.  Musculoskeletal:     Cervical back: Normal range of motion.  Skin:    General: Skin is warm and dry.     Capillary Refill: Capillary refill takes less than 2 seconds.  Neurological:     Mental Status: She is alert and oriented to person, place, and time.  Psychiatric:        Mood and Affect: Mood and affect normal.        Behavior: Behavior  normal.      Musculoskeletal Exam: C-spine, thoracic spine, lumbar spine have good range of motion with no discomfort.  No midline spinal tenderness.  Some tenderness over the right SI joint.  Shoulder joints, elbow joints, wrist joints, MCPs, PIPs, DIPs have good range of motion with no synovitis.  She has DIP prominence in both hands.  No tenderness or synovitis over MCP joints.  She is able to make a complete fist bilaterally.  Hip joints have good range of motion with no discomfort.  No tenderness over trochanteric bursa bilaterally.  Knee joints have good range of motion with some discomfort in the right knee.  Warmth of the right knee but no effusion noted.  Ankle joints have good range of motion with no tenderness or inflammation.  CDAI Exam: CDAI Score: -- Patient Global: --; Provider Global: -- Swollen: --; Tender: -- Joint Exam 05/12/2020   No joint exam has been documented for this visit   There is currently no information documented on the homunculus. Go to the Rheumatology activity and complete the homunculus joint  exam.  Investigation: No additional findings.  Imaging: CUP PACEART REMOTE DEVICE CHECK  Result Date: 04/28/2020 ILR summary report received. Battery status OK. Normal device function. No new tachy, brady, or pause episodes. No new AF episodes. There was one symptom event detected that was SR with PACs.   Monthly summary reports and ROV/PRN Kathy Breach, RN, CCDS, CV Remote   Recent Labs: Lab Results  Component Value Date   WBC 6.8 01/26/2020   HGB 13.4 01/26/2020   PLT 280 01/26/2020   NA 144 01/26/2020   K 4.1 01/26/2020   CL 104 01/26/2020   CO2 32 01/26/2020   GLUCOSE 101 (H) 01/26/2020   BUN 16 01/26/2020   CREATININE 0.56 01/26/2020   BILITOT 1.0 01/26/2020   ALKPHOS 50 07/13/2016   AST 11 01/26/2020   ALT 10 01/26/2020   PROT 7.0 01/26/2020   ALBUMIN 3.8 07/13/2016   CALCIUM 9.8 01/26/2020   GFRAA 124 01/26/2020    Speciality Comments: No specialty comments available.  Procedures:  No procedures performed Allergies: Celebrex [celecoxib]   Assessment / Plan:     Visit Diagnoses: Nodular episcleritis of both eyes: She has not had any signs or symptoms of a flare recently.  She is not experiencing any eye pain, blurred vision, photophobia, conjunctival injection, or eye dryness.  She is due for her routine ophthalmology exam and was encouraged to schedule appointment as soon as possible.  She was advised to notify us if she develops signs or symptoms of a flare.  Pain in both hands -She presents today with increased pain in the right hand which started about 2 weeks ago.  She has not had any injury or new onset overuse activities recently.  She has been experiencing increased morning stiffness and intermittent swelling in her right hand and wrist.  She has not had any nocturnal pain.  Her discomfort is exacerbated by typing on a daily basis for work.  She takes Tylenol arthritis as needed for pain relief.  She has started to notice some decreased grip strength in her  right hand.  X-rays of both hands were obtained today.  We will also obtain the following lab work.  She does not have any known family history of rheumatoid arthritis.  We discussed the importance of joint protection and muscle strengthening.  She was given a handout of hand exercises to perform.  We discussed the use  of natural anti-inflammatories and she plans on adding omega-3 and ginger to her regimen.  We also discussed the use of arthritis compression gloves.  We discussed that if her hand pain persists or worsens we will schedule an ultrasound to assess for synovitis.  Plan: XR Hand 2 View Right, XR Hand 2 View Left, 14-3-3 eta Protein, Cyclic citrul peptide antibody, IgG, Rheumatoid factor, Sedimentation rate, C-reactive protein  Primary osteoarthritis of both knees: She has good range of motion of both knee joints on examination today.  She experiences intermittent pain in the right knee.  She has warmth but no effusion on examination today.  We discussed the use of Voltaren gel which she can apply topically as needed for pain relief.  Primary osteoarthritis of both feet: She is not experiencing any discomfort in her feet at this time.  She wears proper fitting shoes.  DDD (degenerative disc disease), lumbar - She has chronic lower back pain.  She takes baclofen 10 mg in the morning daily for muscle spasms and muscle tension and takes Tylenol arthritis as needed for pain relief.  She had no midline spinal tenderness on examination today.  She has some tenderness over the right SI joint.  Piriformis syndrome, right: Resolved  Chronic right SI joint pain - HLA-B27 negative: She continues to have chronic right SI joint pain.  She has tenderness palpation on exam.  Chronic left SI joint pain: No left SI joint tenderness noted.   Other medical conditions are listed as follows:  History of cholecystectomy  History of TIA (transient ischemic attack)    Orders: Orders Placed This Encounter   Procedures  . XR Hand 2 View Right  . XR Hand 2 View Left  . 14-3-3 eta Protein  . Cyclic citrul peptide antibody, IgG  . Rheumatoid factor  . Sedimentation rate  . C-reactive protein   No orders of the defined types were placed in this encounter.     Follow-Up Instructions: Return in about 2 months (around 07/10/2020) for Osteoarthritis, DDD.   Ofilia Neas, PA-C  Note - This record has been created using Dragon software.  Chart creation errors have been sought, but may not always  have been located. Such creation errors do not reflect on  the standard of medical care.

## 2020-04-29 ENCOUNTER — Encounter (INDEPENDENT_AMBULATORY_CARE_PROVIDER_SITE_OTHER): Payer: Self-pay

## 2020-04-29 ENCOUNTER — Ambulatory Visit (INDEPENDENT_AMBULATORY_CARE_PROVIDER_SITE_OTHER): Payer: 59 | Admitting: Internal Medicine

## 2020-05-03 ENCOUNTER — Ambulatory Visit (INDEPENDENT_AMBULATORY_CARE_PROVIDER_SITE_OTHER): Payer: 59

## 2020-05-03 DIAGNOSIS — G459 Transient cerebral ischemic attack, unspecified: Secondary | ICD-10-CM | POA: Diagnosis not present

## 2020-05-07 NOTE — Progress Notes (Signed)
Carelink Summary Report / Loop Recorder 

## 2020-05-12 ENCOUNTER — Ambulatory Visit: Payer: 59 | Admitting: Physician Assistant

## 2020-05-12 ENCOUNTER — Encounter: Payer: Self-pay | Admitting: Physician Assistant

## 2020-05-12 ENCOUNTER — Other Ambulatory Visit: Payer: Self-pay

## 2020-05-12 ENCOUNTER — Ambulatory Visit: Payer: Self-pay

## 2020-05-12 VITALS — BP 114/73 | HR 78 | Resp 15 | Ht 62.0 in | Wt 175.4 lb

## 2020-05-12 DIAGNOSIS — M5136 Other intervertebral disc degeneration, lumbar region: Secondary | ICD-10-CM | POA: Diagnosis not present

## 2020-05-12 DIAGNOSIS — M79641 Pain in right hand: Secondary | ICD-10-CM | POA: Diagnosis not present

## 2020-05-12 DIAGNOSIS — H15123 Nodular episcleritis, bilateral: Secondary | ICD-10-CM

## 2020-05-12 DIAGNOSIS — M79642 Pain in left hand: Secondary | ICD-10-CM | POA: Diagnosis not present

## 2020-05-12 DIAGNOSIS — G8929 Other chronic pain: Secondary | ICD-10-CM

## 2020-05-12 DIAGNOSIS — M170 Bilateral primary osteoarthritis of knee: Secondary | ICD-10-CM | POA: Diagnosis not present

## 2020-05-12 DIAGNOSIS — M533 Sacrococcygeal disorders, not elsewhere classified: Secondary | ICD-10-CM

## 2020-05-12 DIAGNOSIS — M19071 Primary osteoarthritis, right ankle and foot: Secondary | ICD-10-CM

## 2020-05-12 DIAGNOSIS — Z8673 Personal history of transient ischemic attack (TIA), and cerebral infarction without residual deficits: Secondary | ICD-10-CM

## 2020-05-12 DIAGNOSIS — Z9049 Acquired absence of other specified parts of digestive tract: Secondary | ICD-10-CM

## 2020-05-12 DIAGNOSIS — G5701 Lesion of sciatic nerve, right lower limb: Secondary | ICD-10-CM

## 2020-05-12 DIAGNOSIS — M19072 Primary osteoarthritis, left ankle and foot: Secondary | ICD-10-CM

## 2020-05-12 NOTE — Progress Notes (Signed)
I called the patient to review x-ray results.  All questions were addressed.  We will notify her once her lab work has resulted.

## 2020-05-12 NOTE — Patient Instructions (Signed)
Hand Exercises Hand exercises can be helpful for almost anyone. These exercises can strengthen the hands, improve flexibility and movement, and increase blood flow to the hands. These results can make work and daily tasks easier. Hand exercises can be especially helpful for people who have joint pain from arthritis or have nerve damage from overuse (carpal tunnel syndrome). These exercises can also help people who have injured a hand. Exercises Most of these hand exercises are gentle stretching and motion exercises. It is usually safe to do them often throughout the day. Warming up your hands before exercise may help to reduce stiffness. You can do this with gentle massage or by placing your hands in warm water for 10-15 minutes. It is normal to feel some stretching, pulling, tightness, or mild discomfort as you begin new exercises. This will gradually improve. Stop an exercise right away if you feel sudden, severe pain or your pain gets worse. Ask your health care provider which exercises are best for you. Knuckle bend or "claw" fist 1. Stand or sit with your arm, hand, and all five fingers pointed straight up. Make sure to keep your wrist straight during the exercise. 2. Gently bend your fingers down toward your palm until the tips of your fingers are touching the top of your palm. Keep your big knuckle straight and just bend the small knuckles in your fingers. 3. Hold this position for __________ seconds. 4. Straighten (extend) your fingers back to the starting position. Repeat this exercise 5-10 times with each hand. Full finger fist 1. Stand or sit with your arm, hand, and all five fingers pointed straight up. Make sure to keep your wrist straight during the exercise. 2. Gently bend your fingers into your palm until the tips of your fingers are touching the middle of your palm. 3. Hold this position for __________ seconds. 4. Extend your fingers back to the starting position, stretching every  joint fully. Repeat this exercise 5-10 times with each hand. Straight fist 1. Stand or sit with your arm, hand, and all five fingers pointed straight up. Make sure to keep your wrist straight during the exercise. 2. Gently bend your fingers at the big knuckle, where your fingers meet your hand, and the middle knuckle. Keep the knuckle at the tips of your fingers straight and try to touch the bottom of your palm. 3. Hold this position for __________ seconds. 4. Extend your fingers back to the starting position, stretching every joint fully. Repeat this exercise 5-10 times with each hand. Tabletop 1. Stand or sit with your arm, hand, and all five fingers pointed straight up. Make sure to keep your wrist straight during the exercise. 2. Gently bend your fingers at the big knuckle, where your fingers meet your hand, as far down as you can while keeping the small knuckles in your fingers straight. Think of forming a tabletop with your fingers. 3. Hold this position for __________ seconds. 4. Extend your fingers back to the starting position, stretching every joint fully. Repeat this exercise 5-10 times with each hand. Finger spread 1. Place your hand flat on a table with your palm facing down. Make sure your wrist stays straight as you do this exercise. 2. Spread your fingers and thumb apart from each other as far as you can until you feel a gentle stretch. Hold this position for __________ seconds. 3. Bring your fingers and thumb tight together again. Hold this position for __________ seconds. Repeat this exercise 5-10 times with each hand.   Making circles 1. Stand or sit with your arm, hand, and all five fingers pointed straight up. Make sure to keep your wrist straight during the exercise. 2. Make a circle by touching the tip of your thumb to the tip of your index finger. 3. Hold for __________ seconds. Then open your hand wide. 4. Repeat this motion with your thumb and each finger on your  hand. Repeat this exercise 5-10 times with each hand. Thumb motion 1. Sit with your forearm resting on a table and your wrist straight. Your thumb should be facing up toward the ceiling. Keep your fingers relaxed as you move your thumb. 2. Lift your thumb up as high as you can toward the ceiling. Hold for __________ seconds. 3. Bend your thumb across your palm as far as you can, reaching the tip of your thumb for the small finger (pinkie) side of your palm. Hold for __________ seconds. Repeat this exercise 5-10 times with each hand. Grip strengthening 1. Hold a stress ball or other soft ball in the middle of your hand. 2. Slowly increase the pressure, squeezing the ball as much as you can without causing pain. Think of bringing the tips of your fingers into the middle of your palm. All of your finger joints should bend when doing this exercise. 3. Hold your squeeze for __________ seconds, then relax. Repeat this exercise 5-10 times with each hand.   Contact a health care provider if:  Your hand pain or discomfort gets much worse when you do an exercise.  Your hand pain or discomfort does not improve within 2 hours after you exercise. If you have any of these problems, stop doing these exercises right away. Do not do them again unless your health care provider says that you can. Get help right away if:  You develop sudden, severe hand pain or swelling. If this happens, stop doing these exercises right away. Do not do them again unless your health care provider says that you can. This information is not intended to replace advice given to you by your health care provider. Make sure you discuss any questions you have with your health care provider. Document Revised: 06/27/2018 Document Reviewed: 03/07/2018 Elsevier Patient Education  2021 [employer].  

## 2020-05-13 NOTE — Progress Notes (Signed)
RF and anti-CCP negative.  ESR and CRP WNL. 14-3-3 eta pending.

## 2020-05-17 ENCOUNTER — Ambulatory Visit (INDEPENDENT_AMBULATORY_CARE_PROVIDER_SITE_OTHER): Payer: 59 | Admitting: Internal Medicine

## 2020-05-19 LAB — 14-3-3 ETA PROTEIN: 14-3-3 eta Protein: <0.2 ng/mL (ref <0.2)

## 2020-05-19 LAB — RHEUMATOID FACTOR: Rheumatoid fact SerPl-aCnc: <14 IU/mL (ref <14)

## 2020-05-19 LAB — CYCLIC CITRUL PEPTIDE ANTIBODY, IGG: Cyclic Citrullin Peptide Ab: <16 UNITS

## 2020-05-19 LAB — SEDIMENTATION RATE: Sed Rate: 6 mm/h (ref 0–30)

## 2020-05-19 LAB — C-REACTIVE PROTEIN: CRP: 2.6 mg/L (ref <8.0)

## 2020-05-20 NOTE — Progress Notes (Signed)
14-3-3 eta negative.

## 2020-05-24 ENCOUNTER — Other Ambulatory Visit: Payer: Self-pay

## 2020-05-24 ENCOUNTER — Ambulatory Visit (INDEPENDENT_AMBULATORY_CARE_PROVIDER_SITE_OTHER): Payer: 59 | Admitting: Internal Medicine

## 2020-05-24 ENCOUNTER — Other Ambulatory Visit (INDEPENDENT_AMBULATORY_CARE_PROVIDER_SITE_OTHER): Payer: Self-pay | Admitting: Internal Medicine

## 2020-05-24 ENCOUNTER — Encounter (INDEPENDENT_AMBULATORY_CARE_PROVIDER_SITE_OTHER): Payer: Self-pay | Admitting: Internal Medicine

## 2020-05-24 VITALS — HR 100 | Temp 98.2°F | Ht 62.0 in

## 2020-05-24 DIAGNOSIS — F428 Other obsessive-compulsive disorder: Secondary | ICD-10-CM | POA: Diagnosis not present

## 2020-05-24 DIAGNOSIS — Z630 Problems in relationship with spouse or partner: Secondary | ICD-10-CM | POA: Diagnosis not present

## 2020-05-24 DIAGNOSIS — A64 Unspecified sexually transmitted disease: Secondary | ICD-10-CM

## 2020-05-24 MED ORDER — ESCITALOPRAM OXALATE 5 MG PO TABS: 5.0000 mg | ORAL_TABLET | Freq: Every day | ORAL | 3 refills | Status: DC

## 2020-05-24 MED ORDER — LORAZEPAM 0.5 MG PO TABS: 0.5000 mg | ORAL_TABLET | Freq: Two times a day (BID) | ORAL | 1 refills | Status: DC | PRN

## 2020-05-24 NOTE — Progress Notes (Signed)
Metrics: Intervention Frequency ACO  Documented Smoking Status Yearly  Screened one or more times in 24 months  Cessation Counseling or  Active cessation medication Past 24 months  Past 24 months   Guideline developer: UpToDate (See UpToDate for funding source) Date Released: 2014       Wellness Office Visit  Subjective:  Patient ID: [name], female    DOB: [birth year]  Age: 55 y.o. MRN: [id number]  CC: This appointment was follow-up appointment but the patient came in extremely upset because her husband has been having homosexual encounters over a span of many months and possibly years. HPI  Apparently, the patient confronted her husband last week at which point he admitted the homosexual encounters he has had.  She is extremely upset and constantly ruminates. She denies having any suicidal ideation.  She cannot focus on her work and has to leave early every day. Past Medical History:  Diagnosis Date  . DDD (degenerative disc disease), lumbar 01/19/2016  . History of TIA (transient ischemic attack) 01/19/2016  . Hypothyroidism   . Osteoarthritis of both feet 01/19/2016  . Osteoarthritis of both knees 01/19/2016  . PONV (postoperative nausea and vomiting)   . Stroke [facility]) 07/2010   "TIA" per patient  . Stroke [facility]) 2018   TIA  . Vitamin D deficiency disease 11/28/2018   Past Surgical History:  Procedure Laterality Date  . ABDOMINAL HYSTERECTOMY  2013   1 OVARY LEFT-BENIGN TUMOR  . BIOPSY  05/15/2018   Procedure: BIOPSY;  Surgeon: Daneil Dolin, MD;  [facility];  Service: Endoscopy;;  polyp cb  . CHOLECYSTECTOMY    . COLONOSCOPY N/A 05/15/2018   Procedure: COLONOSCOPY;  Surgeon: Daneil Dolin, MD;  [facility];  Service: Endoscopy;  Laterality: N/A;  12:00pm  . EYE SURGERY    . implantable loop recorder placement  11/15/19   Medtronic Reveal Linq model LNQ 22 (SN [id number])  implantable loop recorder by Dr Rayann Heman  . KNEE ARTHROSCOPY     x2  .  LAPAROSCOPIC ASSISTED VAGINAL HYSTERECTOMY  10/11/2011   Procedure: LAPAROSCOPIC ASSISTED VAGINAL HYSTERECTOMY;  Surgeon: Margarette Asal, MD;  Location: [facility];  Service: Gynecology;  Laterality: N/A;  . LASIK    . POLYPECTOMY  05/15/2018   Procedure: POLYPECTOMY;  Surgeon: Daneil Dolin, MD;  [facility];  Service: Endoscopy;;  colon      Family History  Problem Relation Age of Onset  . Anuerysm Mother   . Heart attack Mother   . Heart disease Mother   . Diabetes Father   . Hypertension Father   . Diabetes Sister   . Osteoarthritis Sister   . Colon cancer Paternal Uncle     Social History   Social History Narrative   Lives with husband of 2 years(second)  and son/family.Works in [employer] for a long time. Has 31 son 26 years old. Divorced x 1(previously married for 25 yrs), remarried <1. Has 2 dogs.   Social History   Tobacco Use  . Smoking status: Never Smoker  . Smokeless tobacco: Never Used  Substance Use Topics  . Alcohol use: Yes    Comment: occasionally    Current Meds  Medication Sig  . aspirin 325 MG EC tablet Take 325 mg by mouth daily.  . baclofen (LIORESAL) 10 MG tablet TAKE 1 TABLET(10 MG) BY MOUTH DAILY AS NEEDED FOR MUSCLE SPASMS  . Cholecalciferol (VITAMIN D-3) 125 MCG (5000 UT) TABS Take 10,000  Units by mouth daily at 12 noon.  . escitalopram (LEXAPRO) 5 MG tablet Take 1 tablet (5 mg total) by mouth daily.  Marland Kitchen estradiol (ESTRACE) 0.5 MG tablet TAKE 1 TABLET(0.5 MG) BY MOUTH DAILY  . LORazepam (ATIVAN) 0.5 MG tablet Take 1 tablet (0.5 mg total) by mouth 2 (two) times daily as needed for anxiety.  . Misc Natural Products (TART CHERRY ADVANCED PO) Take 1 capsule by mouth daily.   . pravastatin (PRAVACHOL) 10 MG tablet TAKE 1 TABLET BY MOUTH EVERY EVENING  . progesterone (PROMETRIUM) 200 MG capsule Take 2 capsules (400 mg total) by mouth daily.  . Testosterone Propionate (FIRST-TESTOSTERONE MC) 2 % CREA Place 5 mg onto the skin daily.   Marland Kitchen thyroid (NP THYROID) 120 MG tablet Take 1 tablet (120 mg total) by mouth daily before breakfast.  . TURMERIC PO Take 1 capsule by mouth daily.     [place] Office Visit from 05/24/2020 in [facility]  PHQ-9 Total Score 15      Objective:   Today's Vitals: BP 138/80   Pulse 100   Temp 98.2 F (36.8 C) (Temporal)   Ht 5\' 2"  (1.575 m)   LMP 01/09/2011   SpO2 98%   BMI 32.08 kg/m  Vitals with BMI 05/24/2020 05/12/2020 02/09/2020  Height 5\' 2"  5\' 2"  5\' 2"   Weight - 175 lbs 6 oz 171 lbs 3 oz  BMI - 84.69 62.95  Systolic 284 132 440  Diastolic 80 73 66  Pulse 102 78 65     Physical Exam  She looks systemically well but is very tearful.     Assessment   1. Relationship problem between partners   2. Psychogenic rumination   3. Sexually transmitted disease       Tests ordered Orders Placed This Encounter  Procedures  . HIV Antibody (routine testing w rflx)  . Hepatitis panel, acute  . Ambulatory referral to Psychiatry     Plan: 1. We will get blood and urine for investigation for sexually transmitted infection. 2. I will refer her to psychiatry/therapy. 3. I will start her on Lexapro and judicious use of lorazepam as needed for the time being. 4. Follow-up in 1 month.  I spent 30 minutes with this patient discussing above.   Meds ordered this encounter  Medications  . escitalopram (LEXAPRO) 5 MG tablet    Sig: Take 1 tablet (5 mg total) by mouth daily.    Dispense:  30 tablet    Refill:  3  . LORazepam (ATIVAN) 0.5 MG tablet    Sig: Take 1 tablet (0.5 mg total) by mouth 2 (two) times daily as needed for anxiety.    Dispense:  30 tablet    Refill:  1    Nimish Luther Parody, MD

## 2020-05-26 LAB — HSV(HERPES SIMPLEX VRS) I + II AB-IGG
HAV 1 IGG,TYPE SPECIFIC AB: 34 index — ABNORMAL HIGH
HSV 2 IGG,TYPE SPECIFIC AB: 5.77 index — ABNORMAL HIGH

## 2020-05-26 LAB — HEPATITIS PANEL, ACUTE
Hep A IgM: NONREACTIVE
Hep B C IgM: NONREACTIVE
Hepatitis B Surface Ag: NONREACTIVE
Hepatitis C Ab: NONREACTIVE
SIGNAL TO CUT-OFF: 0.01 (ref <1.00)

## 2020-05-26 LAB — HIV ANTIBODY (ROUTINE TESTING W REFLEX): HIV 1&2 Ab, 4th Generation: NONREACTIVE

## 2020-05-26 LAB — C. TRACHOMATIS/N. GONORRHOEAE RNA
C. trachomatis RNA, TMA: NOT DETECTED
N. gonorrhoeae RNA, TMA: NOT DETECTED

## 2020-05-27 ENCOUNTER — Encounter (INDEPENDENT_AMBULATORY_CARE_PROVIDER_SITE_OTHER): Payer: Self-pay | Admitting: Internal Medicine

## 2020-05-27 ENCOUNTER — Ambulatory Visit (INDEPENDENT_AMBULATORY_CARE_PROVIDER_SITE_OTHER): Payer: 59 | Admitting: Internal Medicine

## 2020-05-31 NOTE — Progress Notes (Signed)
Return call to the pt on the result of lab work. Pt said she was at work; she will check later. Ask her to reply back with any questions or concerns. Pt sated she would.

## 2020-05-31 NOTE — Progress Notes (Signed)
Please call the patient and let her know that I looked wrote on my chart message regarding all her blood work.  Thanks.

## 2020-06-03 LAB — CUP PACEART REMOTE DEVICE CHECK
Date Time Interrogation Session: 20220313200931
Implantable Pulse Generator Implant Date: 20210827

## 2020-06-07 ENCOUNTER — Ambulatory Visit (INDEPENDENT_AMBULATORY_CARE_PROVIDER_SITE_OTHER): Payer: 59

## 2020-06-07 DIAGNOSIS — G459 Transient cerebral ischemic attack, unspecified: Secondary | ICD-10-CM | POA: Diagnosis not present

## 2020-06-15 NOTE — Progress Notes (Signed)
Carelink Summary Report / Loop Recorder 

## 2020-06-18 ENCOUNTER — Other Ambulatory Visit (INDEPENDENT_AMBULATORY_CARE_PROVIDER_SITE_OTHER): Payer: Self-pay | Admitting: Internal Medicine

## 2020-06-18 DIAGNOSIS — R5383 Other fatigue: Secondary | ICD-10-CM

## 2020-06-18 DIAGNOSIS — R5381 Other malaise: Secondary | ICD-10-CM

## 2020-06-22 ENCOUNTER — Encounter (INDEPENDENT_AMBULATORY_CARE_PROVIDER_SITE_OTHER): Payer: Self-pay | Admitting: Internal Medicine

## 2020-06-22 ENCOUNTER — Ambulatory Visit (INDEPENDENT_AMBULATORY_CARE_PROVIDER_SITE_OTHER): Payer: 59 | Admitting: Internal Medicine

## 2020-06-22 ENCOUNTER — Other Ambulatory Visit: Payer: Self-pay

## 2020-06-22 VITALS — BP 100/68 | HR 66 | Temp 97.7°F | Ht 62.0 in | Wt 167.8 lb

## 2020-06-22 DIAGNOSIS — N951 Menopausal and female climacteric states: Secondary | ICD-10-CM | POA: Diagnosis not present

## 2020-06-22 DIAGNOSIS — Z630 Problems in relationship with spouse or partner: Secondary | ICD-10-CM

## 2020-06-22 DIAGNOSIS — F520 Hypoactive sexual desire disorder: Secondary | ICD-10-CM

## 2020-06-22 DIAGNOSIS — F428 Other obsessive-compulsive disorder: Secondary | ICD-10-CM

## 2020-06-22 MED ORDER — ESCITALOPRAM OXALATE 10 MG PO TABS: 10.0000 mg | ORAL_TABLET | Freq: Every day | ORAL | 3 refills | Status: DC

## 2020-06-22 NOTE — Progress Notes (Signed)
Metrics: Intervention Frequency ACO  Documented Smoking Status Yearly  Screened one or more times in 24 months  Cessation Counseling or  Active cessation medication Past 24 months  Past 24 months   Guideline developer: UpToDate (See UpToDate for funding source) Date Released: 2014       Wellness Office Visit  Subjective:  Patient ID: [name], female    DOB: 04/05/1965  Age: 55 y.o. MRN: [id number]  CC: This lady comes in for follow-up regarding bioidentical hormone therapy as well as recent stress involving her relationship with her husband. HPI  She continues to ruminate and has tolerated taking Lexapro 5 mg daily.  She has not used lorazepam very frequently.  She says that she thinks she may be making some mistakes at work and finds it difficult to finish the whole day sometimes.  She is still living with her husband but it appears they are in separate rooms. She continues to take estradiol 0.5 mg daily and progesterone for 100 mg at night.  She continues on testosterone therapy, cream, applied 5 mg daily to the labia. Past Medical History:  Diagnosis Date  . DDD (degenerative disc disease), lumbar 01/19/2016  . History of TIA (transient ischemic attack) 01/19/2016  . Hypothyroidism   . Osteoarthritis of both feet 01/19/2016  . Osteoarthritis of both knees 01/19/2016  . PONV (postoperative nausea and vomiting)   . Stroke [facility]) 07/2010   "TIA" per patient  . Stroke [facility]) 2018   TIA  . Vitamin D deficiency disease 11/28/2018   Past Surgical History:  Procedure Laterality Date  . ABDOMINAL HYSTERECTOMY  2013   1 OVARY LEFT-BENIGN TUMOR  . BIOPSY  05/15/2018   Procedure: BIOPSY;  Surgeon: Daneil Dolin, MD;  [facility];  Service: Endoscopy;;  polyp cb  . CHOLECYSTECTOMY    . COLONOSCOPY N/A 05/15/2018   Procedure: COLONOSCOPY;  Surgeon: Daneil Dolin, MD;  [facility];  Service: Endoscopy;  Laterality: N/A;  12:00pm  . EYE SURGERY    .  implantable loop recorder placement  11/15/19   Medtronic Reveal Linq model LNQ 22 (SN [id number])  implantable loop recorder by Dr Rayann Heman  . KNEE ARTHROSCOPY     x2  . LAPAROSCOPIC ASSISTED VAGINAL HYSTERECTOMY  10/11/2011   Procedure: LAPAROSCOPIC ASSISTED VAGINAL HYSTERECTOMY;  Surgeon: Margarette Asal, MD;  Location: [facility];  Service: Gynecology;  Laterality: N/A;  . LASIK    . POLYPECTOMY  05/15/2018   Procedure: POLYPECTOMY;  Surgeon: Daneil Dolin, MD;  [facility];  Service: Endoscopy;;  colon      Family History  Problem Relation Age of Onset  . Anuerysm Mother   . Heart attack Mother   . Heart disease Mother   . Diabetes Father   . Hypertension Father   . Diabetes Sister   . Osteoarthritis Sister   . Colon cancer Paternal Uncle     Social History   Social History Narrative   Lives with husband of 2 years(second)  and son/family.Works in [employer] for a long time. Has 38 son 22 years old. Divorced x 1(previously married for 25 yrs), remarried <1. Has 2 dogs.   Social History   Tobacco Use  . Smoking status: Never Smoker  . Smokeless tobacco: Never Used  Substance Use Topics  . Alcohol use: Yes    Comment: occasionally    Current Meds  Medication Sig  . aspirin 325 MG EC tablet Take 325 mg  by mouth daily.  . baclofen (LIORESAL) 10 MG tablet TAKE 1 TABLET(10 MG) BY MOUTH DAILY AS NEEDED FOR MUSCLE SPASMS  . Cholecalciferol (VITAMIN D-3) 125 MCG (5000 UT) TABS Take 10,000 Units by mouth daily at 12 noon.  Marland Kitchen estradiol (ESTRACE) 0.5 MG tablet TAKE 1 TABLET(0.5 MG) BY MOUTH DAILY  . LORazepam (ATIVAN) 0.5 MG tablet Take 1 tablet (0.5 mg total) by mouth 2 (two) times daily as needed for anxiety.  . Misc Natural Products (TART CHERRY ADVANCED PO) Take 1 capsule by mouth daily.   . NP THYROID 120 MG tablet TAKE 1 TABLET(120 MG) BY MOUTH DAILY BEFORE BREAKFAST  . pravastatin (PRAVACHOL) 10 MG tablet TAKE 1 TABLET BY MOUTH EVERY EVENING  .  progesterone (PROMETRIUM) 200 MG capsule Take 2 capsules (400 mg total) by mouth daily.  . Testosterone Propionate (FIRST-TESTOSTERONE MC) 2 % CREA Place 5 mg onto the skin daily.  . TURMERIC PO Take 1 capsule by mouth daily.  . [DISCONTINUED] escitalopram (LEXAPRO) 5 MG tablet Take 1 tablet (5 mg total) by mouth daily.  Marland Kitchen escitalopram (LEXAPRO) 10 MG tablet Take 1 tablet (10 mg total) by mouth daily.     [place] Office Visit from 05/24/2020 in [facility]  PHQ-9 Total Score 15      Objective:   Today's Vitals: BP 100/68   Pulse 66   Temp 97.7 F (36.5 C) (Temporal)   Ht 5\' 2"  (1.575 m)   Wt 167 lb 12.8 oz (76.1 kg)   LMP 01/09/2011   SpO2 99%   BMI 30.69 kg/m  Vitals with BMI 06/22/2020 05/24/2020 05/12/2020  Height 5\' 2"  5\' 2"  5\' 2"   Weight 167 lbs 13 oz (No Data) 175 lbs 6 oz  BMI 41.32 - 44.01  Systolic 027 (No Data) 253  Diastolic 68 (No Data) 73  Pulse 66 100 78     Physical Exam  She remains tearful.  Sad affect.     Assessment   1. Relationship problem between partners   2. Psychogenic rumination   3. Hot flashes due to menopause   4. Hypoactive sexual desire disorder       Tests ordered Orders Placed This Encounter  Procedures  . Estradiol  . Progesterone  . Testos,Total,Free and SHBG (Female)     Plan: 1. I am going to increase her Lexapro to 10 mg daily.  Thankfully, she is seeing a therapist this week. 2. Continue with estradiol, progesterone and testosterone and we will check levels today. 3. I will see her in a couple of months to see how she is doing.   Meds ordered this encounter  Medications  . escitalopram (LEXAPRO) 10 MG tablet    Sig: Take 1 tablet (10 mg total) by mouth daily.    Dispense:  30 tablet    Refill:  3    Anastasio Wogan Luther Parody, MD

## 2020-06-25 NOTE — Progress Notes (Deleted)
Office Visit Note  Patient: [name]             Date of Birth: [birth year]           MRN: [id number]             PCP: Doree Albee, MD Referring: Doree Albee, MD Visit Date: 07/07/2020 Occupation: @GUAROCC @  Subjective:    History of Present Illness: [name] is a 55 y.o. female nodular scleritis, osteoarthritis, and DDD.  Activities of Daily Living:  Patient reports morning stiffness for *** {minute/hour:19697}.   Patient {ACTIONS;DENIES/REPORTS:21021675::"Denies"} nocturnal pain.  Difficulty dressing/grooming: {ACTIONS;DENIES/REPORTS:21021675::"Denies"} Difficulty climbing stairs: {ACTIONS;DENIES/REPORTS:21021675::"Denies"} Difficulty getting out of chair: {ACTIONS;DENIES/REPORTS:21021675::"Denies"} Difficulty using hands for taps, buttons, cutlery, and/or writing: {ACTIONS;DENIES/REPORTS:21021675::"Denies"}  No Rheumatology ROS completed.   PMFS History:  Patient Active Problem List   Diagnosis Date Noted  . Palpitation 04/22/2019  . Fatigue 04/22/2019  . Menopause 04/22/2019  . Vitamin D deficiency disease 11/28/2018  . Abdominal pain 03/21/2018  . Nausea with vomiting 03/21/2018  . Primary osteoarthritis of both knees 09/24/2017  . Osteoarthritis of both knees 01/19/2016  . Osteoarthritis of both feet 01/19/2016  . History of TIA (transient ischemic attack) 01/19/2016  . DDD (degenerative disc disease), lumbar 01/19/2016  . Nodular episcleritis of both eyes 01/19/2016  . Pain in right knee 01/19/2016  . Leiomyoma 10/12/2011  . Female pelvic pain 10/12/2011    Past Medical History:  Diagnosis Date  . DDD (degenerative disc disease), lumbar 01/19/2016  . History of TIA (transient ischemic attack) 01/19/2016  . Hypothyroidism   . Osteoarthritis of both feet 01/19/2016  . Osteoarthritis of both knees 01/19/2016  . PONV (postoperative nausea and vomiting)   . Stroke [facility]) 07/2010   "TIA" per patient  . Stroke [facility]) 2018   TIA  . Vitamin D  deficiency disease 11/28/2018    Family History  Problem Relation Age of Onset  . Anuerysm Mother   . Heart attack Mother   . Heart disease Mother   . Diabetes Father   . Hypertension Father   . Diabetes Sister   . Osteoarthritis Sister   . Colon cancer Paternal Uncle    Past Surgical History:  Procedure Laterality Date  . ABDOMINAL HYSTERECTOMY  2013   1 OVARY LEFT-BENIGN TUMOR  . BIOPSY  05/15/2018   Procedure: BIOPSY;  Surgeon: Daneil Dolin, MD;  [facility];  Service: Endoscopy;;  polyp cb  . CHOLECYSTECTOMY    . COLONOSCOPY N/A 05/15/2018   Procedure: COLONOSCOPY;  Surgeon: Daneil Dolin, MD;  [facility];  Service: Endoscopy;  Laterality: N/A;  12:00pm  . EYE SURGERY    . implantable loop recorder placement  11/15/19   Medtronic Reveal Linq model LNQ 22 (SN [id number])  implantable loop recorder by Dr Rayann Heman  . KNEE ARTHROSCOPY     x2  . LAPAROSCOPIC ASSISTED VAGINAL HYSTERECTOMY  10/11/2011   Procedure: LAPAROSCOPIC ASSISTED VAGINAL HYSTERECTOMY;  Surgeon: Margarette Asal, MD;  Location: [facility];  Service: Gynecology;  Laterality: N/A;  . LASIK    . POLYPECTOMY  05/15/2018   Procedure: POLYPECTOMY;  Surgeon: Daneil Dolin, MD;  [facility];  Service: Endoscopy;;  colon    Social History   Social History Narrative   Lives with husband of 2 years(second)  and son/family.Works in [employer] for a long time. Has 30 son 53 years old. Divorced x 1(previously married for 25 yrs), remarried <1. Has  2 dogs.   Immunization History  Administered Date(s) Administered  . Influenza Inj Mdck Quad Pf 01/26/2020  . Influenza,inj,Quad PF,6+ Mos 02/27/2019  . Influenza-Unspecified 02/27/2019  . Tdap 08/24/2017     Objective: Vital Signs: LMP 01/09/2011    Physical Exam   Musculoskeletal Exam: ***  CDAI Exam: CDAI Score: -- Patient Global: --; Provider Global: -- Swollen: --; Tender: -- Joint Exam 07/07/2020   No joint exam has  been documented for this visit   There is currently no information documented on the homunculus. Go to the Rheumatology activity and complete the homunculus joint exam.  Investigation: No additional findings.  Imaging: CUP PACEART REMOTE DEVICE CHECK  Result Date: 06/03/2020 ILR summary report received. Battery status OK. Normal device function. No new tachy, brady, or pause episodes. No new AF episodes. There were six symptom events, some were SR and there were two episode that had tachycardia.  Sent to triage.   Monthly summary reports and ROV/PRN Kathy Breach, RN, CCDS, [employer]   Recent Labs: Lab Results  Component Value Date   WBC 6.8 01/26/2020   HGB 13.4 01/26/2020   PLT 280 01/26/2020   NA 144 01/26/2020   K 4.1 01/26/2020   CL 104 01/26/2020   CO2 32 01/26/2020   GLUCOSE 101 (H) 01/26/2020   BUN 16 01/26/2020   CREATININE 0.56 01/26/2020   BILITOT 1.0 01/26/2020   ALKPHOS 50 07/13/2016   AST 11 01/26/2020   ALT 10 01/26/2020   PROT 7.0 01/26/2020   ALBUMIN 3.8 07/13/2016   CALCIUM 9.8 01/26/2020   GFRAA 124 01/26/2020    Speciality Comments: No specialty comments available.  Procedures:  No procedures performed Allergies: Celebrex [celecoxib]   Assessment / Plan:     Visit Diagnoses: No diagnosis found.  Orders: No orders of the defined types were placed in this encounter.  No orders of the defined types were placed in this encounter.   Face-to-face time spent with patient was *** minutes. Greater than 50% of time was spent in counseling and coordination of care.  Follow-Up Instructions: No follow-ups on file.   Earnestine Mealing, CMA  Note - This record has been created using Editor, commissioning.  Chart creation errors have been sought, but may not always  have been located. Such creation errors do not reflect on  the standard of medical care.

## 2020-06-26 LAB — TESTOS,TOTAL,FREE AND SHBG (FEMALE)
Free Testosterone: 3.8 pg/mL (ref 0.1–6.4)
Sex Hormone Binding: 77 nmol/L (ref 17–124)
Testosterone, Total, LC-MS-MS: 35 ng/dL (ref 2–45)

## 2020-06-26 LAB — PROGESTERONE: Progesterone: 92.5 ng/mL

## 2020-06-26 LAB — ESTRADIOL: Estradiol: 209 pg/mL

## 2020-07-05 ENCOUNTER — Ambulatory Visit (INDEPENDENT_AMBULATORY_CARE_PROVIDER_SITE_OTHER): Payer: 59

## 2020-07-05 DIAGNOSIS — G459 Transient cerebral ischemic attack, unspecified: Secondary | ICD-10-CM

## 2020-07-07 ENCOUNTER — Ambulatory Visit: Payer: 59 | Admitting: Physician Assistant

## 2020-07-07 DIAGNOSIS — M19071 Primary osteoarthritis, right ankle and foot: Secondary | ICD-10-CM

## 2020-07-07 DIAGNOSIS — G8929 Other chronic pain: Secondary | ICD-10-CM

## 2020-07-07 DIAGNOSIS — M5136 Other intervertebral disc degeneration, lumbar region: Secondary | ICD-10-CM

## 2020-07-07 DIAGNOSIS — M170 Bilateral primary osteoarthritis of knee: Secondary | ICD-10-CM

## 2020-07-07 DIAGNOSIS — Z8673 Personal history of transient ischemic attack (TIA), and cerebral infarction without residual deficits: Secondary | ICD-10-CM

## 2020-07-07 DIAGNOSIS — H15123 Nodular episcleritis, bilateral: Secondary | ICD-10-CM

## 2020-07-07 DIAGNOSIS — G5701 Lesion of sciatic nerve, right lower limb: Secondary | ICD-10-CM

## 2020-07-07 DIAGNOSIS — Z9049 Acquired absence of other specified parts of digestive tract: Secondary | ICD-10-CM

## 2020-07-07 DIAGNOSIS — M79641 Pain in right hand: Secondary | ICD-10-CM

## 2020-07-07 LAB — CUP PACEART REMOTE DEVICE CHECK
Date Time Interrogation Session: 20220415200428
Implantable Pulse Generator Implant Date: 20210827

## 2020-07-18 ENCOUNTER — Other Ambulatory Visit (INDEPENDENT_AMBULATORY_CARE_PROVIDER_SITE_OTHER): Payer: Self-pay | Admitting: Internal Medicine

## 2020-07-18 ENCOUNTER — Other Ambulatory Visit: Payer: Self-pay | Admitting: Rheumatology

## 2020-07-19 NOTE — Telephone Encounter (Signed)
Next Visit: 11/11/2020  Last Visit: 05/12/2020  Last Fill: 04/26/2020  Dx: DDD (degenerative disc disease), lumbar   Current Dose per office note on 05/12/2020, baclofen 10 mg in the morning daily for muscle spasms and muscle tension  Okay to refill Baclofen?

## 2020-07-21 NOTE — Progress Notes (Signed)
Carelink Summary Report / Loop Recorder 

## 2020-07-22 NOTE — Addendum Note (Signed)
Addended by: Douglass Rivers D on: 07/22/2020 10:19 AM   Modules accepted: Level of Service

## 2020-08-05 ENCOUNTER — Other Ambulatory Visit (INDEPENDENT_AMBULATORY_CARE_PROVIDER_SITE_OTHER): Payer: Self-pay | Admitting: Internal Medicine

## 2020-08-09 ENCOUNTER — Ambulatory Visit: Payer: 59

## 2020-08-18 ENCOUNTER — Encounter (INDEPENDENT_AMBULATORY_CARE_PROVIDER_SITE_OTHER): Payer: Self-pay | Admitting: Internal Medicine

## 2020-08-18 ENCOUNTER — Telehealth (INDEPENDENT_AMBULATORY_CARE_PROVIDER_SITE_OTHER): Payer: 59 | Admitting: Internal Medicine

## 2020-08-18 VITALS — Resp 18 | Ht 62.0 in | Wt 167.0 lb

## 2020-08-18 DIAGNOSIS — J019 Acute sinusitis, unspecified: Secondary | ICD-10-CM

## 2020-08-18 MED ORDER — AMOXICILLIN-POT CLAVULANATE 875-125 MG PO TABS: 1.0000 | ORAL_TABLET | Freq: Two times a day (BID) | ORAL | 0 refills | Status: DC

## 2020-08-18 NOTE — Progress Notes (Signed)
Pt does not have a vital machine device to get  Pt is at work. Last week 2 COVID test , both negative. DM travel . Not Sinus infection. Mucus is green . All head congestion voice . Throat is scratchy. No body body aches or pains. No fever.

## 2020-08-18 NOTE — Progress Notes (Signed)
Metrics: Intervention Frequency ACO  Documented Smoking Status Yearly  Screened one or more times in 24 months  Cessation Counseling or  Active cessation medication Past 24 months  Past 24 months   Guideline developer: UpToDate (See UpToDate for funding source) Date Released: 2014       Wellness Office Visit  Subjective:  Patient ID: [name], female    DOB: 12-31-1965  Age: 55 y.o. MRN: [id number]  CC: This is an audio telemedicine visit with the permission of the patient who is at home and I am in my office.  I used 2 identifiers to identify the patient. Sinus congestion, sore throat. HPI  The patient describes a 4-day history of the above symptoms.  She had been on vacation to [place] and upon returning she started to get the symptoms.  Prior to arrival to home, she had taken 2 COVID-19 test which were negative but then symptoms started after this testing was done. She denies any loss of taste or smell.  She denies dyspnea or body aches.  She denies a fever. Past Medical History:  Diagnosis Date  . DDD (degenerative disc disease), lumbar 01/19/2016  . History of TIA (transient ischemic attack) 01/19/2016  . Hypothyroidism   . Osteoarthritis of both feet 01/19/2016  . Osteoarthritis of both knees 01/19/2016  . PONV (postoperative nausea and vomiting)   . Stroke [facility]) 07/2010   "TIA" per patient  . Stroke [facility]) 2018   TIA  . Vitamin D deficiency disease 11/28/2018   Past Surgical History:  Procedure Laterality Date  . ABDOMINAL HYSTERECTOMY  2013   1 OVARY LEFT-BENIGN TUMOR  . BIOPSY  05/15/2018   Procedure: BIOPSY;  Surgeon: Daneil Dolin, MD;  [facility];  Service: Endoscopy;;  polyp cb  . CHOLECYSTECTOMY    . COLONOSCOPY N/A 05/15/2018   Procedure: COLONOSCOPY;  Surgeon: Daneil Dolin, MD;  [facility];  Service: Endoscopy;  Laterality: N/A;  12:00pm  . EYE SURGERY    . implantable loop recorder placement  11/15/19   Medtronic Reveal  Linq model LNQ 22 (SN [id number])  implantable loop recorder by Dr Rayann Heman  . KNEE ARTHROSCOPY     x2  . LAPAROSCOPIC ASSISTED VAGINAL HYSTERECTOMY  10/11/2011   Procedure: LAPAROSCOPIC ASSISTED VAGINAL HYSTERECTOMY;  Surgeon: Margarette Asal, MD;  Location: [facility];  Service: Gynecology;  Laterality: N/A;  . LASIK    . POLYPECTOMY  05/15/2018   Procedure: POLYPECTOMY;  Surgeon: Daneil Dolin, MD;  [facility];  Service: Endoscopy;;  colon      Family History  Problem Relation Age of Onset  . Anuerysm Mother   . Heart attack Mother   . Heart disease Mother   . Diabetes Father   . Hypertension Father   . Diabetes Sister   . Osteoarthritis Sister   . Colon cancer Paternal Uncle     Social History   Social History Narrative   Lives with husband of 2 years(second)  and son/family.Works in [employer] for a long time. Has 85 son 53 years old. Divorced x 1(previously married for 25 yrs), remarried <1. Has 2 dogs.   Social History   Tobacco Use  . Smoking status: Never Smoker  . Smokeless tobacco: Never Used  Substance Use Topics  . Alcohol use: Yes    Comment: occasionally    Current Meds  Medication Sig  . amoxicillin-clavulanate (AUGMENTIN) 875-125 MG tablet Take 1 tablet by mouth 2 (  two) times daily.  Marland Kitchen aspirin 325 MG EC tablet Take 325 mg by mouth daily.  . baclofen (LIORESAL) 10 MG tablet TAKE 1 TABLET(10 MG) BY MOUTH DAILY AS NEEDED FOR MUSCLE SPASMS  . Cholecalciferol (VITAMIN D-3) 125 MCG (5000 UT) TABS Take 10,000 Units by mouth daily at 12 noon.  . escitalopram (LEXAPRO) 10 MG tablet Take 1 tablet (10 mg total) by mouth daily.  Marland Kitchen estradiol (ESTRACE) 0.5 MG tablet TAKE 1 TABLET(0.5 MG) BY MOUTH DAILY  . LORazepam (ATIVAN) 0.5 MG tablet Take 1 tablet (0.5 mg total) by mouth 2 (two) times daily as needed for anxiety.  . Misc Natural Products (TART CHERRY ADVANCED PO) Take 1 capsule by mouth daily.   . NP THYROID 120 MG tablet TAKE 1 TABLET(120 MG) BY  MOUTH DAILY BEFORE BREAKFAST  . pravastatin (PRAVACHOL) 10 MG tablet TAKE 1 TABLET BY MOUTH EVERY EVENING  . progesterone (PROMETRIUM) 200 MG capsule TAKE 2 CAPSULES(400 MG) BY MOUTH DAILY  . Testosterone Propionate (FIRST-TESTOSTERONE MC) 2 % CREA Place 5 mg onto the skin daily.  . TURMERIC PO Take 1 capsule by mouth daily.     [place] Office Visit from 05/24/2020 in [facility]  PHQ-9 Total Score 15      Objective:   Today's Vitals: Resp 18   Ht 5\' 2"  (1.575 m)   Wt 167 lb (75.8 kg)   LMP 01/09/2011   BMI 30.54 kg/m  Vitals with BMI 08/18/2020 06/22/2020 05/24/2020  Height 5\' 2"  5\' 2"  5\' 2"   Weight 167 lbs 167 lbs 13 oz (No Data)  BMI 23.53 61.44 -  Systolic (No Data) 315 (No Data)  Diastolic (No Data) 68 (No Data)  Pulse (No Data) 66 100     Physical Exam  She appears to be alert and orientated on the phone.     Assessment   1. Acute non-recurrent sinusitis, unspecified location       Tests ordered No orders of the defined types were placed in this encounter.    Plan: 1. Her symptoms appear to be most likely a sinus infection but I have also told her to get another COVID test now to make sure that we are not dealing with COVID-19 disease.  I have told her that I will treat her with antibiotics empirically.  I have also told her that if she is COVID-19 positive, there is specific antiviral treatment that can be given now. 2. She will let me know. 3. I will see her for regular follow-up in July. 4. This phone call lasted 5 minutes   Meds ordered this encounter  Medications  . amoxicillin-clavulanate (AUGMENTIN) 875-125 MG tablet    Sig: Take 1 tablet by mouth 2 (two) times daily.    Dispense:  20 tablet    Refill:  0    Camary Sosa Luther Parody, MD

## 2020-09-07 ENCOUNTER — Ambulatory Visit (INDEPENDENT_AMBULATORY_CARE_PROVIDER_SITE_OTHER): Payer: 59

## 2020-09-07 DIAGNOSIS — G459 Transient cerebral ischemic attack, unspecified: Secondary | ICD-10-CM

## 2020-09-07 LAB — CUP PACEART REMOTE DEVICE CHECK
Date Time Interrogation Session: 20220620200900
Implantable Pulse Generator Implant Date: 20210827

## 2020-09-27 NOTE — Progress Notes (Signed)
Carelink Summary Report / Loop Recorder 

## 2020-10-07 ENCOUNTER — Ambulatory Visit (INDEPENDENT_AMBULATORY_CARE_PROVIDER_SITE_OTHER): Payer: 59 | Admitting: Internal Medicine

## 2020-10-11 ENCOUNTER — Ambulatory Visit (INDEPENDENT_AMBULATORY_CARE_PROVIDER_SITE_OTHER): Payer: 59

## 2020-10-11 DIAGNOSIS — G459 Transient cerebral ischemic attack, unspecified: Secondary | ICD-10-CM

## 2020-10-12 LAB — CUP PACEART REMOTE DEVICE CHECK
Date Time Interrogation Session: 20220723200549
Implantable Pulse Generator Implant Date: 20210827

## 2020-10-24 ENCOUNTER — Other Ambulatory Visit (INDEPENDENT_AMBULATORY_CARE_PROVIDER_SITE_OTHER): Payer: Self-pay | Admitting: Internal Medicine

## 2020-10-26 ENCOUNTER — Other Ambulatory Visit: Payer: Self-pay | Admitting: Physician Assistant

## 2020-10-26 NOTE — Telephone Encounter (Signed)
Next Visit: 11/11/2020   Last Visit: 05/12/2020   Last Fill: 07/19/2020   Dx: DDD (degenerative disc disease), lumbar    Current Dose per office note on 05/12/2020, baclofen 10 mg in the morning daily for muscle spasms and muscle tension   Okay to refill Baclofen?

## 2020-10-29 NOTE — Progress Notes (Signed)
Office Visit Note  Patient: [name]             Date of Birth: 1965-10-29           MRN: [id number]             PCP: Doree Albee, MD (Inactive) Referring: Doree Albee, MD Visit Date: 11/11/2020 Occupation: '@GUAROCC' @  Subjective:  Right knee joint pain   History of Present Illness: [name] is a 55 y.o. female with history of nodular episcleritis and osteoarthritis.  She denies any episcleritis flares.  She is not having any eye pain, photophobia, or eye redness.  She presents today with increased pain in the right knee joint.  She denies any joint swelling at this time.  She has been taking turmeric, tart cherry, and fish oil which she feels have been helping to control some of her inflammation.  She would like to reapply for visco gel injections, which have provided significant relief in the past.  She states that over the past 3 weeks she has been experiencing increased lower back pain.  She is having pain in both SI joints which she feels is exacerbated by pulling up carpet at her parents house.  Her discomfort has not been improving over the past several weeks.  She has tried taking ibuprofen as needed for pain relief as well as baclofen 10 mg daily but continues to have severe pain at times.  She has been experiencing nocturnal pain on a nightly basis.  She requested bilateral SI joint cortisone injections today.   Activities of Daily Living:  Patient reports morning stiffness for 15 minutes.   Patient Reports nocturnal pain.  Difficulty dressing/grooming: Denies Difficulty climbing stairs: Reports Difficulty getting out of chair: Reports Difficulty using hands for taps, buttons, cutlery, and/or writing: Reports  Review of Systems  Constitutional:  Positive for fatigue.  HENT:  Positive for mouth dryness. Negative for mouth sores and nose dryness.   Eyes:  Negative for pain, itching and dryness.  Respiratory:  Negative for shortness of breath and  difficulty breathing.   Cardiovascular:  Positive for palpitations. Negative for chest pain.  Gastrointestinal:  Negative for blood in stool, constipation and diarrhea.  Endocrine: Negative for increased urination.  Genitourinary:  Negative for difficulty urinating.  Musculoskeletal:  Positive for joint pain, joint pain, joint swelling, morning stiffness and muscle tenderness. Negative for myalgias and myalgias.  Skin:  Negative for color change, rash and redness.  Allergic/Immunologic: Negative for susceptible to infections.  Neurological:  Positive for headaches. Negative for dizziness, numbness, memory loss and weakness.  Hematological:  Positive for bruising/bleeding tendency.  Psychiatric/Behavioral:  Negative for confusion.    PMFS History:  Patient Active Problem List   Diagnosis Date Noted   Palpitation 04/22/2019   Fatigue 04/22/2019   Menopause 04/22/2019   Vitamin D deficiency disease 11/28/2018   Abdominal pain 03/21/2018   Nausea with vomiting 03/21/2018   Primary osteoarthritis of both knees 09/24/2017   Osteoarthritis of both knees 01/19/2016   Osteoarthritis of both feet 01/19/2016   History of TIA (transient ischemic attack) 01/19/2016   DDD (degenerative disc disease), lumbar 01/19/2016   Nodular episcleritis of both eyes 01/19/2016   Pain in right knee 01/19/2016   Leiomyoma 10/12/2011   Female pelvic pain 10/12/2011    Past Medical History:  Diagnosis Date   DDD (degenerative disc disease), lumbar 01/19/2016   History of TIA (transient ischemic attack) 01/19/2016   Hypothyroidism  Osteoarthritis of both feet 01/19/2016   Osteoarthritis of both knees 01/19/2016   PONV (postoperative nausea and vomiting)    Stroke ([place]) 07/2010   "TIA" per patient   Stroke ([place]) 2018   TIA   Vitamin D deficiency disease 11/28/2018    Family History  Problem Relation Age of Onset   Anuerysm Mother    Heart attack Mother    Heart disease Mother    Diabetes Father     Hypertension Father    Diabetes Sister    Osteoarthritis Sister    Colon cancer Paternal Uncle    Past Surgical History:  Procedure Laterality Date   ABDOMINAL HYSTERECTOMY  2013   1 OVARY LEFT-BENIGN TUMOR   BIOPSY  05/15/2018   Procedure: BIOPSY;  Surgeon: Daneil Dolin, MD;  [facility];  Service: Endoscopy;;  polyp cb   CHOLECYSTECTOMY     COLONOSCOPY N/A 05/15/2018   Procedure: COLONOSCOPY;  Surgeon: Daneil Dolin, MD;  [facility];  Service: Endoscopy;  Laterality: N/A;  12:00pm   EYE SURGERY     implantable loop recorder placement  11/15/19   Medtronic Reveal Linq model LNQ 22 ([place] [id number])  implantable loop recorder by Dr Rayann Heman   KNEE ARTHROSCOPY     x2   LAPAROSCOPIC ASSISTED VAGINAL HYSTERECTOMY  10/11/2011   Procedure: LAPAROSCOPIC ASSISTED VAGINAL HYSTERECTOMY;  Surgeon: Margarette Asal, MD;  Location: [facility];  Service: Gynecology;  Laterality: N/A;   LASIK     POLYPECTOMY  05/15/2018   Procedure: POLYPECTOMY;  Surgeon: Daneil Dolin, MD;  [facility];  Service: Endoscopy;;  colon    Social History   Social History Narrative   Lives with husband of 2 years(second)  and son/family.Works in [employer] for a long time. Has 41 son 25 years old. Divorced x 1(previously married for 25 yrs), remarried <1. Has 2 dogs.   Immunization History  Administered Date(s) Administered   Influenza Inj Mdck Quad Pf 01/26/2020   Influenza,inj,Quad PF,6+ Mos 02/27/2019   Influenza-Unspecified 02/27/2019   Tdap 08/24/2017     Objective: Vital Signs: BP 126/80 (BP Location: Left Arm, Patient Position: Sitting, Cuff Size: Normal)   Pulse (!) 54   Ht '5\' 2"'  (1.575 m)   Wt 171 lb 3.2 oz (77.7 kg)   LMP 01/09/2011   BMI 31.31 kg/m    Physical Exam Vitals and nursing note reviewed.  Constitutional:      Appearance: She is well-developed.  HENT:     Head: Normocephalic and atraumatic.  Eyes:     Conjunctiva/sclera: Conjunctivae  normal.  Cardiovascular:     Rate and Rhythm: Regular rhythm.  Pulmonary:     Effort: Pulmonary effort is normal.  Abdominal:     Palpations: Abdomen is soft.  Musculoskeletal:     Cervical back: Normal range of motion.  Skin:    General: Skin is warm and dry.     Capillary Refill: Capillary refill takes less than 2 seconds.  Neurological:     Mental Status: She is alert and oriented to person, place, and time.  Psychiatric:        Behavior: Behavior normal.     Musculoskeletal Exam: C-spine has good range of motion with no discomfort.  Discomfort in the lumbar spine.  No midline spinal tenderness.  Tenderness over both SI joints.  Shoulder joints, elbow joints, wrist joints, MCPs, PIPs, DIPs have good range of motion with no synovitis.  Complete fist formation  bilaterally.  Hip joints have good range of motion with no discomfort.  No tenderness over trochanteric bursa bilaterally.  Limited extension of the right knee joint.  No warmth or effusion of knee joints noted.  Ankle joints have good range of motion with no tenderness or joint swelling.  Bunion of the left great toe noted.  No tenderness over MTP joints.  CDAI Exam: CDAI Score: -- Patient Global: --; Provider Global: -- Swollen: --; Tender: -- Joint Exam 11/11/2020   No joint exam has been documented for this visit   There is currently no information documented on the homunculus. Go to the Rheumatology activity and complete the homunculus joint exam.  Investigation: No additional findings.  Imaging: No results found.  Recent Labs: Lab Results  Component Value Date   WBC 6.8 01/26/2020   HGB 13.4 01/26/2020   PLT 280 01/26/2020   NA 144 01/26/2020   K 4.1 01/26/2020   CL 104 01/26/2020   CO2 32 01/26/2020   GLUCOSE 101 (H) 01/26/2020   BUN 16 01/26/2020   CREATININE 0.56 01/26/2020   BILITOT 1.0 01/26/2020   ALKPHOS 50 07/13/2016   AST 11 01/26/2020   ALT 10 01/26/2020   PROT 7.0 01/26/2020   ALBUMIN 3.8  07/13/2016   CALCIUM 9.8 01/26/2020   GFRAA 124 01/26/2020    Speciality Comments: No specialty comments available.  Procedures:  Sacroiliac Joint Inj on 11/11/2020 10:18 AM Indications: pain Details: 27 G 1.5 in needle, posterior approach Medications (Right): 1 mL lidocaine 1 %; 40 mg triamcinolone acetonide 40 MG/ML Aspirate (Right): 0 mL Medications (Left): 1 mL lidocaine 1 %; 40 mg triamcinolone acetonide 40 MG/ML Aspirate (Left): 0 mL Outcome: tolerated well, no immediate complications Procedure, treatment alternatives, risks and benefits explained, specific risks discussed. Consent was given by the patient. Immediately prior to procedure a time out was called to verify the correct patient, procedure, equipment, support staff and site/side marked as required. Patient was prepped and draped in the usual sterile fashion.    Allergies: Celebrex [celecoxib]   Assessment / Plan:     Visit Diagnoses: Nodular episcleritis of both eyes: She has not had any signs or symptoms of a flare.  She is not experiencing any eye pain or photophobia.  No conjunctival injection was noted on examination today.  Primary osteoarthritis of both hands - 05/12/20: CRP WNL, ESR WNL, RF-, anti-CCP-, 14-3-3 eta negative. X-rays updated 04/2320 consistent with early osteoarthritic changes.  No erosive changes noted.  She continues to experience intermittent pain and stiffness in both hands especially the right hand.  Overall her discomfort has improved.  She had no joint tenderness or synovitis on examination today.  Discussed the importance of joint protection and muscle strengthening.  She was encouraged to perform hand exercises on a daily basis.  Primary osteoarthritis of both knees: She is not experiencing discomfort in her left knee joint at this time.  She has good range of motion of the left knee with no warmth or effusion.  She has started to have increased discomfort in her right knee.  She has limited  extension of her right knee but no warmth or effusion was noted.  Updated x-rays of the right knee were obtained today.  She would like to reapply for Visco gel injections for the right knee.  She noticed a significant benefit from Visco gel injections in the past.  She has been using Voltaren gel topically as needed and takes ibuprofen sparingly for pain relief.  Chronic pain of right knee -She has been experiencing increased pain in the right knee joint.  She has limited extension of the right knee with no warmth or effusion.  She is not experiencing any mechanical symptoms at this time.  She has not had any recent injury or fall.  She has noticed a significant improvement in her knee joint pain after undergoing Visco gel injections in the past.  She would like to reapply for Visco for the right knee.  Updated x-rays of the right knee were obtained today.  She can continue to use Voltaren gel topically as needed for pain relief.  Plan: XR KNEE 3 VIEW RIGHT  Primary osteoarthritis of both feet: Bunion-left 1st MTP joint.  No tenderness or inflammation noted.  Discussed the importance of wearing proper fitting shoes.   DDD (degenerative disc disease), lumbar -She continues to have chronic lower back pain and stiffness which is aggravated by overuse activities.  Her discomfort is usually exacerbated by mopping or mowing the lawn.  She does not have any symptoms of radiculopathy at this time.  No midline spinal tenderness.  She has tenderness over bilateral SI joints on examination today.  X-rays of the pelvis were updated today.  Both SI joints were injected with cortisone as discussed above.  She will continue to take baclofen 10 mg in the morning daily for muscle spasms and muscle tension and ibuprofen as needed for pain relief.   Piriformis syndrome, right - Resolved  Chronic right SI joint pain - HLA-B27 negative: She presents today with increased discomfort in bilateral SI joints which started 3  weeks ago.  She was helping her parents, carpet in her home and has had increased discomfort since then.  She has tried taking ibuprofen for pain relief as well as baclofen 10 mg daily with minimal improvement in her symptoms.  She is not experiencing any symptoms of radiculopathy at this time.  She has tenderness palpation over bilateral SI joints.  X-rays of the pelvis were updated today.  She requested bilateral SI joint cortisone injections which have alleviated her discomfort in the past.  She tolerated the procedure well.  Procedure note was completed above.  Aftercare was discussed.  She plans on continuing to use a heating pad as well as pain patches for pain relief.  She was advised to notify us if her discomfort persists or worsens.- Plan: XR Pelvis 1-2 Views  Chronic left SI joint pain -She has been experiencing increased discomfort in bilateral SI joints.  X-rays of the pelvis were updated today.  Left SI joint was injected with cortisone as discussed above.  Procedure note was completed above.  Aftercare was discussed.  She was advised to notify us if her discomfort persists or worsens.  Plan: XR Pelvis 1-2 Views  Other medical conditions are listed as follows:  History of TIA (transient ischemic attack)  History of cholecystectomy   Orders: Orders Placed This Encounter  Procedures   Sacroiliac Joint Inj   XR KNEE 3 VIEW RIGHT   XR Pelvis 1-2 Views   No orders of the defined types were placed in this encounter.    Follow-Up Instructions: Return in about 6 months (around 05/14/2021) for Osteoarthritis.   Ofilia Neas, PA-C  Note - This record has been created using Dragon software.  Chart creation errors have been sought, but may not always  have been located. Such creation errors do not reflect on  the standard of medical care.

## 2020-11-01 ENCOUNTER — Telehealth: Payer: Self-pay

## 2020-11-01 NOTE — Telephone Encounter (Signed)
Per Jonetta Osgood NP - [facility] will not take this pt at PCP.  11/01/20 ma

## 2020-11-05 NOTE — Progress Notes (Signed)
Carelink Summary Report / Loop Recorder 

## 2020-11-08 ENCOUNTER — Ambulatory Visit (INDEPENDENT_AMBULATORY_CARE_PROVIDER_SITE_OTHER): Payer: 59 | Admitting: Internal Medicine

## 2020-11-09 ENCOUNTER — Other Ambulatory Visit (INDEPENDENT_AMBULATORY_CARE_PROVIDER_SITE_OTHER): Payer: Self-pay

## 2020-11-10 MED ORDER — LORAZEPAM 0.5 MG PO TABS: 0.5000 mg | ORAL_TABLET | Freq: Two times a day (BID) | ORAL | 1 refills | Status: DC | PRN

## 2020-11-11 ENCOUNTER — Other Ambulatory Visit: Payer: Self-pay

## 2020-11-11 ENCOUNTER — Ambulatory Visit: Payer: Self-pay

## 2020-11-11 ENCOUNTER — Ambulatory Visit: Payer: 59 | Admitting: Physician Assistant

## 2020-11-11 ENCOUNTER — Encounter: Payer: Self-pay | Admitting: Physician Assistant

## 2020-11-11 ENCOUNTER — Telehealth: Payer: Self-pay

## 2020-11-11 VITALS — BP 126/80 | HR 54 | Ht 62.0 in | Wt 171.2 lb

## 2020-11-11 DIAGNOSIS — M51369 Other intervertebral disc degeneration, lumbar region without mention of lumbar back pain or lower extremity pain: Secondary | ICD-10-CM

## 2020-11-11 DIAGNOSIS — G5701 Lesion of sciatic nerve, right lower limb: Secondary | ICD-10-CM

## 2020-11-11 DIAGNOSIS — M25561 Pain in right knee: Secondary | ICD-10-CM | POA: Diagnosis not present

## 2020-11-11 DIAGNOSIS — M533 Sacrococcygeal disorders, not elsewhere classified: Secondary | ICD-10-CM

## 2020-11-11 DIAGNOSIS — M170 Bilateral primary osteoarthritis of knee: Secondary | ICD-10-CM

## 2020-11-11 DIAGNOSIS — G8929 Other chronic pain: Secondary | ICD-10-CM | POA: Diagnosis not present

## 2020-11-11 DIAGNOSIS — M19071 Primary osteoarthritis, right ankle and foot: Secondary | ICD-10-CM | POA: Diagnosis not present

## 2020-11-11 DIAGNOSIS — M79641 Pain in right hand: Secondary | ICD-10-CM

## 2020-11-11 DIAGNOSIS — H15123 Nodular episcleritis, bilateral: Secondary | ICD-10-CM | POA: Diagnosis not present

## 2020-11-11 DIAGNOSIS — M19042 Primary osteoarthritis, left hand: Secondary | ICD-10-CM

## 2020-11-11 DIAGNOSIS — M19072 Primary osteoarthritis, left ankle and foot: Secondary | ICD-10-CM

## 2020-11-11 DIAGNOSIS — M19041 Primary osteoarthritis, right hand: Secondary | ICD-10-CM | POA: Diagnosis not present

## 2020-11-11 DIAGNOSIS — M5136 Other intervertebral disc degeneration, lumbar region: Secondary | ICD-10-CM

## 2020-11-11 DIAGNOSIS — Z8673 Personal history of transient ischemic attack (TIA), and cerebral infarction without residual deficits: Secondary | ICD-10-CM

## 2020-11-11 DIAGNOSIS — Z9049 Acquired absence of other specified parts of digestive tract: Secondary | ICD-10-CM

## 2020-11-11 MED ORDER — TRIAMCINOLONE ACETONIDE 40 MG/ML IJ SUSP
40.0000 mg | INTRAMUSCULAR | Status: AC | PRN
  Administered 2020-11-11: 40 mg via INTRA_ARTICULAR

## 2020-11-11 MED ORDER — LIDOCAINE HCL 1 % IJ SOLN
1.0000 mL | INTRAMUSCULAR | Status: AC | PRN
Start: 2020-11-11 — End: 2020-11-11
  Administered 2020-11-11: 1 mL

## 2020-11-11 MED ORDER — LIDOCAINE HCL 1 % IJ SOLN
1.0000 mL | INTRAMUSCULAR | Status: AC | PRN
  Administered 2020-11-11: 1 mL

## 2020-11-11 NOTE — Progress Notes (Signed)
Please call the patient to review x-ray results: unremarkable x-ray of SI joints.    Right knee has moderate OA and severe chondromalacia patella.  We are going to apply for visco gel injections for the right knee.

## 2020-11-11 NOTE — Telephone Encounter (Signed)
Please apply for right knee visco, per Taylor Dale, PA-C. Thanks!  

## 2020-11-15 ENCOUNTER — Ambulatory Visit (INDEPENDENT_AMBULATORY_CARE_PROVIDER_SITE_OTHER): Payer: 59

## 2020-11-15 DIAGNOSIS — G459 Transient cerebral ischemic attack, unspecified: Secondary | ICD-10-CM

## 2020-11-16 LAB — CUP PACEART REMOTE DEVICE CHECK
Date Time Interrogation Session: 20220825200700
Implantable Pulse Generator Implant Date: 20210827

## 2020-11-17 NOTE — Telephone Encounter (Signed)
Submitted for VOB 11/16/2020.

## 2020-11-18 NOTE — Telephone Encounter (Signed)
I LMOM for patient to call to discuss benefits, and schedule Visco injections.

## 2020-11-18 NOTE — Telephone Encounter (Signed)
Please call to schedule patient for Visco knee injections.  Authorized for [employer] series, Right knee. Buy and Bill. Deductible does not apply.  No PA required.  Insurance to cover 100% of allowable cost for administration, and medication with a $17.00 copay each visit.

## 2020-11-26 NOTE — Progress Notes (Signed)
Carelink Summary Report / Loop Recorder 

## 2020-11-29 ENCOUNTER — Other Ambulatory Visit: Payer: Self-pay

## 2020-11-29 ENCOUNTER — Ambulatory Visit: Payer: 59 | Admitting: Physician Assistant

## 2020-11-29 DIAGNOSIS — M1711 Unilateral primary osteoarthritis, right knee: Secondary | ICD-10-CM

## 2020-11-29 MED ORDER — LIDOCAINE HCL 1 % IJ SOLN
1.5000 mL | INTRAMUSCULAR | Status: AC | PRN
  Administered 2020-11-29: 1.5 mL via INTRA_ARTICULAR

## 2020-11-29 MED ORDER — SODIUM HYALURONATE (VISCOSUP) 20 MG/2ML IX SOSY
20.0000 mg | PREFILLED_SYRINGE | INTRA_ARTICULAR | Status: AC | PRN
  Administered 2020-11-29: 20 mg via INTRA_ARTICULAR

## 2020-11-29 NOTE — Progress Notes (Signed)
   Procedure Note  Patient: [name]             Date of Birth: 05/15/1965           MRN: [id number]             Visit Date: 11/29/2020  Procedures: Visit Diagnoses:  1. Primary osteoarthritis of right knee     Euflexxa #1 right knee, B/B Large Joint Inj: R knee on 11/29/2020 10:31 AM Indications: pain Details: 27 G 1.5 in needle, medial approach  Arthrogram: No  Medications: 1.5 mL lidocaine 1 %; 20 mg Sodium Hyaluronate 20 MG/2ML Aspirate: 0 mL Outcome: tolerated well, no immediate complications Procedure, treatment alternatives, risks and benefits explained, specific risks discussed. Consent was given by the patient. Immediately prior to procedure a time out was called to verify the correct patient, procedure, equipment, support staff and site/side marked as required. Patient was prepped and draped in the usual sterile fashion.     Patient tolerated the procedure well.  Aftercare was discussed.  Hazel Sams, PA-C

## 2020-12-06 ENCOUNTER — Ambulatory Visit: Payer: 59 | Admitting: Physician Assistant

## 2020-12-06 ENCOUNTER — Other Ambulatory Visit: Payer: Self-pay

## 2020-12-06 DIAGNOSIS — M1711 Unilateral primary osteoarthritis, right knee: Secondary | ICD-10-CM

## 2020-12-06 MED ORDER — SODIUM HYALURONATE (VISCOSUP) 20 MG/2ML IX SOSY
20.0000 mg | PREFILLED_SYRINGE | INTRA_ARTICULAR | Status: AC | PRN
  Administered 2020-12-06: 20 mg via INTRA_ARTICULAR

## 2020-12-06 MED ORDER — LIDOCAINE HCL 1 % IJ SOLN
1.5000 mL | INTRAMUSCULAR | Status: AC | PRN
  Administered 2020-12-06: 1.5 mL via INTRA_ARTICULAR

## 2020-12-06 NOTE — Progress Notes (Signed)
   Procedure Note  Patient: [name]             Date of Birth: 18-Aug-1965           MRN: [id number]             Visit Date: 12/06/2020  Procedures: Visit Diagnoses:  1. Primary osteoarthritis of right knee    Euflexxa #2 right knee, B/B Large Joint Inj: R knee on 12/06/2020 11:55 AM Indications: pain Details: 27 G 1.5 in needle, medial approach  Arthrogram: No  Medications: 1.5 mL lidocaine 1 %; 20 mg Sodium Hyaluronate 20 MG/2ML Aspirate: 0 mL Outcome: tolerated well, no immediate complications Procedure, treatment alternatives, risks and benefits explained, specific risks discussed. Consent was given by the patient. Immediately prior to procedure a time out was called to verify the correct patient, procedure, equipment, support staff and site/side marked as required. Patient was prepped and draped in the usual sterile fashion.     Patient tolerated the procedure well.  Aftercare was discussed.  Hazel Sams, PA-C

## 2020-12-13 ENCOUNTER — Ambulatory Visit: Payer: 59 | Admitting: Physician Assistant

## 2020-12-13 ENCOUNTER — Other Ambulatory Visit: Payer: Self-pay

## 2020-12-13 DIAGNOSIS — M1711 Unilateral primary osteoarthritis, right knee: Secondary | ICD-10-CM | POA: Diagnosis not present

## 2020-12-13 MED ORDER — SODIUM HYALURONATE (VISCOSUP) 20 MG/2ML IX SOSY
20.0000 mg | PREFILLED_SYRINGE | INTRA_ARTICULAR | Status: AC | PRN
Start: 2020-12-13 — End: 2020-12-13
  Administered 2020-12-13: 20 mg via INTRA_ARTICULAR

## 2020-12-13 MED ORDER — LIDOCAINE HCL 1 % IJ SOLN
1.5000 mL | INTRAMUSCULAR | Status: AC | PRN
  Administered 2020-12-13: 1.5 mL via INTRA_ARTICULAR

## 2020-12-13 NOTE — Progress Notes (Signed)
   Procedure Note  Patient: [name]             Date of Birth: October 21, 1965           MRN: [id number]             Visit Date: 12/13/2020  Procedures: Visit Diagnoses:  1. Primary osteoarthritis of right knee    Euflexxa #3 right knee, B/B  Large Joint Inj: R knee on 12/13/2020 10:51 AM Indications: pain Details: 27 G 1.5 in needle, medial approach  Arthrogram: No  Medications: 1.5 mL lidocaine 1 %; 20 mg Sodium Hyaluronate 20 MG/2ML Aspirate: 0 mL Outcome: tolerated well, no immediate complications Procedure, treatment alternatives, risks and benefits explained, specific risks discussed. Consent was given by the patient. Immediately prior to procedure a time out was called to verify the correct patient, procedure, equipment, support staff and site/side marked as required. Patient was prepped and draped in the usual sterile fashion.    Patient tolerated the procedure well.  Aftercare was discussed.  Hazel Sams, PA-C

## 2020-12-20 ENCOUNTER — Ambulatory Visit (INDEPENDENT_AMBULATORY_CARE_PROVIDER_SITE_OTHER): Payer: 59

## 2020-12-20 DIAGNOSIS — R002 Palpitations: Secondary | ICD-10-CM

## 2020-12-20 LAB — CUP PACEART REMOTE DEVICE CHECK
Date Time Interrogation Session: 20220927201022
Implantable Pulse Generator Implant Date: 20210827

## 2020-12-28 NOTE — Progress Notes (Signed)
Carelink Summary Report / Loop Recorder 

## 2021-01-18 LAB — CUP PACEART REMOTE DEVICE CHECK
Date Time Interrogation Session: 20221030200753
Implantable Pulse Generator Implant Date: 20210827

## 2021-01-24 ENCOUNTER — Ambulatory Visit (INDEPENDENT_AMBULATORY_CARE_PROVIDER_SITE_OTHER): Payer: 59

## 2021-01-24 DIAGNOSIS — G459 Transient cerebral ischemic attack, unspecified: Secondary | ICD-10-CM | POA: Diagnosis not present

## 2021-02-01 NOTE — Progress Notes (Signed)
Carelink Summary Report / Loop Recorder 

## 2021-02-23 ENCOUNTER — Other Ambulatory Visit: Payer: Self-pay | Admitting: *Deleted

## 2021-02-23 LAB — CUP PACEART REMOTE DEVICE CHECK
Date Time Interrogation Session: 20221202200718
Implantable Pulse Generator Implant Date: 20210827

## 2021-02-23 MED ORDER — BACLOFEN 10 MG PO TABS: ORAL_TABLET | ORAL | 2 refills | Status: DC

## 2021-02-23 NOTE — Telephone Encounter (Signed)
Next Visit: 05/17/2021  Last Visit: 11/11/2020  Last Fill: 10/26/2020  Dx: Chronic right SI joint pain   Current Dose per office note on 11/11/2020: baclofen 10 mg daily   Okay to refill Baclofen?

## 2021-02-28 ENCOUNTER — Ambulatory Visit (INDEPENDENT_AMBULATORY_CARE_PROVIDER_SITE_OTHER): Payer: 59

## 2021-02-28 DIAGNOSIS — G459 Transient cerebral ischemic attack, unspecified: Secondary | ICD-10-CM | POA: Diagnosis not present

## 2021-03-10 NOTE — Progress Notes (Signed)
Carelink Summary Report / Loop Recorder 

## 2021-04-04 ENCOUNTER — Ambulatory Visit (INDEPENDENT_AMBULATORY_CARE_PROVIDER_SITE_OTHER): Payer: 59

## 2021-04-04 DIAGNOSIS — G459 Transient cerebral ischemic attack, unspecified: Secondary | ICD-10-CM

## 2021-04-04 LAB — CUP PACEART REMOTE DEVICE CHECK
Date Time Interrogation Session: 20230115230310
Implantable Pulse Generator Implant Date: 20210827

## 2021-04-18 NOTE — Progress Notes (Signed)
Carelink Summary Report / Loop Recorder 

## 2021-05-03 NOTE — Progress Notes (Unsigned)
Office Visit Note  Patient: [name]             Date of Birth: [birth year]           MRN: [id number]             PCP: Doree Albee, MD (Inactive) Referring: No ref. provider found Visit Date: 05/17/2021 Occupation: @GUAROCC @  Subjective:  No chief complaint on file.   History of Present Illness: [name] is a 56 y.o. female ***   Activities of Daily Living:  Patient reports morning stiffness for *** {minute/hour:19697}.   Patient {ACTIONS;DENIES/REPORTS:21021675::"Denies"} nocturnal pain.  Difficulty dressing/grooming: {ACTIONS;DENIES/REPORTS:21021675::"Denies"} Difficulty climbing stairs: {ACTIONS;DENIES/REPORTS:21021675::"Denies"} Difficulty getting out of chair: {ACTIONS;DENIES/REPORTS:21021675::"Denies"} Difficulty using hands for taps, buttons, cutlery, and/or writing: {ACTIONS;DENIES/REPORTS:21021675::"Denies"}  No Rheumatology ROS completed.   PMFS History:  Patient Active Problem List   Diagnosis Date Noted   Palpitation 04/22/2019   Fatigue 04/22/2019   Menopause 04/22/2019   Vitamin D deficiency disease 11/28/2018   Abdominal pain 03/21/2018   Nausea with vomiting 03/21/2018   Primary osteoarthritis of both knees 09/24/2017   Osteoarthritis of both knees 01/19/2016   Osteoarthritis of both feet 01/19/2016   History of TIA (transient ischemic attack) 01/19/2016   DDD (degenerative disc disease), lumbar 01/19/2016   Nodular episcleritis of both eyes 01/19/2016   Pain in right knee 01/19/2016   Leiomyoma 10/12/2011   Female pelvic pain 10/12/2011    Past Medical History:  Diagnosis Date   DDD (degenerative disc disease), lumbar 01/19/2016   History of TIA (transient ischemic attack) 01/19/2016   Hypothyroidism    Osteoarthritis of both feet 01/19/2016   Osteoarthritis of both knees 01/19/2016   PONV (postoperative nausea and vomiting)    Stroke ([place]) 07/2010   "TIA" per patient   Stroke ([place]) 2018   TIA   Vitamin D deficiency disease  11/28/2018    Family History  Problem Relation Age of Onset   Anuerysm Mother    Heart attack Mother    Heart disease Mother    Diabetes Father    Hypertension Father    Diabetes Sister    Osteoarthritis Sister    Colon cancer Paternal Uncle    Past Surgical History:  Procedure Laterality Date   ABDOMINAL HYSTERECTOMY  2013   1 OVARY LEFT-BENIGN TUMOR   BIOPSY  05/15/2018   Procedure: BIOPSY;  Surgeon: Daneil Dolin, MD;  [facility];  Service: Endoscopy;;  polyp cb   CHOLECYSTECTOMY     COLONOSCOPY N/A 05/15/2018   Procedure: COLONOSCOPY;  Surgeon: Daneil Dolin, MD;  [facility];  Service: Endoscopy;  Laterality: N/A;  12:00pm   EYE SURGERY     implantable loop recorder placement  11/15/19   Medtronic Reveal Linq model LNQ 22 ([place] [id number])  implantable loop recorder by Dr Rayann Heman   KNEE ARTHROSCOPY     x2   LAPAROSCOPIC ASSISTED VAGINAL HYSTERECTOMY  10/11/2011   Procedure: LAPAROSCOPIC ASSISTED VAGINAL HYSTERECTOMY;  Surgeon: Margarette Asal, MD;  Location: [facility];  Service: Gynecology;  Laterality: N/A;   LASIK     POLYPECTOMY  05/15/2018   Procedure: POLYPECTOMY;  Surgeon: Daneil Dolin, MD;  [facility];  Service: Endoscopy;;  colon    Social History   Social History Narrative   Lives with husband of 2 years(second)  and son/family.Works in [employer] for a long time. Has 26 son 50 years old. Divorced x 1(previously married for 25 yrs),  remarried <1. Has 2 dogs.   Immunization History  Administered Date(s) Administered   Influenza Inj Mdck Quad Pf 01/26/2020   Influenza,inj,Quad PF,6+ Mos 02/27/2019   Influenza-Unspecified 02/27/2019   Tdap 08/24/2017     Objective: Vital Signs: LMP 01/09/2011    Physical Exam   Musculoskeletal Exam: ***  CDAI Exam: CDAI Score: -- Patient Global: --; Provider Global: -- Swollen: --; Tender: -- Joint Exam 05/17/2021   No joint exam has been documented for this visit   There  is currently no information documented on the homunculus. Go to the Rheumatology activity and complete the homunculus joint exam.  Investigation: No additional findings.  Imaging: CUP PACEART REMOTE DEVICE CHECK  Result Date: 04/04/2021 ILR summary report received. Battery status OK. Normal device function. No new symptom, tachy, Paula, or pause episodes. No new AF episodes. Monthly summary reports and ROV/PRN LA   Recent Labs: Lab Results  Component Value Date   WBC 6.8 01/26/2020   HGB 13.4 01/26/2020   PLT 280 01/26/2020   NA 144 01/26/2020   K 4.1 01/26/2020   CL 104 01/26/2020   CO2 32 01/26/2020   GLUCOSE 101 (H) 01/26/2020   BUN 16 01/26/2020   CREATININE 0.56 01/26/2020   BILITOT 1.0 01/26/2020   ALKPHOS 50 07/13/2016   AST 11 01/26/2020   ALT 10 01/26/2020   PROT 7.0 01/26/2020   ALBUMIN 3.8 07/13/2016   CALCIUM 9.8 01/26/2020   GFRAA 124 01/26/2020    Speciality Comments: No specialty comments available.  Procedures:  No procedures performed Allergies: Celebrex [celecoxib]   Assessment / Plan:     Visit Diagnoses: No diagnosis found.  Orders: No orders of the defined types were placed in this encounter.  No orders of the defined types were placed in this encounter.   Face-to-face time spent with patient was *** minutes. Greater than 50% of time was spent in counseling and coordination of care.  Follow-Up Instructions: No follow-ups on file.   Earnestine Mealing, CMA  Note - This record has been created using Editor, commissioning.  Chart creation errors have been sought, but may not always  have been located. Such creation errors do not reflect on  the standard of medical care.

## 2021-05-09 ENCOUNTER — Ambulatory Visit (INDEPENDENT_AMBULATORY_CARE_PROVIDER_SITE_OTHER): Payer: 59

## 2021-05-09 DIAGNOSIS — G459 Transient cerebral ischemic attack, unspecified: Secondary | ICD-10-CM

## 2021-05-09 LAB — CUP PACEART REMOTE DEVICE CHECK
Date Time Interrogation Session: 20230219231127
Implantable Pulse Generator Implant Date: 20210827

## 2021-05-12 ENCOUNTER — Ambulatory Visit (HOSPITAL_BASED_OUTPATIENT_CLINIC_OR_DEPARTMENT_OTHER): Payer: 59 | Admitting: Internal Medicine

## 2021-05-12 ENCOUNTER — Encounter (HOSPITAL_BASED_OUTPATIENT_CLINIC_OR_DEPARTMENT_OTHER): Payer: Self-pay | Admitting: Internal Medicine

## 2021-05-12 ENCOUNTER — Other Ambulatory Visit: Payer: Self-pay

## 2021-05-12 VITALS — BP 110/68 | HR 59 | Ht 62.0 in | Wt 177.4 lb

## 2021-05-12 DIAGNOSIS — R079 Chest pain, unspecified: Secondary | ICD-10-CM | POA: Diagnosis not present

## 2021-05-12 DIAGNOSIS — R002 Palpitations: Secondary | ICD-10-CM

## 2021-05-12 DIAGNOSIS — E785 Hyperlipidemia, unspecified: Secondary | ICD-10-CM | POA: Diagnosis not present

## 2021-05-12 DIAGNOSIS — Z8673 Personal history of transient ischemic attack (TIA), and cerebral infarction without residual deficits: Secondary | ICD-10-CM

## 2021-05-12 DIAGNOSIS — G459 Transient cerebral ischemic attack, unspecified: Secondary | ICD-10-CM

## 2021-05-12 LAB — CUP PACEART REMOTE DEVICE CHECK
Date Time Interrogation Session: 20230223090406
Implantable Pulse Generator Implant Date: 20210827

## 2021-05-12 NOTE — Progress Notes (Signed)
PCP: Pablo Lawrence, NP Primary Cardiologist: Dr Harl Bowie Primary EP: Dr Mallie Darting Paula is a 56 y.o. female who presents today for routine electrophysiology followup.  Since last being seen in our clinic, the patient reports doing reasonably well.  She reports rare and nonsustained palpitations.  Over the past 3 weeks, she did have 3 episodes of nonexertional chest tightness, lasting less than 15 minutes.  Prior Cardiac CT (2020) reviewed and normal with calcium score of 0.  Her symptoms have improved.  She walked for exercise this past week without any exertional symptoms.  Today, she denies symptoms of shortness of breath,  lower extremity edema, dizziness, presyncope, or syncope.  The patient is otherwise without complaint today.   Past Medical History:  Diagnosis Date   DDD (degenerative disc disease), lumbar 01/19/2016   History of TIA (transient ischemic attack) 01/19/2016   Hypothyroidism    Osteoarthritis of both feet 01/19/2016   Osteoarthritis of both knees 01/19/2016   PONV (postoperative nausea and vomiting)    Stroke ([place]) 07/2010   "TIA" per patient   Stroke ([place]) 2018   TIA   Vitamin D deficiency disease 11/28/2018   Past Surgical History:  Procedure Laterality Date   ABDOMINAL HYSTERECTOMY  2013   1 OVARY LEFT-BENIGN TUMOR   BIOPSY  05/15/2018   Procedure: BIOPSY;  Surgeon: Daneil Dolin, MD;  [facility];  Service: Endoscopy;;  polyp cb   CHOLECYSTECTOMY     COLONOSCOPY N/A 05/15/2018   Procedure: COLONOSCOPY;  Surgeon: Daneil Dolin, MD;  [facility];  Service: Endoscopy;  Laterality: N/A;  12:00pm   EYE SURGERY     implantable loop recorder placement  11/15/19   Medtronic Reveal Linq model LNQ 22 ([place] [id number])  implantable loop recorder by Dr Rayann Heman   KNEE ARTHROSCOPY     x2   LAPAROSCOPIC ASSISTED VAGINAL HYSTERECTOMY  10/11/2011   Procedure: LAPAROSCOPIC ASSISTED VAGINAL HYSTERECTOMY;  Surgeon: Margarette Asal, MD;   Location: [facility];  Service: Gynecology;  Laterality: N/A;   LASIK     POLYPECTOMY  05/15/2018   Procedure: POLYPECTOMY;  Surgeon: Daneil Dolin, MD;  [facility];  Service: Endoscopy;;  colon     ROS- all systems are reviewed and negatives except as per HPI above  Current Outpatient Medications  Medication Sig Dispense Refill   aspirin 325 MG EC tablet Take 325 mg by mouth daily.     baclofen (LIORESAL) 10 MG tablet TAKE 1 TABLET(10 MG) BY MOUTH DAILY AS NEEDED FOR MUSCLE SPASMS 30 tablet 2   Cholecalciferol (VITAMIN D-3) 125 MCG (5000 UT) TABS Take 10,000 Units by mouth daily at 12 noon.     clonazePAM (KLONOPIN) 0.5 MG tablet Take 0.5 mg by mouth daily as needed.     escitalopram (LEXAPRO) 10 MG tablet TAKE 1 TABLET(10 MG) BY MOUTH DAILY 30 tablet 3   Misc Natural Products (TART CHERRY ADVANCED PO) Take 1 capsule by mouth daily.      phentermine (ADIPEX-P) 37.5 MG tablet Take 37.5 mg by mouth daily.     thyroid (ARMOUR) 60 MG tablet Take 1 tablet by mouth daily.     TURMERIC PO Take 1 capsule by mouth daily.     pravastatin (PRAVACHOL) 10 MG tablet TAKE 1 TABLET BY MOUTH EVERY EVENING 90 tablet 0   No current facility-administered medications for this visit.    Physical Exam: Vitals:   05/12/21 0928  BP: 110/68  Pulse: (!) 59  SpO2: 99%  Weight: 177 lb 6.4 oz (80.5 kg)  Height: 5\' 2"  (1.575 m)    GEN- The patient is well appearing, alert and oriented x 3 today.   Head- normocephalic, atraumatic Eyes-  Sclera clear, conjunctiva pink Ears- hearing intact Oropharynx- clear Lungs- Clear to ausculation bilaterally, normal work of breathing Heart- Regular rate and rhythm, no murmurs, rubs or gallops, PMI not laterally displaced GI- soft, NT, ND, + BS Extremities- no clubbing, cyanosis, or edema  Wt Readings from Last 3 Encounters:  05/12/21 177 lb 6.4 oz (80.5 kg)  11/11/20 171 lb 3.2 oz (77.7 kg)  08/18/20 167 lb (75.8 kg)    EKG tracing ordered today is  personally reviewed and shows sinus  Assessment and Plan:  Palpitations Clinically stable and due to PACs/ nonsustained atach No changes advised  2. H/o TIA No sustained AFIB by ILR Continue ASA and statin  3. HL Continue statin  4. Atypical chest pain Cardiac CT 2020 is reassuring Will order POET to further risk stratify  Return in a year if POET is low risk  Thompson Grayer MD, [facility] 05/12/2021 9:31 AM

## 2021-05-12 NOTE — Patient Instructions (Addendum)
Medication Instructions:  Your physician recommends that you continue on your current medications as directed. Please refer to the Current Medication list given to you today.  Labwork: None ordered.  Testing/Procedures: You will be scheduled for an exercise treadmill test at the [facility] office.  Follow-Up: Your physician wants you to follow-up in: one year with Dr. Rayann Heman or one of the following Advanced Practice Providers on your designated Care Team:   Tommye Standard, [place] Legrand Como "Jonni Sanger" [place], [place] You will receive a reminder letter in the mail two months in advance. If you don't receive a letter, please call our office to schedule the follow-up appointment.  Any Other Special Instructions Will Be Listed Below (If Applicable).  If you need a refill on your cardiac medications before your next appointment, please call your pharmacy.                         Patient Instructions for Stress Test  Medication instructions:   All AM meds oks  Do not eat, drink or use tobacco products four hours prior to the test.  Water is ok.  3.  Dress prepared to exercise in a comfortable, two piece clothing outfit and walking shoes.  4.  Bring any current prescription medications with you the day of the test.  5.  Notify the office 24 hours in advance if you cannot keep this appointment.  6.  If you have any questions, please call [phone].

## 2021-05-16 NOTE — Progress Notes (Signed)
Carelink Summary Report / Loop Recorder 

## 2021-05-17 ENCOUNTER — Ambulatory Visit: Payer: 59 | Admitting: Rheumatology

## 2021-05-17 ENCOUNTER — Other Ambulatory Visit: Payer: Self-pay

## 2021-05-17 ENCOUNTER — Ambulatory Visit (INDEPENDENT_AMBULATORY_CARE_PROVIDER_SITE_OTHER): Payer: 59

## 2021-05-17 DIAGNOSIS — R079 Chest pain, unspecified: Secondary | ICD-10-CM | POA: Diagnosis not present

## 2021-05-17 DIAGNOSIS — G5701 Lesion of sciatic nerve, right lower limb: Secondary | ICD-10-CM

## 2021-05-17 DIAGNOSIS — Z9049 Acquired absence of other specified parts of digestive tract: Secondary | ICD-10-CM

## 2021-05-17 DIAGNOSIS — M19041 Primary osteoarthritis, right hand: Secondary | ICD-10-CM

## 2021-05-17 DIAGNOSIS — M170 Bilateral primary osteoarthritis of knee: Secondary | ICD-10-CM

## 2021-05-17 DIAGNOSIS — M5136 Other intervertebral disc degeneration, lumbar region: Secondary | ICD-10-CM

## 2021-05-17 DIAGNOSIS — H15123 Nodular episcleritis, bilateral: Secondary | ICD-10-CM

## 2021-05-17 DIAGNOSIS — Z8673 Personal history of transient ischemic attack (TIA), and cerebral infarction without residual deficits: Secondary | ICD-10-CM

## 2021-05-17 DIAGNOSIS — G8929 Other chronic pain: Secondary | ICD-10-CM

## 2021-05-17 DIAGNOSIS — M19071 Primary osteoarthritis, right ankle and foot: Secondary | ICD-10-CM

## 2021-05-17 LAB — EXERCISE TOLERANCE TEST
Angina Index: 0
Duke Treadmill Score: 6
Estimated workload: 7
Exercise duration (min): 6 min
Exercise duration (sec): 0 s
MPHR: 167 {beats}/min
Peak HR: 171 {beats}/min
Percent HR: 102 %
RPE: 15
Rest HR: 93 {beats}/min
ST Depression (mm): 0 mm

## 2021-05-17 NOTE — Progress Notes (Signed)
Office Visit Note  Patient: [name]             Date of Birth: May 02, 1965           MRN: [id number]             PCP: Pablo Lawrence, NP Referring: Pablo Lawrence, NP Visit Date: 05/19/2021 Occupation: '@GUAROCC' @  Subjective:  Right knee pain  History of Present Illness: [name] is a 56 y.o. female with history of osteoarthritis.  She continues to have pain and discomfort in her right knee joint.  She had a right knee joint cortisone injection on December 13, 2020.  She had relief for a while.  She states for the last couple of weeks she has been having pain and discomfort in her right knee joint.  She complains of nocturnal pain.  She states the pain radiates down into her calf.  She had good response to SI joint injections given in August 2022.  She has intermittent discomfort in her lower back.  She states that she has joined [employer] and has lost 5 pounds in the last 3 weeks.  She has been also doing stretching exercises for her back and her knee joints.  Activities of Daily Living:  Patient reports morning stiffness for 15 minutes.   Patient Reports nocturnal pain.  Difficulty dressing/grooming: Denies Difficulty climbing stairs: Reports Difficulty getting out of chair: Reports Difficulty using hands for taps, buttons, cutlery, and/or writing: Reports  Review of Systems  Constitutional:  Positive for fatigue.  HENT:  Positive for mouth dryness. Negative for mouth sores and nose dryness.   Eyes:  Negative for pain, itching and dryness.  Respiratory:  Negative for shortness of breath and difficulty breathing.   Cardiovascular:  Positive for palpitations. Negative for chest pain.  Gastrointestinal:  Negative for blood in stool, constipation and diarrhea.  Endocrine: Negative for increased urination.  Genitourinary:  Negative for difficulty urinating.  Musculoskeletal:  Positive for joint pain, joint pain, myalgias, morning stiffness, muscle tenderness and  myalgias. Negative for joint swelling.  Skin:  Negative for color change, rash and redness.  Allergic/Immunologic: Negative for susceptible to infections.  Neurological:  Positive for weakness. Negative for dizziness, numbness, headaches and memory loss.  Hematological:  Positive for bruising/bleeding tendency.  Psychiatric/Behavioral:  Negative for confusion.    PMFS History:  Patient Active Problem List   Diagnosis Date Noted   Hyperlipemia 05/12/2021   Chest pain of uncertain etiology 98/01/9146   Palpitation 04/22/2019   Fatigue 04/22/2019   Menopause 04/22/2019   Vitamin D deficiency disease 11/28/2018   Abdominal pain 03/21/2018   Nausea with vomiting 03/21/2018   Primary osteoarthritis of both knees 09/24/2017   Osteoarthritis of both knees 01/19/2016   Osteoarthritis of both feet 01/19/2016   History of TIA (transient ischemic attack) 01/19/2016   DDD (degenerative disc disease), lumbar 01/19/2016   Nodular episcleritis of both eyes 01/19/2016   Pain in right knee 01/19/2016   Leiomyoma 10/12/2011   Female pelvic pain 10/12/2011    Past Medical History:  Diagnosis Date   DDD (degenerative disc disease), lumbar 01/19/2016   History of TIA (transient ischemic attack) 01/19/2016   Hypothyroidism    Osteoarthritis of both feet 01/19/2016   Osteoarthritis of both knees 01/19/2016   PONV (postoperative nausea and vomiting)    Stroke ([place]) 07/2010   "TIA" per patient   Stroke ([place]) 2018   TIA   Vitamin D deficiency disease 11/28/2018  Family History  Problem Relation Age of Onset   Anuerysm Mother    Heart attack Mother    Heart disease Mother    Diabetes Father    Hypertension Father    Diabetes Sister    Osteoarthritis Sister    Fibromyalgia Sister    Colon cancer Paternal Uncle    Past Surgical History:  Procedure Laterality Date   ABDOMINAL HYSTERECTOMY  2013   1 OVARY LEFT-BENIGN TUMOR   BIOPSY  05/15/2018   Procedure: BIOPSY;  Surgeon: Daneil Dolin,  MD;  [facility];  Service: Endoscopy;;  polyp cb   CHOLECYSTECTOMY     COLONOSCOPY N/A 05/15/2018   Procedure: COLONOSCOPY;  Surgeon: Daneil Dolin, MD;  [facility];  Service: Endoscopy;  Laterality: N/A;  12:00pm   EYE SURGERY     implantable loop recorder placement  11/15/19   Medtronic Reveal Linq model LNQ 22 ([place] [id number])  implantable loop recorder by Dr Rayann Heman   KNEE ARTHROSCOPY     x2   LAPAROSCOPIC ASSISTED VAGINAL HYSTERECTOMY  10/11/2011   Procedure: LAPAROSCOPIC ASSISTED VAGINAL HYSTERECTOMY;  Surgeon: Margarette Asal, MD;  Location: [facility];  Service: Gynecology;  Laterality: N/A;   LASIK     POLYPECTOMY  05/15/2018   Procedure: POLYPECTOMY;  Surgeon: Daneil Dolin, MD;  [facility];  Service: Endoscopy;;  colon    Social History   Social History Narrative   Lives with husband of 2 years(second)  and son/family.Works in [employer] for a long time. Has 54 son 67 years old. Divorced x 1(previously married for 25 yrs), remarried <1. Has 2 dogs.   Immunization History  Administered Date(s) Administered   Influenza Inj Mdck Quad Pf 01/26/2020   Influenza,inj,Quad PF,6+ Mos 02/27/2019   Influenza-Unspecified 02/27/2019   Tdap 08/24/2017     Objective: Vital Signs: BP 115/75 (BP Location: Left Arm, Patient Position: Sitting, Cuff Size: Normal)    Pulse (!) 59    Ht '5\' 2"'  (1.575 m)    Wt 179 lb (81.2 kg)    LMP 01/09/2011    BMI 32.74 kg/m    Physical Exam Vitals and nursing note reviewed.  Constitutional:      Appearance: She is well-developed.  HENT:     Head: Normocephalic and atraumatic.  Eyes:     Conjunctiva/sclera: Conjunctivae normal.  Cardiovascular:     Rate and Rhythm: Normal rate and regular rhythm.     Heart sounds: Normal heart sounds.  Pulmonary:     Effort: Pulmonary effort is normal.     Breath sounds: Normal breath sounds.  Abdominal:     General: Bowel sounds are normal.     Palpations: Abdomen is  soft.  Musculoskeletal:     Cervical back: Normal range of motion.  Lymphadenopathy:     Cervical: No cervical adenopathy.  Skin:    General: Skin is warm and dry.     Capillary Refill: Capillary refill takes less than 2 seconds.  Neurological:     Mental Status: She is alert and oriented to person, place, and time.  Psychiatric:        Behavior: Behavior normal.     Musculoskeletal Exam: C-spine thoracic and lumbar spine were in good range of motion.  Shoulder joints, elbow joints, wrist joints, MCPs PIPs and DIPs with good range of motion with no synovitis.  She had bilateral PIP and DIP thickening.  Hip joints were in good range of motion.  Right knee valgus deformity was noted.  No warmth swelling or effusion was noted.  Left knee joint was in good range of motion without any discomfort.  There was no tenderness over ankles or MTPs.  CDAI Exam: CDAI Score: -- Patient Global: --; Provider Global: -- Swollen: --; Tender: -- Joint Exam 05/19/2021   No joint exam has been documented for this visit   There is currently no information documented on the homunculus. Go to the Rheumatology activity and complete the homunculus joint exam.  Investigation: No additional findings.  Imaging: Exercise Tolerance Test  Result Date: 05/17/2021   No ST deviation was noted. Normal ECG treadmill stress test   CUP PACEART REMOTE DEVICE CHECK  Result Date: 05/12/2021 Loop check remote during in clinic visit. Battery status: good. 3 symptom episodes, 0 tachy episodes, 0 pause episodes, 0 brady episodes. 10 AF episodes (0.7% burden). PVC Burden 0.5%  Symptom episodes appear SR with short burst of tachy rate.  Monthly summary reports and ROV with Dr. Rayann Heman.Trena Platt, BSN, RN  CUP PACEART REMOTE DEVICE CHECK  Result Date: 05/09/2021 ILR summary report received. Battery status OK. Normal device function. No new tachy, brady, or pause episodes. No new AF episodes. Monthly summary reports and ROV/PRN  2 symptom activation's both show 3-4sec increase in HR prior to activation. LA   Recent Labs: Lab Results  Component Value Date   WBC 6.8 01/26/2020   HGB 13.4 01/26/2020   PLT 280 01/26/2020   NA 144 01/26/2020   K 4.1 01/26/2020   CL 104 01/26/2020   CO2 32 01/26/2020   GLUCOSE 101 (H) 01/26/2020   BUN 16 01/26/2020   CREATININE 0.56 01/26/2020   BILITOT 1.0 01/26/2020   ALKPHOS 50 07/13/2016   AST 11 01/26/2020   ALT 10 01/26/2020   PROT 7.0 01/26/2020   ALBUMIN 3.8 07/13/2016   CALCIUM 9.8 01/26/2020   GFRAA 124 01/26/2020    Speciality Comments: No specialty comments available.  Procedures:  Large Joint Inj: R knee on 05/19/2021 3:30 PM Indications: pain Details: 27 G 1.5 in needle, medial approach  Arthrogram: No  Medications: 40 mg triamcinolone acetonide 40 MG/ML; 1.5 mL lidocaine 1 % Aspirate: 0 mL Outcome: tolerated well, no immediate complications Procedure, treatment alternatives, risks and benefits explained, specific risks discussed. Consent was given by the patient. Immediately prior to procedure a time out was called to verify the correct patient, procedure, equipment, support staff and site/side marked as required. Patient was prepped and draped in the usual sterile fashion.    Allergies: Celebrex [celecoxib]   Assessment / Plan:     Visit Diagnoses: Nodular episcleritis of both eyes-she has not had recurrence of the episcleritis since the initial episode.  Primary osteoarthritis of both hands -she had bilateral DIP and PIP thickening.  No synovitis was noted.  05/12/20: CRP WNL, ESR WNL, RF-, anti-CCP-, 14-3-3 eta negative. X-rays updated 04/2320 consistent with early osteoarthritic changes.  No erosive changes noted.  Primary osteoarthritis of both knees-she has been having increased pain and discomfort in her right knee joint.  She had a cortisone injection in her right knee joint in September 2022 which lasted until recently.  X-rays obtained in August  2022 showed moderate osteoarthritis with lateral compartment narrowing and severe patellofemoral narrowing.  She has right knee valgus deformity.  She has joint which monitors and had intentional weight loss of 5 pounds in the last 3 weeks.  Weight loss diet and exercise was emphasized.  She requested  right knee joint cortisone injection.  After informed consent was obtained right knee joint was injected with lidocaine and cortisone as described above.  She tolerated the procedure well.  Postprocedure instructions were given.  A prescription for right knee joint brace was given.  Primary osteoarthritis of both feet - Bunion-left 1st MTP joint.  Proper fitting shoes were discussed.  DDD (degenerative disc disease), lumbar-she continues to have intermittent discomfort in her lumbar spine.  She had mild levoscoliosis and disc disease on the lumbar spine x-rays obtained on May 14, 2019.  Facet joint arthropathy was also noted.  Weight loss will be helpful.  Core strengthening exercises were discussed.  She gets relief from baclofen.  Prescription refill for baclofen was given.  Side effects of baclofen were reviewed.  Chronic right SI joint pain - HLA-B27 negative: She continues to have SI joint discomfort intermittently.  She had good response to bilateral SI joint injections in August 2022.  Chronic left SI joint pain  History of cholecystectomy  History of TIA (transient ischemic attack)  Orders: No orders of the defined types were placed in this encounter.  Meds ordered this encounter  Medications   baclofen (LIORESAL) 10 MG tablet    Sig: TAKE 1 TABLET(10 MG) BY MOUTH DAILY AS NEEDED FOR MUSCLE SPASMS    Dispense:  30 tablet    Refill:  2     Follow-Up Instructions: Return in about 6 months (around 11/19/2021) for Osteoarthritis.   Bo Merino, MD  Note - This record has been created using Editor, commissioning.  Chart creation errors have been sought, but may not always  have  been located. Such creation errors do not reflect on  the standard of medical care.

## 2021-05-19 ENCOUNTER — Other Ambulatory Visit: Payer: Self-pay

## 2021-05-19 ENCOUNTER — Encounter: Payer: Self-pay | Admitting: Rheumatology

## 2021-05-19 ENCOUNTER — Ambulatory Visit: Payer: 59 | Admitting: Rheumatology

## 2021-05-19 VITALS — BP 115/75 | HR 59 | Ht 62.0 in | Wt 179.0 lb

## 2021-05-19 DIAGNOSIS — M19041 Primary osteoarthritis, right hand: Secondary | ICD-10-CM | POA: Diagnosis not present

## 2021-05-19 DIAGNOSIS — G5701 Lesion of sciatic nerve, right lower limb: Secondary | ICD-10-CM

## 2021-05-19 DIAGNOSIS — M19072 Primary osteoarthritis, left ankle and foot: Secondary | ICD-10-CM

## 2021-05-19 DIAGNOSIS — M5136 Other intervertebral disc degeneration, lumbar region: Secondary | ICD-10-CM

## 2021-05-19 DIAGNOSIS — M170 Bilateral primary osteoarthritis of knee: Secondary | ICD-10-CM | POA: Diagnosis not present

## 2021-05-19 DIAGNOSIS — M19042 Primary osteoarthritis, left hand: Secondary | ICD-10-CM

## 2021-05-19 DIAGNOSIS — M19071 Primary osteoarthritis, right ankle and foot: Secondary | ICD-10-CM | POA: Diagnosis not present

## 2021-05-19 DIAGNOSIS — Z9049 Acquired absence of other specified parts of digestive tract: Secondary | ICD-10-CM

## 2021-05-19 DIAGNOSIS — M533 Sacrococcygeal disorders, not elsewhere classified: Secondary | ICD-10-CM

## 2021-05-19 DIAGNOSIS — H15123 Nodular episcleritis, bilateral: Secondary | ICD-10-CM | POA: Diagnosis not present

## 2021-05-19 DIAGNOSIS — G8929 Other chronic pain: Secondary | ICD-10-CM

## 2021-05-19 DIAGNOSIS — Z8673 Personal history of transient ischemic attack (TIA), and cerebral infarction without residual deficits: Secondary | ICD-10-CM

## 2021-05-19 MED ORDER — TRIAMCINOLONE ACETONIDE 40 MG/ML IJ SUSP
40.0000 mg | INTRAMUSCULAR | Status: AC | PRN
  Administered 2021-05-19: 40 mg via INTRA_ARTICULAR

## 2021-05-19 MED ORDER — LIDOCAINE HCL 1 % IJ SOLN
1.5000 mL | INTRAMUSCULAR | Status: AC | PRN
  Administered 2021-05-19: 1.5 mL

## 2021-05-19 MED ORDER — BACLOFEN 10 MG PO TABS: ORAL_TABLET | ORAL | 2 refills | Status: DC

## 2021-06-13 ENCOUNTER — Ambulatory Visit (INDEPENDENT_AMBULATORY_CARE_PROVIDER_SITE_OTHER): Payer: 59

## 2021-06-13 DIAGNOSIS — R002 Palpitations: Secondary | ICD-10-CM

## 2021-06-14 LAB — CUP PACEART REMOTE DEVICE CHECK
Date Time Interrogation Session: 20230326230556
Implantable Pulse Generator Implant Date: 20210827

## 2021-06-23 NOTE — Progress Notes (Signed)
Carelink Summary Report / Loop Recorder 

## 2021-07-18 ENCOUNTER — Ambulatory Visit (INDEPENDENT_AMBULATORY_CARE_PROVIDER_SITE_OTHER): Payer: 59

## 2021-07-18 DIAGNOSIS — R002 Palpitations: Secondary | ICD-10-CM

## 2021-07-19 LAB — CUP PACEART REMOTE DEVICE CHECK
Date Time Interrogation Session: 20230502084220
Implantable Pulse Generator Implant Date: 20210827

## 2021-08-02 NOTE — Progress Notes (Signed)
Carelink Summary Report / Loop Recorder 

## 2021-08-12 ENCOUNTER — Other Ambulatory Visit: Payer: Self-pay | Admitting: *Deleted

## 2021-08-12 MED ORDER — BACLOFEN 10 MG PO TABS: ORAL_TABLET | ORAL | 2 refills | Status: DC

## 2021-08-12 NOTE — Telephone Encounter (Signed)
Refill request received via fax  Next Visit: 11/23/2021  Last Visit: 05/19/2021  Last Fill: 05/19/2021  DX: DDD (degenerative disc disease), lumbar  Current Dose per office note 05/19/2021: Dose not discussed  Okay to refill Baclofen?

## 2021-08-22 ENCOUNTER — Ambulatory Visit (INDEPENDENT_AMBULATORY_CARE_PROVIDER_SITE_OTHER): Payer: 59

## 2021-08-22 DIAGNOSIS — Z8673 Personal history of transient ischemic attack (TIA), and cerebral infarction without residual deficits: Secondary | ICD-10-CM

## 2021-08-22 LAB — CUP PACEART REMOTE DEVICE CHECK
Date Time Interrogation Session: 20230531230437
Implantable Pulse Generator Implant Date: 20210827

## 2021-09-08 NOTE — Progress Notes (Signed)
Carelink Summary Report / Loop Recorder 

## 2021-09-25 LAB — CUP PACEART REMOTE DEVICE CHECK
Date Time Interrogation Session: 20230703230417
Implantable Pulse Generator Implant Date: 20210827

## 2021-09-26 ENCOUNTER — Ambulatory Visit (INDEPENDENT_AMBULATORY_CARE_PROVIDER_SITE_OTHER): Payer: 59

## 2021-09-26 DIAGNOSIS — R001 Bradycardia, unspecified: Secondary | ICD-10-CM | POA: Diagnosis not present

## 2021-10-24 NOTE — Progress Notes (Signed)
Carelink Summary Report / Loop Recorder 

## 2021-10-27 LAB — CUP PACEART REMOTE DEVICE CHECK
Date Time Interrogation Session: 20230805230837
Implantable Pulse Generator Implant Date: 20210827

## 2021-10-31 ENCOUNTER — Ambulatory Visit: Payer: 59

## 2021-11-09 NOTE — Progress Notes (Addendum)
Office Visit Note  Patient: [name]             Date of Birth: 09-Dec-1965           MRN: [id number]             PCP: Pablo Lawrence, NP Referring: Pablo Lawrence, NP Visit Date: 11/23/2021 Occupation: @GUAROCC @  Subjective:  Low back pain   History of Present Illness: [name] is a 56 y.o. female with history of nodular scleritis, osteoarthritis, and DDD.  Patient presents today with increased discomfort in her lower back.  She states that her discomfort has worsened over the past 1 month.  She denies any recent injury or fall.  She denies any symptoms of radiculopathy.  She is having ongoing SI joint tenderness upon palpation as well as some midline soreness.  She denies any fevers, chills, bowel or bladder incontinence.  She states that most of her discomfort is bandlike.  Her symptoms are worse while sitting for prolonged peers of time and has had increased discomfort at night.  She has tried taking Tylenol arthritis as well as ibuprofen.  She is also taking baclofen 10 mg 1 tablet by mouth daily as needed for muscle spasms.  Patient requested to increase the dose of baclofen to twice daily as needed until she is able to see a spine specialist.  She also requested bilateral SI joint cortisone injections which have alleviated her discomfort in the past. Patient states that she continues to have ongoing pain and stiffness in her right knee.  She had a right knee joint cortisone injection on 05/19/2021, which provided temporary relief.  She previously had a good response to euflexxa injections in the right knee. She is unable to fully extend her right knee.  She denies any mechanical symptoms at this time.  She continues to have intermittent swelling in her right knee.  She denies any discomfort or swelling in the left knee.  She denies any other joint pain or joint swelling at this time.  She would like to reapply for Euflexxa injections for both knees. She denies any signs or  symptoms of a scleritis flare. She denies any new medical conditions.   Activities of Daily Living:  Patient reports morning stiffness for several hours.   Patient Reports nocturnal pain.  Difficulty dressing/grooming: Denies Difficulty climbing stairs: Reports Difficulty getting out of chair: Denies Difficulty using hands for taps, buttons, cutlery, and/or writing: Reports  Review of Systems  Constitutional:  Positive for fatigue.  HENT:  Positive for mouth dryness. Negative for mouth sores.   Eyes:  Negative for dryness.  Respiratory:  Negative for shortness of breath.   Cardiovascular:  Positive for palpitations. Negative for chest pain.  Gastrointestinal:  Negative for blood in stool, constipation and diarrhea.  Endocrine: Negative for increased urination.  Genitourinary:  Negative for involuntary urination.  Musculoskeletal:  Positive for joint pain, joint pain, joint swelling, myalgias, muscle weakness, morning stiffness, muscle tenderness and myalgias. Negative for gait problem.  Skin:  Positive for hair loss. Negative for color change, rash and sensitivity to sunlight.  Allergic/Immunologic: Negative for susceptible to infections.  Neurological:  Positive for dizziness and headaches.  Hematological:  Positive for bruising/bleeding tendency. Negative for swollen glands.  Psychiatric/Behavioral:  Positive for sleep disturbance. Negative for depressed mood. The patient is not nervous/anxious.     PMFS History:  Patient Active Problem List   Diagnosis Date Noted   Hyperlipemia 05/12/2021   Chest pain  of uncertain etiology 06/08/2246   Palpitation 04/22/2019   Fatigue 04/22/2019   Menopause 04/22/2019   Vitamin D deficiency disease 11/28/2018   Abdominal pain 03/21/2018   Nausea with vomiting 03/21/2018   Primary osteoarthritis of both knees 09/24/2017   Osteoarthritis of both knees 01/19/2016   Osteoarthritis of both feet 01/19/2016   History of TIA (transient ischemic  attack) 01/19/2016   DDD (degenerative disc disease), lumbar 01/19/2016   Nodular episcleritis of both eyes 01/19/2016   Pain in right knee 01/19/2016   Leiomyoma 10/12/2011   Female pelvic pain 10/12/2011    Past Medical History:  Diagnosis Date   DDD (degenerative disc disease), lumbar 01/19/2016   History of TIA (transient ischemic attack) 01/19/2016   Hypothyroidism    Osteoarthritis of both feet 01/19/2016   Osteoarthritis of both knees 01/19/2016   PONV (postoperative nausea and vomiting)    Stroke ([place]) 07/2010   "TIA" per patient   Stroke ([place]) 2018   TIA   Vitamin D deficiency disease 11/28/2018    Family History  Problem Relation Age of Onset   Anuerysm Mother    Heart attack Mother    Heart disease Mother    Diabetes Father    Hypertension Father    Macular degeneration Father    Diabetes Sister    Osteoarthritis Sister    Fibromyalgia Sister    Colon cancer Paternal Uncle    Past Surgical History:  Procedure Laterality Date   ABDOMINAL HYSTERECTOMY  2013   1 OVARY LEFT-BENIGN TUMOR   BIOPSY  05/15/2018   Procedure: BIOPSY;  Surgeon: Daneil Dolin, MD;  [facility]: [facility];  Service: Endoscopy;;  polyp cb   CHOLECYSTECTOMY     COLONOSCOPY N/A 05/15/2018   Procedure: COLONOSCOPY;  Surgeon: Daneil Dolin, MD;  Location: [facility];  Service: Endoscopy;  Laterality: N/A;  12:00pm   EYE SURGERY     implantable loop recorder placement  11/15/19   Medtronic Reveal Linq model LNQ 22 ([place] [id number])  implantable loop recorder by Dr Rayann Heman   KNEE ARTHROSCOPY     x2   LAPAROSCOPIC ASSISTED VAGINAL HYSTERECTOMY  10/11/2011   Procedure: LAPAROSCOPIC ASSISTED VAGINAL HYSTERECTOMY;  Surgeon: Margarette Asal, MD;  Location: [facility];  Service: Gynecology;  Laterality: N/A;   LASIK     POLYPECTOMY  05/15/2018   Procedure: POLYPECTOMY;  Surgeon: Daneil Dolin, MD;  Location: [facility];  Service: Endoscopy;;  colon    Social History   Social History Narrative    Lives with husband of 2 years(second)  and son/family.Works in [employer] for a long time. Has 24 son 19 years old. Divorced x 1(previously married for 25 yrs), remarried <1. Has 2 dogs.   Immunization History  Administered Date(s) Administered   Influenza Inj Mdck Quad Pf 01/26/2020   Influenza,inj,Quad PF,6+ Mos 02/27/2019   Influenza-Unspecified 02/27/2019   Tdap 08/24/2017     Objective: Vital Signs: BP 129/82 (BP Location: Left Arm, Patient Position: Sitting, Cuff Size: Normal)   Pulse (!) 59   Resp 15   Ht $R'5\' 2"'ar$  (1.575 m)   Wt 166 lb (75.3 kg)   LMP 01/09/2011   BMI 30.36 kg/m    Physical Exam Vitals and nursing note reviewed.  Constitutional:      Appearance: She is well-developed.  HENT:     Head: Normocephalic and atraumatic.  Eyes:     Conjunctiva/sclera: Conjunctivae normal.  Cardiovascular:     Rate and Rhythm:  Normal rate and regular rhythm.     Heart sounds: Normal heart sounds.  Pulmonary:     Effort: Pulmonary effort is normal.     Breath sounds: Normal breath sounds.  Abdominal:     General: Bowel sounds are normal.     Palpations: Abdomen is soft.  Musculoskeletal:     Cervical back: Normal range of motion.  Lymphadenopathy:     Cervical: No cervical adenopathy.  Skin:    General: Skin is warm and dry.     Capillary Refill: Capillary refill takes less than 2 seconds.  Neurological:     Mental Status: She is alert and oriented to person, place, and time.  Psychiatric:        Behavior: Behavior normal.      Musculoskeletal Exam: C-spine has good range of motion.  No midline spinal tenderness in the C-spine or thoracic region.  She has midline spinal tenderness in the lumbar region.  Tenderness over both SI joints.  Shoulder joints, elbow joints, wrist joints, MCPs, PIPs, DIPs have good range of motion with no synovitis.  Some PIP and DIP thickening consistent with osteoarthritic changes, right greater than left hand.  Complete fist formation  bilaterally.  Hip joints have good range of motion with no groin pain.  Left knee has good range of motion with no warmth or effusion.  Right knee joint has limited extension with warmth but no effusion.  Ankle joints have good range of motion with no tenderness or joint swelling.  No tenderness over MTP joints.  Bunions noted bilaterally, left greater than right.  CDAI Exam: CDAI Score: -- Patient Global: --; Provider Global: -- Swollen: --; Tender: -- Joint Exam 11/23/2021   No joint exam has been documented for this visit   There is currently no information documented on the homunculus. Go to the Rheumatology activity and complete the homunculus joint exam.  Investigation: No additional findings.  Imaging: XR KNEE 3 VIEW RIGHT  Result Date: 11/23/2021 Severe lateral compartment narrowing with lateral and intercondylar osteophytes was noted.  Severe patellofemoral narrowing was noted.  No chondrocalcinosis was noted. Impression: These findings are consistent with severe osteoarthritis and severe chondromalacia patella.  XR Lumbar Spine 2-3 Views  Result Date: 11/23/2021 Mild levoscoliosis was noted.  Anterior osteophytes were noted.  Mild spondylosis was noted.  Facet joint arthropathy was noted. Impression: These findings are consistent with lumbar spondylosis and facet joint arthropathy.  XR Pelvis 1-2 Views  Result Date: 11/23/2021 No hip joint narrowing was noted.  No SI joint narrowing or sclerosis was noted. Impression: Unremarkable x-rays of the hip joints and SI joints.   Recent Labs: Lab Results  Component Value Date   WBC 6.8 01/26/2020   HGB 13.4 01/26/2020   PLT 280 01/26/2020   NA 144 01/26/2020   K 4.1 01/26/2020   CL 104 01/26/2020   CO2 32 01/26/2020   GLUCOSE 101 (H) 01/26/2020   BUN 16 01/26/2020   CREATININE 0.56 01/26/2020   BILITOT 1.0 01/26/2020   ALKPHOS 50 07/13/2016   AST 11 01/26/2020   ALT 10 01/26/2020   PROT 7.0 01/26/2020   ALBUMIN 3.8  07/13/2016   CALCIUM 9.8 01/26/2020   GFRAA 124 01/26/2020    Speciality Comments: No specialty comments available.  Procedures:  Sacroiliac Joint Inj on 11/23/2021 9:21 AM Indications: pain Details: 27 G 1.5 in needle, posterior approach Medications (Right): 1 mL lidocaine 1 %; 40 mg triamcinolone acetonide 40 MG/ML Aspirate (Right): 0 mL Medications (  Left): 1 mL lidocaine 1 %; 40 mg triamcinolone acetonide 40 MG/ML Aspirate (Left): 0 mL Outcome: tolerated well, no immediate complications Procedure, treatment alternatives, risks and benefits explained, specific risks discussed. Consent was given by the patient. Immediately prior to procedure a time out was called to verify the correct patient, procedure, equipment, support staff and site/side marked as required. Patient was prepped and draped in the usual sterile fashion.     Allergies: Celebrex [celecoxib]   Assessment / Plan:     Visit Diagnoses: Nodular episcleritis of both eyes: She has not had any signs or symptoms of episcleritis flare.  No conjunctival injection was noted on examination today.  She does not require systemic immunosuppression.  She was advised to notify us if she develops signs or symptoms of a flare.  Primary osteoarthritis of both hands - 05/12/20: CRP WNL, ESR WNL, RF-, anti-CCP-, 14-3-3 eta negative. X-rays updated 04/2320 consistent with early osteoarthritic changes.  No erosive changes noted.  She has PIP and DIP prominence consistent with osteoarthritic changes, right hand greater than left.  She has no synovitis on examination today.  Complete fist formation noted bilaterally.  Discussed the importance of joint protection and muscle strengthening.  She plans to continue to perform hand exercises daily.  Primary osteoarthritis of both knees - s/p euflexxa right knee 11/2020.  She has good range of motion of the left knee joint with no discomfort.  No warmth or effusion of the left knee joint noted on examination  today.  The right knee joint has limited extension with warmth but no effusion.  X-rays of the right knee were updated today.  She has severe osteoarthritis and severe chondromalacia patella.  She continues to have ongoing pain and stiffness in her right knee.  She had a cortisone injection on 05/19/2021 which provided temporary relief.  In the past she had great results after undergoing Euflexxa injections in the right knee.  She would like to reapply for Euflexxa injections for the right knee.  Chronic pain of right knee -She has ongoing pain and stiffness in the right knee joint.  X-rays of the right knee were obtained today for further evaluation.  She would like to reapply for Euflexxa injections for the right knee which have alleviated her symptoms in the past.  Discussed the importance of lower extremity muscle strengthening.  Plan: XR KNEE 3 VIEW RIGHT  This patient is diagnosed with osteoarthritis of the knee(s).    Radiographs show evidence of joint space narrowing, osteophytes, subchondral sclerosis and/or subchondral cysts.  This patient has knee pain which interferes with functional and activities of daily living.    This patient has experienced inadequate response, adverse effects and/or intolerance with conservative treatments such as acetaminophen, NSAIDS, topical creams, physical therapy or regular exercise, knee bracing and/or weight loss.   This patient has experienced inadequate response or has a contraindication to intra articular steroid injections for at least 3 months.   This patient is not scheduled to have a total knee replacement within 6 months of starting treatment with viscosupplementation.  Chronic bilateral low back pain without sciatica -Patient presents today with increased discomfort in her lower back for the past 1 month.  She has not had any recent injury or fall.  She is not currently experiencing any symptoms of radiculopathy.  Her discomfort is exacerbated by  sitting for prolonged periods of time and she has had intermittent nocturnal pain.  She has tried taking Tylenol arthritis, ibuprofen, baclofen 10 mg  1 tablet daily as needed but has had persistent discomfort.  In the past she has had lower back pain which was alleviated by bilateral SI joint injections.  On examination today she has painful range of motion of the lumbar spine with some midline spinal tenderness in the lumbar region as well as tenderness over both SI joints.  X-rays of the lumbar spine and pelvis were updated today.  Different treatment options were discussed.  She requested bilateral SI joint cortisone injections today.  The prescription for baclofen will be increased to 10 mg twice daily as needed temporarily to alleviate her discomfort.  Referral to Dr. Rolena Infante will also be placed today for further evaluation and management of her lower back discomfort.  Plan: XR Pelvis 1-2 Views, XR Lumbar Spine 2-3 Views  Primary osteoarthritis of both feet - Bunion-left 1st MTP joint.  She has some PIP and DIP thickening consistent with osteoarthritis of both feet.  No tenderness or synovitis noted.  Ankle joints have good range of motion with no tenderness or synovitis.  DDD (degenerative disc disease), lumbar - She had mild levoscoliosis and disc disease on the lumbar spine x-rays obtained on May 14, 2019.  Facet joint arthropathy was also noted.  She presents today with increased pain and stiffness in her lower back.  No symptoms of radiculopathy at this time.  X-rays of the lumbar spine and pelvis were obtained today for further evaluation.  A referral to Dr. Rolena Infante was placed for further evaluation and management.  Chronic right SI joint pain - HLA-B27 negative: She presents today with acute on chronic pain in the right SI joint.  No recent injury or fall.  Her symptoms have been exacerbated by sitting for prolonged periods of time.  X-rays of the lumbar spine and pelvis were obtained today for  further evaluation.  She requested bilateral SI joint cortisone injections today.  Referral to Dr. Rolena Infante was placed for further evaluation and management.  Her prescription for baclofen will be changed to 10 mg twice daily as needed for muscle spasms until she has been evaluated by Dr. Rolena Infante.- Plan: XR Pelvis 1-2 Views  Chronic left SI joint pain - She presents today with increased discomfort in her lower back.  She has tenderness palpation over bilateral SI joints.  X-rays of the lumbar spine and pelvis were updated today.  She requested bilateral SI joint cortisone injections.  She tolerated the procedures well.  Procedure notes were completed above.  Aftercare was discussed.  She was advised to notify us if her symptoms persist or worsen.  A referral to Dr. Rolena Infante will be placed today for further evaluation and management.  Plan: XR Pelvis 1-2 Views  Other medical conditions are listed as follows:  History of cholecystectomy  History of TIA (transient ischemic attack)    Orders: Orders Placed This Encounter  Procedures   Sacroiliac Joint Inj   XR KNEE 3 VIEW RIGHT   XR Pelvis 1-2 Views   XR Lumbar Spine 2-3 Views   AMB referral to orthopedics   Meds ordered this encounter  Medications   baclofen (LIORESAL) 10 MG tablet    Sig: Take 1 tablet (10 mg total) by mouth 2 (two) times daily as needed for muscle spasms.    Dispense:  60 tablet    Refill:  2      Follow-Up Instructions: Return in about 6 months (around 05/24/2022) for Nodular scleritis, Osteoarthritis, DDD.   Ofilia Neas, PA-C  Note - This  record has been created using [employer].  Chart creation errors have been sought, but may not always  have been located. Such creation errors do not reflect on  the standard of medical care.

## 2021-11-18 ENCOUNTER — Other Ambulatory Visit: Payer: Self-pay | Admitting: *Deleted

## 2021-11-18 MED ORDER — BACLOFEN 10 MG PO TABS: ORAL_TABLET | ORAL | 2 refills | Status: DC

## 2021-11-18 NOTE — Telephone Encounter (Signed)
Refill request received via fax from [facility] for Baclofen   Next Visit: 11/23/2021   Last Visit: 05/19/2021   Last Fill: 08/12/2021    DX: DDD (degenerative disc disease), lumbar   Current Dose per office note 05/19/2021: Dose not discussed   Okay to refill Baclofen?

## 2021-11-23 ENCOUNTER — Ambulatory Visit (INDEPENDENT_AMBULATORY_CARE_PROVIDER_SITE_OTHER): Payer: 59

## 2021-11-23 ENCOUNTER — Encounter: Payer: Self-pay | Admitting: Physician Assistant

## 2021-11-23 ENCOUNTER — Ambulatory Visit: Payer: 59 | Attending: Physician Assistant | Admitting: Physician Assistant

## 2021-11-23 ENCOUNTER — Telehealth: Payer: Self-pay

## 2021-11-23 VITALS — BP 129/82 | HR 59 | Resp 15 | Ht 62.0 in | Wt 166.0 lb

## 2021-11-23 DIAGNOSIS — M19041 Primary osteoarthritis, right hand: Secondary | ICD-10-CM

## 2021-11-23 DIAGNOSIS — M170 Bilateral primary osteoarthritis of knee: Secondary | ICD-10-CM | POA: Diagnosis not present

## 2021-11-23 DIAGNOSIS — M5136 Other intervertebral disc degeneration, lumbar region: Secondary | ICD-10-CM

## 2021-11-23 DIAGNOSIS — G8929 Other chronic pain: Secondary | ICD-10-CM

## 2021-11-23 DIAGNOSIS — M25561 Pain in right knee: Secondary | ICD-10-CM | POA: Diagnosis not present

## 2021-11-23 DIAGNOSIS — M19072 Primary osteoarthritis, left ankle and foot: Secondary | ICD-10-CM

## 2021-11-23 DIAGNOSIS — M533 Sacrococcygeal disorders, not elsewhere classified: Secondary | ICD-10-CM | POA: Diagnosis not present

## 2021-11-23 DIAGNOSIS — H15123 Nodular episcleritis, bilateral: Secondary | ICD-10-CM

## 2021-11-23 DIAGNOSIS — M545 Low back pain, unspecified: Secondary | ICD-10-CM

## 2021-11-23 DIAGNOSIS — Z9049 Acquired absence of other specified parts of digestive tract: Secondary | ICD-10-CM

## 2021-11-23 DIAGNOSIS — Z8673 Personal history of transient ischemic attack (TIA), and cerebral infarction without residual deficits: Secondary | ICD-10-CM

## 2021-11-23 DIAGNOSIS — M19042 Primary osteoarthritis, left hand: Secondary | ICD-10-CM

## 2021-11-23 DIAGNOSIS — M19071 Primary osteoarthritis, right ankle and foot: Secondary | ICD-10-CM | POA: Diagnosis not present

## 2021-11-23 MED ORDER — TRIAMCINOLONE ACETONIDE 40 MG/ML IJ SUSP
40.0000 mg | INTRAMUSCULAR | Status: AC | PRN
  Administered 2021-11-23: 40 mg via INTRA_ARTICULAR

## 2021-11-23 MED ORDER — BACLOFEN 10 MG PO TABS: 10.0000 mg | ORAL_TABLET | Freq: Two times a day (BID) | ORAL | 2 refills | Status: DC | PRN

## 2021-11-23 MED ORDER — LIDOCAINE HCL 1 % IJ SOLN
1.0000 mL | INTRAMUSCULAR | Status: AC | PRN
  Administered 2021-11-23: 1 mL

## 2021-11-23 NOTE — Telephone Encounter (Signed)
Please apply for right knee euflexxa, per Hazel Sams, PA-C. Thanks!

## 2021-11-23 NOTE — Telephone Encounter (Signed)
VOB submitted for Euflexxa, right knee BV pending

## 2021-11-24 NOTE — Telephone Encounter (Signed)
Please call to schedule visco injections.  Approved for Euflexxa, Right knee(s). Buy & Bill Covered at 100% of the allowable amount. No co-pay Deductibles do not apply No pre-certifications

## 2021-12-05 ENCOUNTER — Ambulatory Visit (INDEPENDENT_AMBULATORY_CARE_PROVIDER_SITE_OTHER): Payer: 59

## 2021-12-05 DIAGNOSIS — R001 Bradycardia, unspecified: Secondary | ICD-10-CM

## 2021-12-07 LAB — CUP PACEART REMOTE DEVICE CHECK
Date Time Interrogation Session: 20230917230718
Implantable Pulse Generator Implant Date: 20210827

## 2021-12-19 ENCOUNTER — Ambulatory Visit: Payer: 59 | Attending: Physician Assistant | Admitting: Physician Assistant

## 2021-12-19 DIAGNOSIS — M1711 Unilateral primary osteoarthritis, right knee: Secondary | ICD-10-CM

## 2021-12-19 MED ORDER — SODIUM HYALURONATE (VISCOSUP) 20 MG/2ML IX SOSY
20.0000 mg | PREFILLED_SYRINGE | INTRA_ARTICULAR | Status: AC | PRN
  Administered 2021-12-19: 20 mg via INTRA_ARTICULAR

## 2021-12-19 MED ORDER — LIDOCAINE HCL 1 % IJ SOLN
1.5000 mL | INTRAMUSCULAR | Status: AC | PRN
  Administered 2021-12-19: 1.5 mL

## 2021-12-19 NOTE — Progress Notes (Signed)
Carelink Summary Report / Loop Recorder 

## 2021-12-19 NOTE — Progress Notes (Signed)
   Procedure Note  Patient: [name]             Date of Birth: 29-Jan-1966           MRN: [id number]             Visit Date: 12/19/2021  Procedures: Visit Diagnoses:  1. Primary osteoarthritis of right knee     Euflexxa #1 right knee, B/B Large Joint Inj: R knee on 12/19/2021 11:35 AM Indications: pain Details: 27 G 1.5 in needle, medial approach  Arthrogram: No  Medications: 1.5 mL lidocaine 1 %; 20 mg Sodium Hyaluronate (Viscosup) 20 MG/2ML Aspirate: 0 mL Outcome: tolerated well, no immediate complications Procedure, treatment alternatives, risks and benefits explained, specific risks discussed. Consent was given by the patient. Immediately prior to procedure a time out was called to verify the correct patient, procedure, equipment, support staff and site/side marked as required. Patient was prepped and draped in the usual sterile fashion.     Patient tolerated the procedure well.  Aftercare was discussed. Hazel Sams, PA-C

## 2021-12-26 ENCOUNTER — Ambulatory Visit: Payer: 59 | Attending: Physician Assistant | Admitting: Physician Assistant

## 2021-12-26 DIAGNOSIS — M1711 Unilateral primary osteoarthritis, right knee: Secondary | ICD-10-CM

## 2021-12-26 MED ORDER — SODIUM HYALURONATE (VISCOSUP) 20 MG/2ML IX SOSY
20.0000 mg | PREFILLED_SYRINGE | INTRA_ARTICULAR | Status: AC | PRN
  Administered 2021-12-26: 20 mg via INTRA_ARTICULAR

## 2021-12-26 MED ORDER — LIDOCAINE HCL 1 % IJ SOLN
1.5000 mL | INTRAMUSCULAR | Status: AC | PRN
  Administered 2021-12-26: 1.5 mL

## 2021-12-26 NOTE — Progress Notes (Signed)
   Procedure Note  Patient: [name]             Date of Birth: 03/26/1965           MRN: [id number]             Visit Date: 12/26/2021  Procedures: Visit Diagnoses:  1. Primary osteoarthritis of right knee    Euflexxa #2 right knee, B/B Large Joint Inj: R knee on 12/26/2021 1:19 PM Indications: pain Details: 27 G 1.5 in needle, medial approach  Arthrogram: No  Medications: 1.5 mL lidocaine 1 %; 20 mg Sodium Hyaluronate (Viscosup) 20 MG/2ML Aspirate: 0 mL Outcome: tolerated well, no immediate complications Procedure, treatment alternatives, risks and benefits explained, specific risks discussed. Consent was given by the patient. Immediately prior to procedure a time out was called to verify the correct patient, procedure, equipment, support staff and site/side marked as required. Patient was prepped and draped in the usual sterile fashion.     Patient tolerated the procedure well.  Aftercare was discussed.  Hazel Sams, PA-C

## 2022-01-02 ENCOUNTER — Ambulatory Visit: Payer: 59 | Attending: Physician Assistant | Admitting: Physician Assistant

## 2022-01-02 DIAGNOSIS — M1711 Unilateral primary osteoarthritis, right knee: Secondary | ICD-10-CM

## 2022-01-02 MED ORDER — LIDOCAINE HCL 1 % IJ SOLN
1.5000 mL | INTRAMUSCULAR | Status: AC | PRN
  Administered 2022-01-02: 1.5 mL

## 2022-01-02 MED ORDER — SODIUM HYALURONATE (VISCOSUP) 20 MG/2ML IX SOSY
20.0000 mg | PREFILLED_SYRINGE | INTRA_ARTICULAR | Status: AC | PRN
  Administered 2022-01-02: 20 mg via INTRA_ARTICULAR

## 2022-01-02 NOTE — Progress Notes (Signed)
   Procedure Note  Patient: [name]             Date of Birth: May 04, 1965           MRN: [id number]             Visit Date: 01/02/2022  Procedures: Visit Diagnoses:  1. Primary osteoarthritis of right knee    Euflexxa #3 right knee, B/B Large Joint Inj: R knee on 01/02/2022 2:52 PM Indications: pain Details: 27 G 1.5 in needle, medial approach  Arthrogram: No  Medications: 1.5 mL lidocaine 1 %; 20 mg Sodium Hyaluronate (Viscosup) 20 MG/2ML Aspirate: 0 mL Outcome: tolerated well, no immediate complications Procedure, treatment alternatives, risks and benefits explained, specific risks discussed. Consent was given by the patient. Immediately prior to procedure a time out was called to verify the correct patient, procedure, equipment, support staff and site/side marked as required. Patient was prepped and draped in the usual sterile fashion.    Patient tolerated the procedure well.  Aftercare was discussed.  Hazel Sams, PA-C

## 2022-01-09 ENCOUNTER — Ambulatory Visit (INDEPENDENT_AMBULATORY_CARE_PROVIDER_SITE_OTHER): Payer: 59

## 2022-01-09 DIAGNOSIS — R001 Bradycardia, unspecified: Secondary | ICD-10-CM

## 2022-01-10 LAB — CUP PACEART REMOTE DEVICE CHECK
Date Time Interrogation Session: 20231022231254
Implantable Pulse Generator Implant Date: 20210827

## 2022-02-03 NOTE — Progress Notes (Signed)
Carelink Summary Report / Loop Recorder 

## 2022-02-13 ENCOUNTER — Ambulatory Visit (INDEPENDENT_AMBULATORY_CARE_PROVIDER_SITE_OTHER): Payer: 59

## 2022-02-13 DIAGNOSIS — R001 Bradycardia, unspecified: Secondary | ICD-10-CM

## 2022-02-14 LAB — CUP PACEART REMOTE DEVICE CHECK
Date Time Interrogation Session: 20231126231131
Implantable Pulse Generator Implant Date: 20210827

## 2022-02-16 ENCOUNTER — Other Ambulatory Visit: Payer: Self-pay | Admitting: Physician Assistant

## 2022-02-16 DIAGNOSIS — G8929 Other chronic pain: Secondary | ICD-10-CM

## 2022-02-16 NOTE — Telephone Encounter (Signed)
Next Visit: 05/25/2022  Last Visit: 11/23/2021  Last Fill: 11/23/2021  Dx: Chronic bilateral low back pain without sciatica   Current Dose per office note on 11/23/2021: baclofen 10 mg 1 tablet daily as needed   Okay to refill Baclofen?

## 2022-03-16 ENCOUNTER — Ambulatory Visit (INDEPENDENT_AMBULATORY_CARE_PROVIDER_SITE_OTHER): Payer: 59

## 2022-03-16 DIAGNOSIS — G459 Transient cerebral ischemic attack, unspecified: Secondary | ICD-10-CM | POA: Diagnosis not present

## 2022-03-16 LAB — CUP PACEART REMOTE DEVICE CHECK
Date Time Interrogation Session: 20231227231002
Implantable Pulse Generator Implant Date: 20210827

## 2022-03-27 NOTE — Progress Notes (Signed)
Carelink Summary Report / Loop Recorder 

## 2022-04-04 NOTE — Progress Notes (Signed)
[facility] Summary Report / Loop Recorder

## 2022-04-18 ENCOUNTER — Ambulatory Visit: Payer: 59

## 2022-04-18 DIAGNOSIS — G459 Transient cerebral ischemic attack, unspecified: Secondary | ICD-10-CM

## 2022-04-19 LAB — CUP PACEART REMOTE DEVICE CHECK
Date Time Interrogation Session: 20240127230226
Implantable Pulse Generator Implant Date: 20210827

## 2022-05-11 NOTE — Progress Notes (Signed)
Office Visit Note  Patient: [name]             Date of Birth: 1965-08-11           MRN: [id number]             PCP: Pablo Lawrence, NP Referring: Pablo Lawrence, NP Visit Date: 05/25/2022 Occupation: '@GUAROCC'$ @  Subjective:  Right knee pain and lower back pain  History of Present Illness: [name] is a 57 y.o. female with history of nodular scleritis, osteoarthritis and degenerative disc disease.  She states that yesterday she was going down the stairs and fell on her back.  She has been having some lower back discomfort.  She denies any radiculopathy.  She states her SI joint pain improved after the cortisone injection.  She has intermittent discomfort in the SI joints.  Her right knee joint continues to hurt.  She is unable to extend her right knee joint.  She has been taking Tylenol which helps to some extent.  She requested a cortisone injection to her right knee joint.  None of the other joints are painful or swollen.  She is considering her right total knee replacement sometimes later this year.  Patient was recently seen by her ophthalmologist.  She states she did not have any recurrence of episcleritis but she was diagnosed with dry eyes.  Lacrimal blocking was advised.    Activities of Daily Living:  Patient reports morning stiffness for 1 hour.   Patient Reports nocturnal pain.  Difficulty dressing/grooming: Denies Difficulty climbing stairs: Reports Difficulty getting out of chair: Reports Difficulty using hands for taps, buttons, cutlery, and/or writing: Reports  Review of Systems  Constitutional:  Positive for fatigue.  HENT:  Positive for mouth dryness. Negative for mouth sores.   Eyes:  Positive for dryness.  Respiratory: Negative.  Negative for shortness of breath.   Cardiovascular:  Negative for chest pain.  Gastrointestinal: Negative.  Negative for blood in stool, constipation and diarrhea.  Endocrine: Negative.  Negative for increased urination.   Genitourinary: Negative.  Negative for involuntary urination.  Musculoskeletal:  Positive for joint pain, joint pain, joint swelling, myalgias, muscle weakness, morning stiffness, muscle tenderness and myalgias. Negative for gait problem.  Skin: Negative.  Negative for color change, rash, hair loss and sensitivity to sunlight.  Allergic/Immunologic: Negative.  Negative for susceptible to infections.  Neurological: Negative.  Negative for dizziness and headaches.  Hematological: Negative.  Negative for swollen glands.  Psychiatric/Behavioral:  Positive for sleep disturbance. Negative for depressed mood. The patient is nervous/anxious.     PMFS History:  Patient Active Problem List   Diagnosis Date Noted   Hyperlipemia 05/12/2021   Chest pain of uncertain etiology 99991111   Palpitation 04/22/2019   Fatigue 04/22/2019   Menopause 04/22/2019   Vitamin D deficiency disease 11/28/2018   Abdominal pain 03/21/2018   Nausea with vomiting 03/21/2018   Primary osteoarthritis of both knees 09/24/2017   Osteoarthritis of both knees 01/19/2016   Osteoarthritis of both feet 01/19/2016   History of TIA (transient ischemic attack) 01/19/2016   DDD (degenerative disc disease), lumbar 01/19/2016   Nodular episcleritis of both eyes 01/19/2016   Pain in right knee 01/19/2016   Leiomyoma 10/12/2011   Female pelvic pain 10/12/2011    Past Medical History:  Diagnosis Date   DDD (degenerative disc disease), lumbar 01/19/2016   History of TIA (transient ischemic attack) 01/19/2016   Hypothyroidism    Osteoarthritis of both feet 01/19/2016  Osteoarthritis of both knees 01/19/2016   PONV (postoperative nausea and vomiting)    Stroke ([place]) 07/2010   "TIA" per patient   Stroke ([place]) 2018   TIA   Vitamin D deficiency disease 11/28/2018    Family History  Problem Relation Age of Onset   Anuerysm Mother    Heart attack Mother    Heart disease Mother    Diabetes Father    Hypertension Father     Macular degeneration Father    Diabetes Sister    Osteoarthritis Sister    Fibromyalgia Sister    Colon cancer Paternal Uncle    Past Surgical History:  Procedure Laterality Date   ABDOMINAL HYSTERECTOMY  2013   1 OVARY LEFT-BENIGN TUMOR   BIOPSY  05/15/2018   Procedure: BIOPSY;  Surgeon: Daneil Dolin, MD;  [facility];  Service: Endoscopy;;  polyp cb   CHOLECYSTECTOMY     COLONOSCOPY N/A 05/15/2018   Procedure: COLONOSCOPY;  Surgeon: Daneil Dolin, MD;  [facility];  Service: Endoscopy;  Laterality: N/A;  12:00pm   EYE SURGERY     implantable loop recorder placement  11/15/19   Medtronic Reveal Linq model LNQ 22 ([place] [id number])  implantable loop recorder by Dr Rayann Heman   KNEE ARTHROSCOPY     x2   LAPAROSCOPIC ASSISTED VAGINAL HYSTERECTOMY  10/11/2011   Procedure: LAPAROSCOPIC ASSISTED VAGINAL HYSTERECTOMY;  Surgeon: Margarette Asal, MD;  Location: [facility];  Service: Gynecology;  Laterality: N/A;   LASIK     POLYPECTOMY  05/15/2018   Procedure: POLYPECTOMY;  Surgeon: Daneil Dolin, MD;  [facility];  Service: Endoscopy;;  colon    Social History   Social History Narrative   Lives with husband of 2 years(second)  and son/family.Works in Musician for a long time. Has 69 son 43 years old. Divorced x 1(previously married for 25 yrs), remarried <1. Has 2 dogs.   Immunization History  Administered Date(s) Administered   Influenza Inj Mdck Quad Pf 01/26/2020   Influenza,inj,Quad PF,6+ Mos 02/27/2019   Influenza-Unspecified 02/27/2019   Tdap 08/24/2017     Objective: Vital Signs: BP 114/78 (BP Location: Left Arm, Patient Position: Sitting, Cuff Size: Normal)   Pulse (!) 54   Resp 16   Ht '5\' 2"'$  (1.575 m)   Wt 169 lb 3.2 oz (76.7 kg)   LMP 01/09/2011   BMI 30.95 kg/m    Physical Exam Vitals and nursing note reviewed.  Constitutional:      Appearance: She is well-developed.  HENT:     Head: Normocephalic and atraumatic.  Eyes:      Conjunctiva/sclera: Conjunctivae normal.  Cardiovascular:     Rate and Rhythm: Normal rate and regular rhythm.     Heart sounds: Normal heart sounds.  Pulmonary:     Effort: Pulmonary effort is normal.     Breath sounds: Normal breath sounds.  Abdominal:     General: Bowel sounds are normal.     Palpations: Abdomen is soft.  Musculoskeletal:     Cervical back: Normal range of motion.  Lymphadenopathy:     Cervical: No cervical adenopathy.  Skin:    General: Skin is warm and dry.     Capillary Refill: Capillary refill takes less than 2 seconds.  Neurological:     Mental Status: She is alert and oriented to person, place, and time.  Psychiatric:        Behavior: Behavior normal.      Musculoskeletal  Exam: Cervical spine was in good range of motion.  She has some discomfort range of motion of her lumbar spine without any point tenderness.  She had no SI joint tenderness.  Shoulder joints, elbow joints, wrist joints, MCPs PIPs and DIPs Juengel range of motion with no synovitis.  Hip joints in good range of motion.  Right knee joint was warm and had limited extension.  Left knee joint was in good range of motion.  There was no tenderness over ankles or MTPs.  CDAI Exam: CDAI Score: -- Patient Global: --; Provider Global: -- Swollen: --; Tender: -- Joint Exam 05/25/2022   No joint exam has been documented for this visit   There is currently no information documented on the homunculus. Go to the Rheumatology activity and complete the homunculus joint exam.  Investigation: No additional findings.  Imaging: CUP PACEART REMOTE DEVICE CHECK  Result Date: 05/17/2022 ILR summary report received. Battery status OK. Normal device function. No new tachy, brady, or pause episodes. No new AF episodes. Monthly summary reports and ROV/PRN 1 symptom activation, no associated arrhythmia, no annotation LA   Recent Labs: Lab Results  Component Value Date   WBC 6.8 01/26/2020   HGB 13.4  01/26/2020   PLT 280 01/26/2020   NA 144 01/26/2020   K 4.1 01/26/2020   CL 104 01/26/2020   CO2 32 01/26/2020   GLUCOSE 101 (H) 01/26/2020   BUN 16 01/26/2020   CREATININE 0.56 01/26/2020   BILITOT 1.0 01/26/2020   ALKPHOS 50 07/13/2016   AST 11 01/26/2020   ALT 10 01/26/2020   PROT 7.0 01/26/2020   ALBUMIN 3.8 07/13/2016   CALCIUM 9.8 01/26/2020   GFRAA 124 01/26/2020    Speciality Comments: No specialty comments available.  Procedures:  Large Joint Inj: R knee on 05/25/2022 10:19 AM Indications: pain Details: 27 G 1.5 in needle, medial approach  Arthrogram: No  Medications: 40 mg triamcinolone acetonide 40 MG/ML; 1.5 mL lidocaine 1 % Aspirate: 0 mL Outcome: tolerated well, no immediate complications Procedure, treatment alternatives, risks and benefits explained, specific risks discussed. Consent was given by the patient. Immediately prior to procedure a time out was called to verify the correct patient, procedure, equipment, support staff and site/side marked as required. Patient was prepped and draped in the usual sterile fashion.     Allergies: Celebrex [celecoxib]   Assessment / Plan:     Visit Diagnoses: Nodular episcleritis of both eyes-patient denies any recurrence of episcleritis.  She was evaluated by her ophthalmologist recently.  She states she was diagnosed with dry eyes.  Lacrimal plugging was advised.  She has been using eyedrops.  No conjunctival injection was noted on the examination today.  Primary osteoarthritis of both hands -she continues to have some stiffness in her hands.  No synovitis was noted.  Joint protection was discussed.  05/12/20: CRP WNL, ESR WNL, RF-, anti-CCP-, 14-3-3 eta negative. X-rays updated 04/2320 consistent with early osteoarthritic changes.  No erosive changes noted.  Primary osteoarthritis of both knees - s/p euflexxa right knee 11/2020.  She had some relief from the Euflexxa injection.  Today she presents with warmth and swelling  in her right knee joint.  She has limited extension.  I reviewed her x-rays from November 23, 2021 which showed severe lateral compartment narrowing and chondromalacia patella.  She would benefit from total knee replacement.  She was having all discomfort today and per request we decided to proceed with cortisone injection.  The procedure is described  above.  Patient tolerated the procedure well.  Postprocedure instructions were given.  I gave her a handout on lower extremity muscle strengthening exercises.  Patient is traveling in June.  She will contact [place] after her travels regarding her referral to orthopedic surgeon for total knee replacement.  Primary osteoarthritis of both feet-proper fitting shoes were advised.  History of recent fall-patient states that she slipped on the stairs yesterday it was dark and she was holding objects in her both hands.  She had some discomfort in her back but no major injuries.  DDD (degenerative disc disease), lumbar -she has chronic discomfort in her lower back.  She had good mobility on the examination today.  Mild levoscoliosis and disc disease on the lumbar spine x-rays obtained on May 14, 2019.  Facet joint arthropathy  Chronic SI joint pain-she has off-and-on discomfort in her SI joints.  She had good response to cortisone injections in September 2023.  Currently the pain is manageable.  Stretching exercises were advised.  History of cholecystectomy  History of TIA (transient ischemic attack)  Orders: Orders Placed This Encounter  Procedures   Large Joint Inj   No orders of the defined types were placed in this encounter.    Follow-Up Instructions: Return in about 6 months (around 11/25/2022) for episcleritis, OA.   Bo Merino, MD  Note - This record has been created using Editor, commissioning.  Chart creation errors have been sought, but may not always  have been located. Such creation errors do not reflect on  the standard of medical  care.

## 2022-05-12 ENCOUNTER — Other Ambulatory Visit: Payer: Self-pay | Admitting: Physician Assistant

## 2022-05-12 DIAGNOSIS — G8929 Other chronic pain: Secondary | ICD-10-CM

## 2022-05-12 NOTE — Telephone Encounter (Signed)
Next Visit: 05/25/2022   Last Visit: 11/23/2021   Last Fill: 02/16/2022   Dx: Chronic bilateral low back pain without sciatica    Current Dose per office note on 11/23/2021: baclofen 10 mg 1 tablet daily as needed    Okay to refill Baclofen?

## 2022-05-15 NOTE — Progress Notes (Signed)
Carelink Summary Report / Loop Recorder 

## 2022-05-17 ENCOUNTER — Ambulatory Visit: Payer: 59

## 2022-05-17 DIAGNOSIS — G459 Transient cerebral ischemic attack, unspecified: Secondary | ICD-10-CM

## 2022-05-17 LAB — CUP PACEART REMOTE DEVICE CHECK
Date Time Interrogation Session: 20240227230646
Implantable Pulse Generator Implant Date: 20210827

## 2022-05-18 DIAGNOSIS — G459 Transient cerebral ischemic attack, unspecified: Secondary | ICD-10-CM | POA: Diagnosis not present

## 2022-05-22 ENCOUNTER — Ambulatory Visit: Payer: 59

## 2022-05-25 ENCOUNTER — Ambulatory Visit: Payer: 59 | Attending: Rheumatology | Admitting: Rheumatology

## 2022-05-25 ENCOUNTER — Encounter: Payer: Self-pay | Admitting: Rheumatology

## 2022-05-25 VITALS — BP 114/78 | HR 54 | Resp 16 | Ht 62.0 in | Wt 169.2 lb

## 2022-05-25 DIAGNOSIS — M19041 Primary osteoarthritis, right hand: Secondary | ICD-10-CM

## 2022-05-25 DIAGNOSIS — G8929 Other chronic pain: Secondary | ICD-10-CM | POA: Diagnosis not present

## 2022-05-25 DIAGNOSIS — M170 Bilateral primary osteoarthritis of knee: Secondary | ICD-10-CM | POA: Diagnosis not present

## 2022-05-25 DIAGNOSIS — H15123 Nodular episcleritis, bilateral: Secondary | ICD-10-CM | POA: Diagnosis not present

## 2022-05-25 DIAGNOSIS — M25561 Pain in right knee: Secondary | ICD-10-CM | POA: Diagnosis not present

## 2022-05-25 DIAGNOSIS — M19071 Primary osteoarthritis, right ankle and foot: Secondary | ICD-10-CM | POA: Diagnosis not present

## 2022-05-25 DIAGNOSIS — M19072 Primary osteoarthritis, left ankle and foot: Secondary | ICD-10-CM

## 2022-05-25 DIAGNOSIS — M533 Sacrococcygeal disorders, not elsewhere classified: Secondary | ICD-10-CM

## 2022-05-25 DIAGNOSIS — Z9049 Acquired absence of other specified parts of digestive tract: Secondary | ICD-10-CM

## 2022-05-25 DIAGNOSIS — M5136 Other intervertebral disc degeneration, lumbar region: Secondary | ICD-10-CM

## 2022-05-25 DIAGNOSIS — Z8673 Personal history of transient ischemic attack (TIA), and cerebral infarction without residual deficits: Secondary | ICD-10-CM

## 2022-05-25 DIAGNOSIS — Z9181 History of falling: Secondary | ICD-10-CM

## 2022-05-25 DIAGNOSIS — M19042 Primary osteoarthritis, left hand: Secondary | ICD-10-CM

## 2022-05-25 MED ORDER — LIDOCAINE HCL 1 % IJ SOLN
1.5000 mL | INTRAMUSCULAR | Status: AC | PRN
  Administered 2022-05-25: 1.5 mL

## 2022-05-25 MED ORDER — TRIAMCINOLONE ACETONIDE 40 MG/ML IJ SUSP
40.0000 mg | INTRAMUSCULAR | Status: AC | PRN
  Administered 2022-05-25: 40 mg via INTRA_ARTICULAR

## 2022-05-25 NOTE — Patient Instructions (Signed)

## 2022-06-19 ENCOUNTER — Ambulatory Visit (INDEPENDENT_AMBULATORY_CARE_PROVIDER_SITE_OTHER): Payer: 59

## 2022-06-19 DIAGNOSIS — G459 Transient cerebral ischemic attack, unspecified: Secondary | ICD-10-CM

## 2022-06-20 LAB — CUP PACEART REMOTE DEVICE CHECK
Date Time Interrogation Session: 20240331231632
Implantable Pulse Generator Implant Date: 20210827

## 2022-06-20 NOTE — Progress Notes (Signed)
Carelink Summary Report / Loop Recorder 

## 2022-06-26 ENCOUNTER — Ambulatory Visit: Payer: 59

## 2022-07-11 NOTE — Progress Notes (Unsigned)
Office Visit Note  Patient: [name]             Date of Birth: Jun 03, 1965           MRN: [id number]             PCP: Roe Rutherford, NP Referring: Roe Rutherford, NP Visit Date: 07/12/2022 Occupation: @  Subjective:  Left trochanteric bursitis   History of Present Illness: [name] is a 57 y.o. female with history of episcleritis and osteoarthritis.   Baclofen     Activities of Daily Living:  Patient reports morning stiffness for *** {minute/hour:19697}.   Patient {ACTIONS;DENIES/REPORTS:21021675::"Denies"} nocturnal pain.  Difficulty dressing/grooming: {ACTIONS;DENIES/REPORTS:21021675::"Denies"} Difficulty climbing stairs: {ACTIONS;DENIES/REPORTS:21021675::"Denies"} Difficulty getting out of chair: {ACTIONS;DENIES/REPORTS:21021675::"Denies"} Difficulty using hands for taps, buttons, cutlery, and/or writing: {ACTIONS;DENIES/REPORTS:21021675::"Denies"}  No Rheumatology ROS completed.   PMFS History:  Patient Active Problem List   Diagnosis Date Noted   Hyperlipemia 05/12/2021   Chest pain of uncertain etiology 05/12/2021   Palpitation 04/22/2019   Fatigue 04/22/2019   Menopause 04/22/2019   Vitamin D deficiency disease 11/28/2018   Abdominal pain 03/21/2018   Nausea with vomiting 03/21/2018   Primary osteoarthritis of both knees 09/24/2017   Osteoarthritis of both knees 01/19/2016   Osteoarthritis of both feet 01/19/2016   History of TIA (transient ischemic attack) 01/19/2016   DDD (degenerative disc disease), lumbar 01/19/2016   Nodular episcleritis of both eyes 01/19/2016   Pain in right knee 01/19/2016   Leiomyoma 10/12/2011   Female pelvic pain 10/12/2011    Past Medical History:  Diagnosis Date   DDD (degenerative disc disease), lumbar 01/19/2016   History of TIA (transient ischemic attack) 01/19/2016   Hypothyroidism    Osteoarthritis of both feet 01/19/2016   Osteoarthritis of both knees 01/19/2016   PONV (postoperative nausea  and vomiting)    Stroke ([facility]) 07/2010   "TIA" per patient   Stroke ([facility]) 2018   TIA   Vitamin D deficiency disease 11/28/2018    Family History  Problem Relation Age of Onset   Anuerysm Mother    Heart attack Mother    Heart disease Mother    Diabetes Father    Hypertension Father    Macular degeneration Father    Diabetes Sister    Osteoarthritis Sister    Fibromyalgia Sister    Colon cancer Paternal Uncle    Past Surgical History:  Procedure Laterality Date   ABDOMINAL HYSTERECTOMY  2013   1 OVARY LEFT-BENIGN TUMOR   BIOPSY  05/15/2018   Procedure: BIOPSY;  Surgeon: Corbin Ade, MD;  [facility];  Service: Endoscopy;;  polyp cb   CHOLECYSTECTOMY     COLONOSCOPY N/A 05/15/2018   Procedure: COLONOSCOPY;  Surgeon: Corbin Ade, MD;  [facility];  Service: Endoscopy;  Laterality: N/A;  12:00pm   EYE SURGERY     implantable loop recorder placement  11/15/19   Medtronic Reveal Linq model LNQ 22 ([place] [id number])  implantable loop recorder by Dr Johney Frame   KNEE ARTHROSCOPY     x2   LAPAROSCOPIC ASSISTED VAGINAL HYSTERECTOMY  10/11/2011   Procedure: LAPAROSCOPIC ASSISTED VAGINAL HYSTERECTOMY;  Surgeon: Meriel Pica, MD;  Location: [facility];  Service: Gynecology;  Laterality: N/A;   LASIK     POLYPECTOMY  05/15/2018   Procedure: POLYPECTOMY;  Surgeon: Corbin Ade, MD;  [facility];  Service: Endoscopy;;  colon    Social History   Social History Narrative   Lives  with husband of 2 years(second)  and son/family.Works in Psychologist, forensic for a long time. Has 59 son 56 years old. Divorced x 1(previously married for 25 yrs), remarried <1. Has 2 dogs.   Immunization History  Administered Date(s) Administered   Influenza Inj Mdck Quad Pf 01/26/2020   Influenza,inj,Quad PF,6+ Mos 02/27/2019   Influenza-Unspecified 02/27/2019   Tdap 08/24/2017     Objective: Vital Signs: LMP 01/09/2011    Physical Exam Vitals and nursing note reviewed.   Constitutional:      Appearance: She is well-developed.  HENT:     Head: Normocephalic and atraumatic.  Eyes:     Conjunctiva/sclera: Conjunctivae normal.  Cardiovascular:     Rate and Rhythm: Normal rate and regular rhythm.     Heart sounds: Normal heart sounds.  Pulmonary:     Effort: Pulmonary effort is normal.     Breath sounds: Normal breath sounds.  Abdominal:     General: Bowel sounds are normal.     Palpations: Abdomen is soft.  Musculoskeletal:     Cervical back: Normal range of motion.  Lymphadenopathy:     Cervical: No cervical adenopathy.  Skin:    General: Skin is warm and dry.     Capillary Refill: Capillary refill takes less than 2 seconds.  Neurological:     Mental Status: She is alert and oriented to person, place, and time.  Psychiatric:        Behavior: Behavior normal.      Musculoskeletal Exam: ***  CDAI Exam: CDAI Score: -- Patient Global: --; Provider Global: -- Swollen: --; Tender: -- Joint Exam 07/12/2022   No joint exam has been documented for this visit   There is currently no information documented on the homunculus. Go to the Rheumatology activity and complete the homunculus joint exam.  Investigation: No additional findings.  Imaging: CUP PACEART REMOTE DEVICE CHECK  Result Date: 06/20/2022 ILR summary report received. Battery status OK. Normal device function. No new tachy, brady, or pause episodes. No new AF episodes. Monthly summary reports and ROV/PRN 2 symptom activation's, no annotation, no associated arrhythmia LA, CVRS   Recent Labs: Lab Results  Component Value Date   WBC 6.8 01/26/2020   HGB 13.4 01/26/2020   PLT 280 01/26/2020   NA 144 01/26/2020   K 4.1 01/26/2020   CL 104 01/26/2020   CO2 32 01/26/2020   GLUCOSE 101 (H) 01/26/2020   BUN 16 01/26/2020   CREATININE 0.56 01/26/2020   BILITOT 1.0 01/26/2020   ALKPHOS 50 07/13/2016   AST 11 01/26/2020   ALT 10 01/26/2020   PROT 7.0 01/26/2020   ALBUMIN 3.8  07/13/2016   CALCIUM 9.8 01/26/2020   GFRAA 124 01/26/2020    Speciality Comments: No specialty comments available.  Procedures:  No procedures performed Allergies: Celebrex [celecoxib]   Assessment / Plan:     Visit Diagnoses: No diagnosis found.  Orders: No orders of the defined types were placed in this encounter.  No orders of the defined types were placed in this encounter.   Face-to-face time spent with patient was *** minutes. Greater than 50% of time was spent in counseling and coordination of care.  Follow-Up Instructions: No follow-ups on file.   Ellen Henri, CMA  Note - This record has been created using Animal nutritionist.  Chart creation errors have been sought, but may not always  have been located. Such creation errors do not reflect on  the standard of medical care.

## 2022-07-12 ENCOUNTER — Encounter: Payer: Self-pay | Admitting: Physician Assistant

## 2022-07-12 ENCOUNTER — Ambulatory Visit: Payer: 59 | Attending: Physician Assistant | Admitting: Physician Assistant

## 2022-07-12 VITALS — BP 140/87 | HR 77 | Resp 15 | Ht 62.0 in | Wt 169.4 lb

## 2022-07-12 DIAGNOSIS — M170 Bilateral primary osteoarthritis of knee: Secondary | ICD-10-CM | POA: Diagnosis not present

## 2022-07-12 DIAGNOSIS — M19041 Primary osteoarthritis, right hand: Secondary | ICD-10-CM | POA: Diagnosis not present

## 2022-07-12 DIAGNOSIS — H15123 Nodular episcleritis, bilateral: Secondary | ICD-10-CM | POA: Diagnosis not present

## 2022-07-12 DIAGNOSIS — M19071 Primary osteoarthritis, right ankle and foot: Secondary | ICD-10-CM

## 2022-07-12 DIAGNOSIS — Z8673 Personal history of transient ischemic attack (TIA), and cerebral infarction without residual deficits: Secondary | ICD-10-CM

## 2022-07-12 DIAGNOSIS — M5136 Other intervertebral disc degeneration, lumbar region: Secondary | ICD-10-CM

## 2022-07-12 DIAGNOSIS — G8929 Other chronic pain: Secondary | ICD-10-CM | POA: Diagnosis not present

## 2022-07-12 DIAGNOSIS — M533 Sacrococcygeal disorders, not elsewhere classified: Secondary | ICD-10-CM | POA: Diagnosis not present

## 2022-07-12 DIAGNOSIS — Z9181 History of falling: Secondary | ICD-10-CM

## 2022-07-12 DIAGNOSIS — M19042 Primary osteoarthritis, left hand: Secondary | ICD-10-CM

## 2022-07-12 DIAGNOSIS — M19072 Primary osteoarthritis, left ankle and foot: Secondary | ICD-10-CM

## 2022-07-12 DIAGNOSIS — Z9049 Acquired absence of other specified parts of digestive tract: Secondary | ICD-10-CM

## 2022-07-12 MED ORDER — LIDOCAINE HCL 1 % IJ SOLN
1.0000 mL | INTRAMUSCULAR | Status: AC | PRN
Start: 2022-07-12 — End: 2022-07-12
  Administered 2022-07-12: 1 mL

## 2022-07-12 MED ORDER — TRIAMCINOLONE ACETONIDE 40 MG/ML IJ SUSP
40.0000 mg | INTRAMUSCULAR | Status: AC | PRN
Start: 2022-07-12 — End: 2022-07-12
  Administered 2022-07-12: 40 mg via INTRA_ARTICULAR

## 2022-07-20 LAB — CUP PACEART REMOTE DEVICE CHECK
Date Time Interrogation Session: 20240501230443
Implantable Pulse Generator Implant Date: 20210827

## 2022-07-24 ENCOUNTER — Ambulatory Visit (INDEPENDENT_AMBULATORY_CARE_PROVIDER_SITE_OTHER): Payer: 59

## 2022-07-24 DIAGNOSIS — R001 Bradycardia, unspecified: Secondary | ICD-10-CM

## 2022-07-26 NOTE — Progress Notes (Signed)
Carelink Summary Report / Loop Recorder 

## 2022-07-31 ENCOUNTER — Ambulatory Visit: Payer: 59

## 2022-08-22 NOTE — Progress Notes (Signed)
Carelink Summary Report / Loop Recorder 

## 2022-08-23 LAB — CUP PACEART REMOTE DEVICE CHECK
Date Time Interrogation Session: 20240601230025
Implantable Pulse Generator Implant Date: 20210827

## 2022-08-28 ENCOUNTER — Ambulatory Visit (INDEPENDENT_AMBULATORY_CARE_PROVIDER_SITE_OTHER): Payer: 59

## 2022-08-28 DIAGNOSIS — R001 Bradycardia, unspecified: Secondary | ICD-10-CM

## 2022-09-19 ENCOUNTER — Ambulatory Visit (INDEPENDENT_AMBULATORY_CARE_PROVIDER_SITE_OTHER): Payer: 59

## 2022-09-19 DIAGNOSIS — G459 Transient cerebral ischemic attack, unspecified: Secondary | ICD-10-CM

## 2022-09-19 NOTE — Progress Notes (Signed)
Carelink Summary Report / Loop Recorder 

## 2022-09-20 LAB — CUP PACEART REMOTE DEVICE CHECK
Date Time Interrogation Session: 20240702230931
Implantable Pulse Generator Implant Date: 20210827

## 2022-09-26 ENCOUNTER — Telehealth: Payer: Self-pay

## 2022-09-26 NOTE — Telephone Encounter (Signed)
   Pre-operative Risk Assessment    Patient Name: [name]  DOB: 08-Mar-1966 MRN: [id number]      Request for Surgical Clearance    Procedure:   RIGHT TOTAL KNEE ARTHROPLASTY  Date of Surgery:  Clearance 12/12/22                                 Surgeon:  DR. MATTHEW OLIN Surgeon's Group or Practice Name:  [facility] Phone number:  [phone] Madlyn Frankel Fax number:  [phone]   Type of Clearance Requested:   - Medical    Type of Anesthesia:  Spinal   Additional requests/questions:    SignedMichaelle Copas   09/26/2022, 3:45 PM

## 2022-09-27 NOTE — Telephone Encounter (Signed)
Pt has been scheduled to see Eligha Bridegroom, NP, 11/03/22, clearance will be addressed at that time.  Will route back to the requesting surgeon's office to make them aware.

## 2022-09-27 NOTE — Telephone Encounter (Signed)
   Name: [name]  DOB: Mar 26, 1965  MRN: [id number]  Primary Cardiologist: Dina Rich, MD  Chart reviewed as part of pre-operative protocol coverage. Because of [name]'s past medical history and time since last visit, she will require a follow-up in-office visit in order to better assess preoperative cardiovascular risk.  Pre-op covering staff: - Please schedule appointment and call patient to inform them. If patient already had an upcoming appointment within acceptable timeframe, please add "pre-op clearance" to the appointment notes so provider is aware. - Please contact requesting surgeon's office via preferred method (i.e, phone, fax) to inform them of need for appointment prior to surgery.  No medications indicated as needing held. Paula Dory, PA-C  09/27/2022, 7:52 AM

## 2022-10-02 ENCOUNTER — Ambulatory Visit: Payer: 59

## 2022-10-03 DIAGNOSIS — G459 Transient cerebral ischemic attack, unspecified: Secondary | ICD-10-CM

## 2022-10-13 NOTE — Progress Notes (Signed)
Carelink Summary Report / Loop Recorder 

## 2022-10-16 ENCOUNTER — Telehealth: Payer: Self-pay

## 2022-10-16 NOTE — Telephone Encounter (Signed)
Following alert received from [employer] received for a symptom event that showed a short atrial tachycardia arrhythmia w/ unknown duration.   Patient reports she was walking with her husband during this time but not strenuous. Reports of lightheaded during symptom. No BB on file. Pt advised to call with further symptoms, voiced understanding.  Routing to Dr. Nelly Laurence to advise.

## 2022-10-23 ENCOUNTER — Ambulatory Visit: Payer: 59

## 2022-10-23 DIAGNOSIS — R001 Bradycardia, unspecified: Secondary | ICD-10-CM

## 2022-10-27 ENCOUNTER — Other Ambulatory Visit: Payer: Self-pay

## 2022-10-27 ENCOUNTER — Ambulatory Visit: Payer: 59 | Attending: Cardiovascular Disease | Admitting: Cardiovascular Disease

## 2022-10-27 ENCOUNTER — Encounter: Payer: Self-pay | Admitting: Cardiovascular Disease

## 2022-10-27 ENCOUNTER — Telehealth: Payer: Self-pay | Admitting: Cardiovascular Disease

## 2022-10-27 VITALS — BP 120/76 | HR 71 | Ht 62.0 in | Wt 174.6 lb

## 2022-10-27 DIAGNOSIS — R002 Palpitations: Secondary | ICD-10-CM

## 2022-10-27 DIAGNOSIS — R001 Bradycardia, unspecified: Secondary | ICD-10-CM | POA: Diagnosis not present

## 2022-10-27 NOTE — Patient Instructions (Signed)

## 2022-10-27 NOTE — Progress Notes (Signed)
  Electrophysiology Office Note:    Date:  10/27/2022   ID:  [name], DOB [birth year], MRN [id number]  PCP:  Roe Rutherford, NP   [facility] Providers Cardiologist:  Dina Rich, MD Electrophysiologist:  Maurice Small, MD     Referring MD: Roe Rutherford, NP   History of Present Illness:    [name] is a 57 y.o. female with a medical history significant for TIA, arthritis, who presents for routine electrophysiology follow-up.     She has a Medtronic loop recorder in place due to palpitations.  She has had episodes of nonsustained atrial tachycardia and premature atrial contractions but no otherwise clinically significant arrhythmia.  She is very symptomatic with these episodes.  One occurred on October 13, 2022, captured on her loop recorder.  It correlated with an atrial tachycardia that ramped up to a rate of over 200 bpm but lasted no more than about 5 seconds.  She has not been able to tolerate rate slowing medications due to history of baseline low heart rates.  She reports feeling better overall since starting phentermine.      Today, she reports that she is doing well.  She has no complaints.  No palpitations since July.   EKGs/Labs/Other Studies Reviewed Today:    Echocardiogram:  TTE 11/14/2017 EF 60 to 65%; normal structure and function   Monitors:   Stress testing:  ETT 05/17/2021 No ischemia, no arrhythmia. 7 mets achieved  Advanced imaging:  Cardiac CT 2020   Cardiac catherization    EKG:   EKG Interpretation Date/Time:  Friday October 27 2022 08:26:12 EDT Ventricular Rate:  71 PR Interval:  116 QRS Duration:  86 QT Interval:  380 QTC Calculation: 412 R Axis:   84  Text Interpretation: Normal sinus rhythm Normal ECG No previous ECGs available Confirmed by York Pellant [phone]) on 10/27/2022 8:50:40 AM     Physical Exam:    VS:  BP 120/76 (BP Location: Left Arm, Patient Position: Sitting, Cuff Size: Large)    Pulse 71   Ht 5\' 2"  (1.575 m)   Wt 174 lb 9.6 oz (79.2 kg)   LMP 01/09/2011   SpO2 99%   BMI 31.93 kg/m     Wt Readings from Last 3 Encounters:  10/27/22 174 lb 9.6 oz (79.2 kg)  07/12/22 169 lb 6.4 oz (76.8 kg)  05/25/22 169 lb 3.2 oz (76.7 kg)     GEN: Well nourished, well developed in no acute distress CARDIAC: RRR, no murmurs, rubs, gallops RESPIRATORY:  Normal work of breathing MUSCULOSKELETAL: no edema    ASSESSMENT & PLAN:    History of PACs, Non-sustained AT Episodes are very brief and rare However, she is very symptomatic She is reluctant to take any medications that may slow her rate I do not think the risk of antiarrhythmic medications would be worth the benefit  Medtronic ILR in place Continue monitoring  Preoperative evaluation - TKA She does not have any relevant cardiac history -- no CAD, no CHF  She is able to climb 2 flights of stairs She is low risk for TKA  History of TIA Monitoring with loop recorder in place     Signed, Maurice Small, MD  10/27/2022 8:53 AM    [facility]

## 2022-10-27 NOTE — Telephone Encounter (Signed)
Pt has an PreOp Clearance appt w/ [facility] on 8/16 but was calling to cancel due to her stating at her appt this morning with Mealor she was told he'd sign off on it so she doesn't need the appt. Informed pt I didn't see any notes regarding that so I would let her speak with the nurse about canceling. Please advise

## 2022-10-30 NOTE — Telephone Encounter (Signed)
Patient aware that she will not need appointment on 8/16. I have cancelled appointment

## 2022-10-30 NOTE — Progress Notes (Deleted)
  Cardiology Office Note:  .   Date:  10/30/2022  ID:  [name], DOB Mar 06, 1966, MRN [id number] PCP: Roe Rutherford, NP  [facility] Providers Cardiologist:  Dina Rich, MD Electrophysiologist:  Maurice Small, MD { Click to update primary MD,subspecialty MD or APP then REFRESH:1}   Patient Profile: .      Past medical history Palpitations Cardiac monitor 01/2018 Sinus rhythm, sinus bradycardia, sinus tachycardia Symptoms associated with SB No significant arrhythmias ILR implant 11/14/2019 Bradycardia No evidence of chronotropic incompetence on ETT 11/2017 Coronary CTA 10/18/2018 Coronary calcium score 0 No evidence of CAD Normal coronary origin with right dominance Hypothyroidism TIA Hyperlipidemia  Established with cardiology, initially with Dr. Purvis Sheffield, in 11/2017 for bradycardia and abnormal ECG.  ECG revealed sinus bradycardia at 45 bpm with Q waves inferiorly.  Her heart rate during office visit was 49 bpm.  Previous hospitalization August 2019 [facility] for TIA treated with aspirin and pravastatin.  She reported fatigue and occasional palpitations.  Last cardiology clinic visit was 10/27/2022 with Dr. Nelly Laurence. Loop recorder revealed episodes of nonsustained atrial tachycardia and premature atrial contractions but otherwise no clinically significant arrhythmia.  She is very symptomatic with these episodes.  1 episode occurred 10/13/2022 captured on loop recorder that correlated with atrial tachycardia with rate over 200 bpm but lasted no more than 5 seconds.  Unable to tolerate rate slowing medications due to history of baseline bradycardia.         History of Present Illness: .   [name] is a *** 57 y.o. female who is here today for preoperative cardiac evaluation for right total knee arthroplasty  ROS: ***       Studies Reviewed: .        *** Risk Assessment/Calculations:   {Does this patient have ATRIAL  FIBRILLATION?:[phone]} No BP recorded.  {Refresh Note OR Click here to enter BP  :1}***       Physical Exam:   VS:  LMP 01/09/2011    Wt Readings from Last 3 Encounters:  10/27/22 174 lb 9.6 oz (79.2 kg)  07/12/22 169 lb 6.4 oz (76.8 kg)  05/25/22 169 lb 3.2 oz (76.7 kg)    GEN: Well nourished, well developed in no acute distress NECK: No JVD; No carotid bruits CARDIAC: ***RRR, no murmurs, rubs, gallops RESPIRATORY:  Clear to auscultation without rales, wheezing or rhonchi  ABDOMEN: Soft, non-tender, non-distended EXTREMITIES:  No edema; No deformity     ASSESSMENT AND PLAN: .   ***    {Are you ordering a CV Procedure (e.g. stress test, cath, DCCV, TEE, etc)?   Press F2        :[id number]}  Dispo: ***  Signed, Eligha Bridegroom, NP-C

## 2022-11-03 ENCOUNTER — Ambulatory Visit: Payer: 59 | Admitting: Nurse Practitioner

## 2022-11-06 ENCOUNTER — Ambulatory Visit: Payer: 59

## 2022-11-06 NOTE — Progress Notes (Signed)
Carelink Summary Report / Loop Recorder 

## 2022-11-21 LAB — CUP PACEART REMOTE DEVICE CHECK
Date Time Interrogation Session: 20240902230118
Implantable Pulse Generator Implant Date: 20210827

## 2022-11-23 ENCOUNTER — Ambulatory Visit: Payer: 59 | Admitting: Physician Assistant

## 2022-11-27 ENCOUNTER — Ambulatory Visit (INDEPENDENT_AMBULATORY_CARE_PROVIDER_SITE_OTHER): Payer: 59

## 2022-11-27 DIAGNOSIS — R002 Palpitations: Secondary | ICD-10-CM

## 2022-11-28 NOTE — Patient Instructions (Signed)
SURGICAL WAITING ROOM VISITATION  Patients having surgery or a procedure may have no more than 2 support people in the waiting area - these visitors may rotate.    Children under the age of 8 must have an adult with them who is not the patient.  Due to an increase in RSV and influenza rates and associated hospitalizations, children ages 74 and under may not visit patients in [facility] hospitals.  If the patient needs to stay at the hospital during part of their recovery, the visitor guidelines for inpatient rooms apply. Pre-op nurse will coordinate an appropriate time for 1 support person to accompany patient in pre-op.  This support person may not rotate.    Please refer to the [facility] website for the visitor guidelines for Inpatients (after your surgery is over and you are in a regular room).    Your procedure is scheduled on: 12/12/22   Report to [facility] Main Entrance    Report to admitting at 5:15 AM   Call this number if you have problems the morning of surgery [phone]   Do not eat food :After Midnight.   After Midnight you may have the following liquids until 4:15 AM DAY OF SURGERY  Water Non-Citrus Juices (without pulp, NO RED-Apple, White grape, White cranberry) Black Coffee (NO MILK/CREAM OR CREAMERS, sugar ok)  Clear Tea (NO MILK/CREAM OR CREAMERS, sugar ok) regular and decaf                             Plain Jell-O (NO RED)                                           Fruit ices (not with fruit pulp, NO RED)                                     Popsicles (NO RED)                                                               Sports drinks like Gatorade (NO RED)               The day of surgery:  Drink ONE (1) Pre-Surgery Clear Ensure at 4:15 AM the morning of surgery. Drink in one sitting. Do not sip.  This drink was given to you during your hospital  pre-op appointment visit. Nothing else to drink after completing the  Pre-Surgery Clear Ensure.           If you have questions, please contact your surgeon's office.   FOLLOW BOWEL PREP AND ANY ADDITIONAL PRE OP INSTRUCTIONS YOU RECEIVED FROM YOUR SURGEON'S OFFICE!!!     Oral Hygiene is also important to reduce your risk of infection.                                    Remember - BRUSH YOUR TEETH THE MORNING OF SURGERY WITH YOUR REGULAR TOOTHPASTE  DENTURES WILL BE REMOVED  PRIOR TO SURGERY PLEASE DO NOT APPLY "Poly grip" OR ADHESIVES!!!   Do NOT smoke after Midnight   Stop all vitamins and herbal supplements 7 days before surgery.   Take these medicines the morning of surgery with A SIP OF WATER: Lexapro, Armour  DO NOT TAKE ANY ORAL DIABETIC MEDICATIONS DAY OF YOUR SURGERY  Bring CPAP mask and tubing day of surgery.                              You may not have any metal on your body including hair pins, jewelry, and body piercing             Do not wear make-up, lotions, powders, perfumes, or deodorant  Do not wear nail polish including gel and S&S, artificial/acrylic nails, or any other type of covering on natural nails including finger and toenails. If you have artificial nails, gel coating, etc. that needs to be removed by a nail salon please have this removed prior to surgery or surgery may need to be canceled/ delayed if the surgeon/ anesthesia feels like they are unable to be safely monitored.   Do not shave  48 hours prior to surgery.    Do not bring valuables to the hospital. [facility] IS NOT             RESPONSIBLE   FOR VALUABLES.   Contacts, glasses, dentures or bridgework may not be worn into surgery.   Bring small overnight bag day of surgery.   DO NOT BRING YOUR HOME MEDICATIONS TO THE HOSPITAL. PHARMACY WILL DISPENSE MEDICATIONS LISTED ON YOUR MEDICATION LIST TO YOU DURING YOUR ADMISSION IN THE HOSPITAL!   Special Instructions: Bring a copy of your healthcare power of attorney and living will documents the day of surgery if you haven't scanned them  before.              Please read over the following fact sheets you were given: IF YOU HAVE QUESTIONS ABOUT YOUR PRE-OP INSTRUCTIONS PLEASE CALL [phone] [facility]    If you received a COVID test during your pre-op visit  it is requested that you wear a mask when out in public, stay away from anyone that may not be feeling well and notify your surgeon if you develop symptoms. If you test positive for Covid or have been in contact with anyone that has tested positive in the last 10 days please notify you surgeon.      Pre-operative 5 CHG Bath Instructions   You can play a key role in reducing the risk of infection after surgery. Your skin needs to be as free of germs as possible. You can reduce the number of germs on your skin by washing with CHG (chlorhexidine gluconate) soap before surgery. CHG is an antiseptic soap that kills germs and continues to kill germs even after washing.   DO NOT use if you have an allergy to chlorhexidine/CHG or antibacterial soaps. If your skin becomes reddened or irritated, stop using the CHG and notify one of our RNs at [phone].   Please shower with the CHG soap starting 4 days before surgery using the following schedule:     Please keep in mind the following:  DO NOT shave, including legs and underarms, starting the day of your first shower.   You may shave your face at any point before/day of surgery.  Place clean sheets on your bed the day you start  using CHG soap. Use a clean washcloth (not used since being washed) for each shower. DO NOT sleep with pets once you start using the CHG.   CHG Shower Instructions:  If you choose to wash your hair and private area, wash first with your normal shampoo/soap.  After you use shampoo/soap, rinse your hair and body thoroughly to remove shampoo/soap residue.  Turn the water OFF and apply about 3 tablespoons (45 ml) of CHG soap to a CLEAN washcloth.  Apply CHG soap ONLY FROM YOUR NECK DOWN TO YOUR TOES  (washing for 3-5 minutes)  DO NOT use CHG soap on face, private areas, open wounds, or sores.  Pay special attention to the area where your surgery is being performed.  If you are having back surgery, having someone wash your back for you may be helpful. Wait 2 minutes after CHG soap is applied, then you may rinse off the CHG soap.  Pat dry with a clean towel  Put on clean clothes/pajamas   If you choose to wear lotion, please use ONLY the CHG-compatible lotions on the back of this paper.     Additional instructions for the day of surgery: DO NOT APPLY any lotions, deodorants, cologne, or perfumes.   Put on clean/comfortable clothes.  Brush your teeth.  Ask your nurse before applying any prescription medications to the skin.      CHG Compatible Lotions   Aveeno Moisturizing lotion  Cetaphil Moisturizing Cream  Cetaphil Moisturizing Lotion  Clairol Herbal Essence Moisturizing Lotion, Dry Skin  Clairol Herbal Essence Moisturizing Lotion, Extra Dry Skin  Clairol Herbal Essence Moisturizing Lotion, Normal Skin  Curel Age Defying Therapeutic Moisturizing Lotion with Alpha Hydroxy  Curel Extreme Care Body Lotion  Curel Soothing Hands Moisturizing Hand Lotion  Curel Therapeutic Moisturizing Cream, Fragrance-Free  Curel Therapeutic Moisturizing Lotion, Fragrance-Free  Curel Therapeutic Moisturizing Lotion, Original Formula  Eucerin Daily Replenishing Lotion  Eucerin Dry Skin Therapy Plus Alpha Hydroxy Crme  Eucerin Dry Skin Therapy Plus Alpha Hydroxy Lotion  Eucerin Original Crme  Eucerin Original Lotion  Eucerin Plus Crme Eucerin Plus Lotion  Eucerin TriLipid Replenishing Lotion  Keri Anti-Bacterial Hand Lotion  Keri Deep Conditioning Original Lotion Dry Skin Formula Softly Scented  Keri Deep Conditioning Original Lotion, Fragrance Free Sensitive Skin Formula  Keri Lotion Fast Absorbing Fragrance Free Sensitive Skin Formula  Keri Lotion Fast Absorbing Softly Scented Dry Skin  Formula  Keri Original Lotion  Keri Skin Renewal Lotion Keri Silky Smooth Lotion  Keri Silky Smooth Sensitive Skin Lotion  Nivea Body Creamy Conditioning Oil  Nivea Body Extra Enriched Lotion  Nivea Body Original Lotion  Nivea Body Sheer Moisturizing Lotion Nivea Crme  Nivea Skin Firming Lotion  NutraDerm 30 Skin Lotion  NutraDerm Skin Lotion  NutraDerm Therapeutic Skin Cream  NutraDerm Therapeutic Skin Lotion  ProShield Protective Hand Cream  Provon moisturizing lotion   Incentive Spirometer  An incentive spirometer is a tool that can help keep your lungs clear and active. This tool measures how well you are filling your lungs with each breath. Taking long deep breaths may help reverse or decrease the chance of developing breathing (pulmonary) problems (especially infection) following: A long period of time when you are unable to move or be active. BEFORE THE PROCEDURE  If the spirometer includes an indicator to show your best effort, your nurse or respiratory therapist will set it to a desired goal. If possible, sit up straight or lean slightly forward. Try not to slouch. Hold the incentive spirometer  in an upright position. INSTRUCTIONS FOR USE  Sit on the edge of your bed if possible, or sit up as far as you can in bed or on a chair. Hold the incentive spirometer in an upright position. Breathe out normally. Place the mouthpiece in your mouth and seal your lips tightly around it. Breathe in slowly and as deeply as possible, raising the piston or the ball toward the top of the column. Hold your breath for 3-5 seconds or for as long as possible. Allow the piston or ball to fall to the bottom of the column. Remove the mouthpiece from your mouth and breathe out normally. Rest for a few seconds and repeat Steps 1 through 7 at least 10 times every 1-2 hours when you are awake. Take your time and take a few normal breaths between deep breaths. The spirometer may include an indicator to  show your best effort. Use the indicator as a goal to work toward during each repetition. After each set of 10 deep breaths, practice coughing to be sure your lungs are clear. If you have an incision (the cut made at the time of surgery), support your incision when coughing by placing a pillow or rolled up towels firmly against it. Once you are able to get out of bed, walk around indoors and cough well. You may stop using the incentive spirometer when instructed by your caregiver.  RISKS AND COMPLICATIONS Take your time so you do not get dizzy or light-headed. If you are in pain, you may need to take or ask for pain medication before doing incentive spirometry. It is harder to take a deep breath if you are having pain. AFTER USE Rest and breathe slowly and easily. It can be helpful to keep track of a log of your progress. Your caregiver can provide you with a simple table to help with this. If you are using the spirometer at home, follow these instructions: SEEK MEDICAL CARE IF:  You are having difficultly using the spirometer. You have trouble using the spirometer as often as instructed. Your pain medication is not giving enough relief while using the spirometer. You develop fever of 100.5 F (38.1 C) or higher. SEEK IMMEDIATE MEDICAL CARE IF:  You cough up bloody sputum that had not been present before. You develop fever of 102 F (38.9 C) or greater. You develop worsening pain at or near the incision site. MAKE SURE YOU:  Understand these instructions. Will watch your condition. Will get help right away if you are not doing well or get worse. Document Released: 07/17/2006 Document Revised: 05/29/2011 Document Reviewed: 09/17/2006 [facility] Patient Information 2014 [place], [place].   ________________________________________________________________________

## 2022-11-28 NOTE — Progress Notes (Signed)
COVID Vaccine Completed:  Date of COVID positive in last 90 days:  PCP - Roe Rutherford, NP Cardiologist - Dina Rich, MD Electrophysiologist- York Pellant, MD  Cardiac clearance by York Pellant, MD 10/27/22 in Epic   Chest x-ray -  EKG - 10/27/22 Epic Stress Test - 05/17/21 Epic ECHO - 11/14/17 Epic Cardiac Cath -  Pacemaker/ICD device last checked: loop recorder 11/20/22 in Epic Spinal Cord Stimulator:  Bowel Prep -   Sleep Study -  CPAP -   Fasting Blood Sugar -  Checks Blood Sugar _____ times a day  Last dose of GLP1 agonist-  N/A GLP1 instructions:  N/A   Last dose of SGLT-2 inhibitors-  N/A SGLT-2 instructions: N/A   Blood Thinner Instructions:  Time Aspirin Instructions: Last Dose:  Activity level:  Can go up a flight of stairs and perform activities of daily living without stopping and without symptoms of chest pain or shortness of breath.  Able to exercise without symptoms  Unable to go up a flight of stairs without symptoms of     Anesthesia review: TIA, loop recorder, PACs  Patient denies shortness of breath, fever, cough and chest pain at PAT appointment  Patient verbalized understanding of instructions that were given to them at the PAT appointment. Patient was also instructed that they will need to review over the PAT instructions again at home before surgery.

## 2022-11-29 ENCOUNTER — Encounter (HOSPITAL_COMMUNITY)
Admission: RE | Admit: 2022-11-29 | Discharge: 2022-11-29 | Disposition: A | Discharge status: Home / Self Care | Payer: 59 | Source: Ambulatory Visit | Attending: Anesthesiology | Admitting: Anesthesiology

## 2022-11-29 DIAGNOSIS — Z01818 Encounter for other preprocedural examination: Secondary | ICD-10-CM

## 2022-11-29 DIAGNOSIS — R002 Palpitations: Secondary | ICD-10-CM

## 2022-11-29 NOTE — Progress Notes (Signed)
Called patient about PAT appointment scheduled at 0800. She stated she had called yesterday saying she needs to reschedule and never heard back. Asked patient if we could do phone interview part today and she can come in for blood at a later time. Patient stated she does not have time today as she is working from home and has meetings. Let scheduler know.

## 2022-12-03 NOTE — Progress Notes (Signed)
COVID Vaccine received:  [x]  No []  Yes Date of any COVID positive Test in last 90 days:  none  PCP - Roe Rutherford, NP [phone] (Work) [phone] (Fax)  Cardiologist - Dina Rich, MD EP- York Pellant, MD  cardiac Clearance 10-27-22 note in Epic Pain Mgmt-  Wynonia Lawman, MD   LOV- 08-22-2022   gets SI joint injections  Chest x-ray -  EKG -  10-27-22  Epic Stress Test - 05-17-2021  Epic ECHO - 11-14-2017   Epic Cardiac Cath -  CTA Cardiac Calcium score: 0  on 10-16-2018  PCR screen: [x]  Ordered & Completed           []   No Order but Needs PROFEND           []   N/A for this surgery  Surgery Plan:  []  Ambulatory   [x]  Outpatient in bed  []  Admit  Anesthesia:    []  General  [x]  Spinal  []   Choice []   MAC  Pacemaker / ICD device [x]  No []  Yes   Spinal Cord Stimulator:[x]  No []  Yes   LOOP RECORDER:  Last checked 11-20-22 note in Epic     History of Sleep Apnea? [x]  No []  Yes   CPAP used?- []  No [x]  Yes    Does the patient monitor blood sugar?  []  No []  Yes  [x]  N/A  Patient has: [x]  NO Hx DM   []  Pre-DM   []  DM1  []   DM2 Last A1c was: normal 5.1 on 01-26-2020      Blood Thinner / Instructions:  None Aspirin Instructions:  none  ERAS Protocol Ordered: []  No  [x]  Yes PRE-SURGERY [x]  ENSURE  []  G2   []  No Drink Ordered Patient is to be NPO after: 04:15 am  Comments: Patient was given the 5 CHG shower / bath instructions for TKA surgery along with 2 bottles of the CHG soap. Patient will start this on: Friday  12-08-2022 All questions were asked and answered, Patient voiced understanding of this process.   Activity level: Patient is able to climb a flight of stairs without difficulty; [x]  No CP  [x]  No SOB, but would have leg pain.  Patient can perform ADLs without assistance.   Anesthesia review: Current Phentermine use- told to stop today 12-04-22, hx TIA, PACs (Has Loop recorder- last checked 11-20-2022), PONV  Patient denies shortness of breath, fever, cough and chest pain  at PAT appointment.  Patient verbalized understanding and agreement to the Pre-Surgical Instructions that were given to them at this PAT appointment. Patient was also educated of the need to review these PAT instructions again prior to her surgery.I reviewed the appropriate phone numbers to call if they have any and questions or concerns.

## 2022-12-03 NOTE — Patient Instructions (Signed)
SURGICAL WAITING ROOM VISITATION Patients having surgery or a procedure may have no more than 2 support people in the waiting area - these visitors may rotate in the visitor waiting room.   Due to an increase in RSV and influenza rates and associated hospitalizations, children ages 80 and under may not visit patients in [facility] hospitals. If the patient needs to stay at the hospital during part of their recovery, the visitor guidelines for inpatient rooms apply.  PRE-OP VISITATION  Pre-op nurse will coordinate an appropriate time for 1 support person to accompany the patient in pre-op.  This support person may not rotate.  This visitor will be contacted when the time is appropriate for the visitor to come back in the pre-op area.  Please refer to the [facility] website for the visitor guidelines for Inpatients (after your surgery is over and you are in a regular room).  You are not required to quarantine at this time prior to your surgery. However, you must do this: Hand Hygiene often Do NOT share personal items Notify your provider if you are in close contact with someone who has COVID or you develop fever 100.4 or greater, new onset of sneezing, cough, sore throat, shortness of breath or body aches.  If you test positive for Covid or have been in contact with anyone that has tested positive in the last 10 days please notify you surgeon.    Your procedure is scheduled on:  TUESDAY  December 12, 2022  Report to [facility] Main Entrance: [address]sen entrance where the [employer] is available.   Report to admitting at:   05:15  AM  Call this number if you have any questions or problems the morning of surgery [phone]  Do not eat food after Midnight the night prior to your surgery/procedure.  After Midnight you may have the following liquids until    04:15 AM  DAY OF SURGERY  Clear Liquid Diet Water Black Coffee (sugar ok, NO MILK/CREAM OR CREAMERS)  Tea (sugar ok,  NO MILK/CREAM OR CREAMERS) regular and decaf                             Plain Jell-O  with no fruit (NO RED)                                           Fruit ices (not with fruit pulp, NO RED)                                     Popsicles (NO RED)                                                                  Juice: NO CITRUS JUICES: only apple, WHITE grape, WHITE cranberry Sports drinks like Gatorade or Powerade (NO RED)                   The day of surgery:  Drink ONE (1) Pre-Surgery Clear Ensure at 04:15  AM the  morning of surgery. Drink in one sitting. Do not sip.  This drink was given to you during your hospital pre-op appointment visit. Nothing else to drink after completing the Pre-Surgery Clear Ensure  : No candy, chewing gum or throat lozenges.    FOLLOW  ANY ADDITIONAL PRE OP INSTRUCTIONS YOU RECEIVED FROM YOUR SURGEON'S OFFICE!!!   Oral Hygiene is also important to reduce your risk of infection.        Remember - BRUSH YOUR TEETH THE MORNING OF SURGERY WITH YOUR REGULAR TOOTHPASTE  Do NOT smoke after Midnight the night before surgery.  STOP TAKING all Vitamins, Herbs and supplements 1 week before your surgery.   Take ONLY these medicines the morning of surgery with A SIP OF WATER: escitalopram (Lexapro),  Thyroid (Armour).    You may not have any metal on your body including hair pins, jewelry, and body piercing  Do not wear make-up, lotions, powders, perfumes or deodorant  Do not wear nail polish including gel and S&S, artificial / acrylic nails, or any other type of covering on natural nails including finger and toenails. If you have artificial nails, gel coating, etc., that needs to be removed by a nail salon, Please have this removed prior to surgery. Not doing so may mean that your surgery could be cancelled or delayed if the Surgeon or anesthesia staff feels like they are unable to monitor you safely.   Do not shave 48 hours prior to surgery to avoid nicks in your  skin which may contribute to postoperative infections.   Contacts, Hearing Aids, dentures or bridgework may not be worn into surgery. DENTURES WILL BE REMOVED PRIOR TO SURGERY PLEASE DO NOT APPLY "Poly grip" OR ADHESIVES!!!  You may bring a small overnight bag with you on the day of surgery, only pack items that are not valuable. [facility] IS NOT RESPONSIBLE   FOR VALUABLES THAT ARE LOST OR STOLEN.    Do not bring your home medications to the hospital. The Pharmacy will dispense medications listed on your medication list to you during your admission in the Hospital.  Special Instructions: Bring a copy of your healthcare power of attorney and living will documents the day of surgery, if you wish to have them scanned into your [facility] Medical Records- EPIC  Please read over the following fact sheets you were given: IF YOU HAVE QUESTIONS ABOUT YOUR PRE-OP INSTRUCTIONS, PLEASE CALL [phone].     Pre-operative 5 CHG Bath Instructions   You can play a key role in reducing the risk of infection after surgery. Your skin needs to be as free of germs as possible. You can reduce the number of germs on your skin by washing with CHG (chlorhexidine gluconate) soap before surgery. CHG is an antiseptic soap that kills germs and continues to kill germs even after washing.   DO NOT use if you have an allergy to chlorhexidine/CHG or antibacterial soaps. If your skin becomes reddened or irritated, stop using the CHG and notify one of our RNs at [phone]  Please shower with the CHG soap starting 4 days before surgery using the following schedule: START SHOWERS ON   FRIDAY  December 08, 2022  Please keep in mind the following:  DO NOT shave, including legs and underarms, starting the day of your first shower.   You  may shave your face at any point before/day of surgery.   Place clean sheets on your bed the day you start using CHG soap. Use a clean washcloth (not used since being washed) for each shower. DO NOT sleep with pets once you start using the CHG.   CHG Shower Instructions:  If you choose to wash your hair and private area, wash first with your normal shampoo/soap.  After you use shampoo/soap, rinse your hair and body thoroughly to remove shampoo/soap residue.  Turn the water OFF and apply about 3 tablespoons (45 ml) of CHG soap to a CLEAN washcloth.  Apply CHG soap ONLY FROM YOUR NECK DOWN TO YOUR TOES (washing for 3-5 minutes)  DO NOT use CHG soap on face, private areas, open wounds, or sores.  Pay special attention to the area where your surgery is being performed.  If you are having back surgery, having someone wash your back for you may be helpful.  Wait 2 minutes after CHG soap is applied, then you may rinse off the CHG soap.  Pat dry with a clean towel  Put on clean clothes/pajamas   If you choose to wear lotion, please use ONLY the CHG-compatible lotions on the back of this paper.     Additional instructions for the day of surgery: DO NOT APPLY any lotions, deodorants, cologne, or perfumes.   Put on clean/comfortable clothes.  Brush your teeth.  Ask your nurse before applying any prescription medications to the skin.      CHG Compatible Lotions   Aveeno Moisturizing lotion  Cetaphil Moisturizing Cream  Cetaphil Moisturizing Lotion  Clairol Herbal Essence Moisturizing Lotion, Dry Skin  Clairol Herbal Essence Moisturizing Lotion, Extra Dry Skin  Clairol Herbal Essence Moisturizing Lotion, Normal Skin  Curel Age Defying Therapeutic Moisturizing Lotion with Alpha Hydroxy  Curel Extreme Care Body Lotion  Curel Soothing Hands Moisturizing Hand Lotion  Curel Therapeutic Moisturizing Cream, Fragrance-Free  Curel Therapeutic Moisturizing Lotion, Fragrance-Free  Curel  Therapeutic Moisturizing Lotion, Original Formula  Eucerin Daily Replenishing Lotion  Eucerin Dry Skin Therapy Plus Alpha Hydroxy Crme  Eucerin Dry Skin Therapy Plus Alpha Hydroxy Lotion  Eucerin Original Crme  Eucerin Original Lotion  Eucerin Plus Crme Eucerin Plus Lotion  Eucerin TriLipid Replenishing Lotion  Keri Anti-Bacterial Hand Lotion  Keri Deep Conditioning Original Lotion Dry Skin Formula Softly Scented  Keri Deep Conditioning Original Lotion, Fragrance Free Sensitive Skin Formula  Keri Lotion Fast Absorbing Fragrance Free Sensitive Skin Formula  Keri Lotion Fast Absorbing Softly Scented Dry Skin Formula  Keri Original Lotion  Keri Skin Renewal Lotion Keri Silky Smooth Lotion  Keri Silky Smooth Sensitive Skin Lotion  Nivea Body Creamy Conditioning Oil  Nivea Body Extra Enriched Lotion  Nivea Body Original Lotion  Nivea Body Sheer Moisturizing Lotion Nivea Crme  Nivea Skin Firming Lotion  NutraDerm 30 Skin Lotion  NutraDerm Skin Lotion  NutraDerm Therapeutic Skin Cream  NutraDerm Therapeutic Skin Lotion  ProShield Protective Hand Cream  Provon moisturizing lotion   FAILURE TO FOLLOW THESE INSTRUCTIONS MAY RESULT IN THE CANCELLATION OF YOUR SURGERY  PATIENT SIGNATURE_________________________________  NURSE SIGNATURE__________________________________  ________________________________________________________________________      [name]    An incentive spirometer is a tool that can help keep your lungs clear and active. This tool measures how well you are filling your lungs with each breath. Taking long  deep breaths may help reverse or decrease the chance of developing breathing (pulmonary) problems (especially infection) following: A long period of time when you are unable to move or be active. BEFORE THE PROCEDURE  If the spirometer includes an indicator to show your best effort, your nurse or respiratory therapist will set it to a desired  goal. If possible, sit up straight or lean slightly forward. Try not to slouch. Hold the incentive spirometer in an upright position. INSTRUCTIONS FOR USE  Sit on the edge of your bed if possible, or sit up as far as you can in bed or on a chair. Hold the incentive spirometer in an upright position. Breathe out normally. Place the mouthpiece in your mouth and seal your lips tightly around it. Breathe in slowly and as deeply as possible, raising the piston or the ball toward the top of the column. Hold your breath for 3-5 seconds or for as long as possible. Allow the piston or ball to fall to the bottom of the column. Remove the mouthpiece from your mouth and breathe out normally. Rest for a few seconds and repeat Steps 1 through 7 at least 10 times every 1-2 hours when you are awake. Take your time and take a few normal breaths between deep breaths. The spirometer may include an indicator to show your best effort. Use the indicator as a goal to work toward during each repetition. After each set of 10 deep breaths, practice coughing to be sure your lungs are clear. If you have an incision (the cut made at the time of surgery), support your incision when coughing by placing a pillow or rolled up towels firmly against it. Once you are able to get out of bed, walk around indoors and cough well. You may stop using the incentive spirometer when instructed by your caregiver.  RISKS AND COMPLICATIONS Take your time so you do not get dizzy or light-headed. If you are in pain, you may need to take or ask for pain medication before doing incentive spirometry. It is harder to take a deep breath if you are having pain. AFTER USE Rest and breathe slowly and easily. It can be helpful to keep track of a log of your progress. Your caregiver can provide you with a simple table to help with this. If you are using the spirometer at home, follow these instructions: SEEK MEDICAL CARE IF:  You are having difficultly  using the spirometer. You have trouble using the spirometer as often as instructed. Your pain medication is not giving enough relief while using the spirometer. You develop fever of 100.5 F (38.1 C) or higher.                                                                                                    SEEK IMMEDIATE MEDICAL CARE IF:  You cough up bloody sputum that had not been present before. You develop fever of 102 F (38.9 C) or greater. You develop worsening pain at or near the incision site. MAKE SURE YOU:  Understand these instructions. Will watch your condition. Will  get help right away if you are not doing well or get worse. Document Released: 07/17/2006 Document Revised: 05/29/2011 Document Reviewed: 09/17/2006 [facility] Patient Information 2014 [place], [place].

## 2022-12-04 ENCOUNTER — Other Ambulatory Visit: Payer: Self-pay

## 2022-12-04 ENCOUNTER — Encounter (HOSPITAL_COMMUNITY)
Admission: RE | Admit: 2022-12-04 | Discharge: 2022-12-04 | Disposition: A | Discharge status: Home / Self Care | Payer: 59 | Source: Ambulatory Visit | Attending: Orthopedic Surgery | Admitting: Orthopedic Surgery

## 2022-12-04 ENCOUNTER — Encounter (HOSPITAL_COMMUNITY): Payer: Self-pay

## 2022-12-04 DIAGNOSIS — Z79899 Other long term (current) drug therapy: Secondary | ICD-10-CM | POA: Diagnosis not present

## 2022-12-04 DIAGNOSIS — Z9889 Other specified postprocedural states: Secondary | ICD-10-CM | POA: Diagnosis not present

## 2022-12-04 DIAGNOSIS — Z8673 Personal history of transient ischemic attack (TIA), and cerebral infarction without residual deficits: Secondary | ICD-10-CM | POA: Insufficient documentation

## 2022-12-04 DIAGNOSIS — Z01812 Encounter for preprocedural laboratory examination: Secondary | ICD-10-CM | POA: Insufficient documentation

## 2022-12-04 DIAGNOSIS — Z01818 Encounter for other preprocedural examination: Secondary | ICD-10-CM | POA: Diagnosis present

## 2022-12-04 DIAGNOSIS — R002 Palpitations: Secondary | ICD-10-CM | POA: Insufficient documentation

## 2022-12-04 DIAGNOSIS — M1711 Unilateral primary osteoarthritis, right knee: Secondary | ICD-10-CM | POA: Insufficient documentation

## 2022-12-04 DIAGNOSIS — I491 Atrial premature depolarization: Secondary | ICD-10-CM | POA: Insufficient documentation

## 2022-12-04 HISTORY — DX: Adverse effect of appetite depressants, initial encounter: T50.5X5A

## 2022-12-04 HISTORY — DX: Family history of other specified conditions: Z84.89

## 2022-12-04 HISTORY — DX: Cardiac arrhythmia, unspecified: I49.9

## 2022-12-04 HISTORY — DX: Anxiety disorder, unspecified: F41.9

## 2022-12-04 LAB — BASIC METABOLIC PANEL
Anion gap: 10 (ref 5–15)
BUN: 10 mg/dL (ref 6–20)
CO2: 23 mmol/L (ref 22–32)
Calcium: 8.9 mg/dL (ref 8.9–10.3)
Chloride: 102 mmol/L (ref 98–111)
Creatinine, Ser: 0.55 mg/dL (ref 0.44–1.00)
GFR, Estimated: >60 mL/min (ref >60)
Glucose, Bld: 96 mg/dL (ref 70–99)
Potassium: 4.1 mmol/L (ref 3.5–5.1)
Sodium: 135 mmol/L (ref 135–145)

## 2022-12-04 LAB — CBC
HCT: 42 % (ref 36.0–46.0)
Hemoglobin: 13.7 g/dL (ref 12.0–15.0)
MCH: 29.8 pg (ref 26.0–34.0)
MCHC: 32.6 g/dL (ref 30.0–36.0)
MCV: 91.5 fL (ref 80.0–100.0)
Platelets: 321 10*3/uL (ref 150–400)
RBC: 4.59 MIL/uL (ref 3.87–5.11)
RDW: 12.4 % (ref 11.5–15.5)
WBC: 7.2 10*3/uL (ref 4.0–10.5)
nRBC: 0 % (ref 0.0–0.2)

## 2022-12-04 LAB — SURGICAL PCR SCREEN
MRSA, PCR: NEGATIVE
Staphylococcus aureus: NEGATIVE

## 2022-12-05 NOTE — Anesthesia Preprocedure Evaluation (Addendum)
Anesthesia Evaluation  Patient identified by MRN, date of birth, ID band Patient awake    Reviewed: Allergy & Precautions, NPO status , Patient's Chart, lab work & pertinent test results  History of Anesthesia Complications (+) PONV, Family history of anesthesia reaction and history of anesthetic complications  Airway Mallampati: II  TM Distance: >3 FB Neck ROM: Full    Dental no notable dental hx. (+) Dental Advisory Given, Teeth Intact   Pulmonary neg pulmonary ROS   Pulmonary exam normal breath sounds clear to auscultation       Cardiovascular Normal cardiovascular exam+ dysrhythmias  Rhythm:Regular Rate:Normal  Stress 04/2021 Resting ECG is normal. ECG rhythm shows normal sinus rhythm. ECG demonstrates incomplete right bundle branch block.  Stress Findings: Bruce protocol stress test was performed. Exercise capacity was mildly impaired. Patient exercised for 6 min and 0 sec. Maximum HR of 171 bpm. MPHR 102.0 %. Peak METS 7.0 .  The patient experienced no angina during the test. The patient achieved the target heart rate. The patient requested the test to be stopped. The patient reported no symptoms during the stress test. Normal blood pressure and normal heart rate response noted during stress. Heart rate recovery was normal.  Stress ECG:No ST deviation was noted. There were no arrhythmias during stress. ECG was interpretable and conclusive. The ECG was negative for ischemia.     Neuro/Psych  PSYCHIATRIC DISORDERS Anxiety     CVA    GI/Hepatic negative GI ROS, Neg liver ROS,,,  Endo/Other  Hypothyroidism    Renal/GU negative Renal ROS     Musculoskeletal  (+) Arthritis ,    Abdominal   Peds  Hematology negative hematology ROS (+)   Anesthesia Other Findings   Reproductive/Obstetrics negative OB ROS                              Anesthesia Physical Anesthesia Plan  ASA: 3  Anesthesia  Plan: Spinal   Post-op Pain Management: Tylenol PO (pre-op)*, Gabapentin PO (pre-op)* and Regional block*   Induction: Intravenous  PONV Risk Score and Plan: 4 or greater and Ondansetron, Dexamethasone, Propofol infusion, Midazolam and Treatment may vary due to age or medical condition  Airway Management Planned: Natural Airway  Additional Equipment:   Intra-op Plan:   Post-operative Plan:   Informed Consent: I have reviewed the patients History and Physical, chart, labs and discussed the procedure including the risks, benefits and alternatives for the proposed anesthesia with the patient or authorized representative who has indicated his/her understanding and acceptance.     Dental advisory given  Plan Discussed with: CRNA  Anesthesia Plan Comments: (See PAT note 12/04/2022)        Anesthesia Quick Evaluation

## 2022-12-05 NOTE — Progress Notes (Signed)
Anesthesia Chart Review   Case: [id number] Date/Time: 12/12/22 0700   Procedure: TOTAL KNEE ARTHROPLASTY (Right: Knee)   Anesthesia type: Spinal   Pre-op diagnosis: Right knee osteoarthritis   Location: [facility] ROOM 09 / [facility]   Surgeons: Durene Romans, MD       DISCUSSION:57 y.o. never smoker with h/o PONV, TIA, PACs, right knee OA scheduled for above procedure 12/12/2022 with Dr. Durene Romans.   Pt with loop recorder in place.   Stress Test 05/17/2021 negative for ischemia.   Pt seen by cardiology 10/27/2022 for preoperative evaluation. Per OV note, "Preoperative evaluation - TKA She does not have any relevant cardiac history -- no CAD, no CHF  She is able to climb 2 flights of stairs She is low risk for TKA"  VS: BP 129/67 Comment: right arm sitting  Pulse 66   Temp 36.6 C (Oral)   Resp 18   Ht 5\' 2"  (1.575 m)   Wt 75.3 kg   LMP 01/09/2011   SpO2 100%   BMI 30.36 kg/m   PROVIDERS: Roe Rutherford, NP is PCP   Cardiologist - Dina Rich, MD  Electrophysiologist- York Pellant, MD LABS: Labs reviewed: Acceptable for surgery. (all labs ordered are listed, but only abnormal results are displayed)  Labs Reviewed  SURGICAL PCR SCREEN  BASIC METABOLIC PANEL  CBC     IMAGES:   EKG:   CV:  CT Coronary 10/16/2018 IMPRESSION: 1. No evidence of CAD, CADRADS = 0.   2. Coronary calcium score of 0. This was 0 percentile for age and sex matched control.   3. Normal coronary origin with right dominance. Past Medical History:  Diagnosis Date   Anxiety    DDD (degenerative disc disease), lumbar 01/19/2016   Dysrhythmia    PACs, palpitations (Has Loop recorder)   Exposure to phentermine    uses currently   Family history of adverse reaction to anesthesia    son has N&V   History of TIA (transient ischemic attack) 01/19/2016   Hypothyroidism    Osteoarthritis of both feet 01/19/2016   Osteoarthritis of both knees 01/19/2016   PONV (postoperative nausea and  vomiting)    Stroke (HCC) 07/2010   "TIA" per patient   Stroke (HCC) 2018   TIA   Vitamin D deficiency disease 11/28/2018    Past Surgical History:  Procedure Laterality Date   BIOPSY  05/15/2018   Procedure: BIOPSY;  Surgeon: Corbin Ade, MD;  Location[facility];  Service: Endoscopy;;  polyp cb   CHOLECYSTECTOMY     COLONOSCOPY N/A 05/15/2018   Procedure: COLONOSCOPY;  Surgeon: Corbin Ade, MD;  Location[facility];  Service: Endoscopy;  Laterality: N/A;  12:00pm   EYE SURGERY Bilateral    Lasik   implantable loop recorder placement  11/15/2019   Medtronic Reveal Linq model LNQ 22 ([id number] [id number])  implantable loop recorder by Dr Johney Frame   KNEE ARTHROSCOPY Right    x2   LAPAROSCOPIC ASSISTED VAGINAL HYSTERECTOMY  10/11/2011   Procedure: LAPAROSCOPIC ASSISTED VAGINAL HYSTERECTOMY;  Surgeon: Meriel Pica, MD;  Location: [facility];  Service: Gynecology;  Laterality: N/A;   POLYPECTOMY  05/15/2018   Procedure: POLYPECTOMY;  Surgeon: Corbin Ade, MD;  [facility];  Service: Endoscopy;;  colon     MEDICATIONS:  baclofen (LIORESAL) 10 MG tablet   Cholecalciferol (VITAMIN D-3) 125 MCG (5000 UT) TABS   clonazePAM (KLONOPIN) 0.5 MG tablet   escitalopram (LEXAPRO) 10 MG tablet  meloxicam (MOBIC) 15 MG tablet   methocarbamol (ROBAXIN) 500 MG tablet   Misc Natural Products (TART CHERRY ADVANCED PO)   phentermine (ADIPEX-P) 37.5 MG tablet   pravastatin (PRAVACHOL) 10 MG tablet   TART CHERRY PO   thyroid (ARMOUR) 60 MG tablet   TURMERIC PO   No current facility-administered medications for this encounter.    Jodell Cipro Ward, PA-C [facility] Pre-Surgical Testing [phone]

## 2022-12-07 NOTE — Progress Notes (Signed)
Office Visit Note  Patient: [name]             Date of Birth: Dec 25, 1965           MRN: [id number]             PCP: Roe Rutherford, NP Referring: Roe Rutherford, NP Visit Date: 12/21/2022 Occupation: @GUAROCC @  Subjective:  Right knee replacement   History of Present Illness: TSERING LAMPKIN is a 57 y.o. female with history of nodular episcleritis and osteoarthritis.  Patient had a right knee total arthroplasty performed on 12/12/2022 by Dr. Charlann Boxer.  She has started physical therapy and is progressing without complication so far.  She is taking oxycodone, meloxicam, and methocarbamol for symptomatic relief.  She is using crutches to assist with ambulation.  Patient states that she has noticed significant bruising of the entire right leg.  She denies any other joint pain or joint swelling at this time. She denies any episcleritis flares recently.  She has been using lubricating eyedrops for dry eyes.  Patient states that her left ear duct is plugged and will require a referral to a specialist for surgical intervention.       Activities of Daily Living:  Patient reports morning stiffness for 1 hour.   Patient Reports nocturnal pain.  Difficulty dressing/grooming: Reports Difficulty climbing stairs: Reports Difficulty getting out of chair: Reports Difficulty using hands for taps, buttons, cutlery, and/or writing: Reports  Review of Systems  Constitutional:  Positive for fatigue.  HENT:  Positive for mouth dryness. Negative for mouth sores and nose dryness.   Eyes:  Negative for pain, visual disturbance and dryness.  Respiratory:  Negative for cough, hemoptysis, shortness of breath and difficulty breathing.   Cardiovascular:  Positive for palpitations. Negative for chest pain, hypertension and swelling in legs/feet.  Gastrointestinal:  Positive for constipation. Negative for blood in stool and diarrhea.  Endocrine: Negative for increased urination.  Genitourinary:   Negative for painful urination and involuntary urination.  Musculoskeletal:  Positive for joint pain, gait problem, joint pain, joint swelling, myalgias, morning stiffness, muscle tenderness and myalgias. Negative for muscle weakness.  Skin:  Positive for hair loss. Negative for color change, pallor, rash, nodules/bumps, skin tightness, ulcers and sensitivity to sunlight.  Allergic/Immunologic: Negative for susceptible to infections.  Neurological:  Negative for dizziness, numbness, headaches and weakness.  Hematological:  Negative for swollen glands.  Psychiatric/Behavioral:  Positive for sleep disturbance. Negative for depressed mood. The patient is not nervous/anxious.     PMFS History:  Patient Active Problem List   Diagnosis Date Noted   S/P total knee arthroplasty, right 12/12/2022   Hyperlipemia 05/12/2021   Chest pain of uncertain etiology 05/12/2021   Palpitation 04/22/2019   Fatigue 04/22/2019   Menopause 04/22/2019   Vitamin D deficiency disease 11/28/2018   Abdominal pain 03/21/2018   Nausea with vomiting 03/21/2018   Primary osteoarthritis of both knees 09/24/2017   Osteoarthritis of both knees 01/19/2016   Osteoarthritis of both feet 01/19/2016   History of TIA (transient ischemic attack) 01/19/2016   DDD (degenerative disc disease), lumbar 01/19/2016   Nodular episcleritis of both eyes 01/19/2016   Pain in right knee 01/19/2016   Leiomyoma 10/12/2011   Female pelvic pain 10/12/2011    Past Medical History:  Diagnosis Date   Anxiety    DDD (degenerative disc disease), lumbar 01/19/2016   Dysrhythmia    PACs, palpitations (Has Loop recorder)   Exposure to phentermine    uses currently  Family history of adverse reaction to anesthesia    son has N&V   History of TIA (transient ischemic attack) 01/19/2016   Hypothyroidism    Osteoarthritis of both feet 01/19/2016   Osteoarthritis of both knees 01/19/2016   PONV (postoperative nausea and vomiting)    Stroke  (HCC) 07/2010   "TIA" per patient   Stroke (HCC) 2018   TIA   Vitamin D deficiency disease 11/28/2018    Family History  Problem Relation Age of Onset   Anuerysm Mother    Heart attack Mother    Heart disease Mother    Diabetes Father    Hypertension Father    Macular degeneration Father    Diabetes Sister    Osteoarthritis Sister    Fibromyalgia Sister    Glaucoma Sister    Colon cancer Paternal Uncle    Past Surgical History:  Procedure Laterality Date   BIOPSY  05/15/2018   Procedure: BIOPSY;  Surgeon: Corbin Ade, MD;  Location: [facility];  Service: Endoscopy;;  polyp cb   CHOLECYSTECTOMY     COLONOSCOPY N/A 05/15/2018   Procedure: COLONOSCOPY;  Surgeon: Corbin Ade, MD;  Location: [facility];  Service: Endoscopy;  Laterality: N/A;  12:00pm   EYE SURGERY Bilateral    Lasik   implantable loop recorder placement  11/15/2019   Medtronic Reveal Linq model LNQ 22 ([place] [id number])  implantable loop recorder by Dr Johney Frame   KNEE ARTHROSCOPY Right    x2   LAPAROSCOPIC ASSISTED VAGINAL HYSTERECTOMY  10/11/2011   Procedure: LAPAROSCOPIC ASSISTED VAGINAL HYSTERECTOMY;  Surgeon: Meriel Pica, MD;  Location: [facility];  Service: Gynecology;  Laterality: N/A;   POLYPECTOMY  05/15/2018   Procedure: POLYPECTOMY;  Surgeon: Corbin Ade, MD;  Location: [facility];  Service: Endoscopy;;  colon    TOTAL KNEE ARTHROPLASTY Right 12/12/2022   Procedure: TOTAL KNEE ARTHROPLASTY;  Surgeon: Durene Romans, MD;  Location: [facility];  Service: Orthopedics;  Laterality: Right;   Social History   Social History Narrative   Lives with husband of 2 years(second)  and son/family.Works in Psychologist, forensic for a long time. Has 56 son 38 years old. Divorced x 1(previously married for 25 yrs), remarried <1. Has 2 dogs.   Immunization History  Administered Date(s) Administered   Influenza Inj Mdck Quad Pf 01/26/2020   Influenza,inj,Quad PF,6+ Mos 02/27/2019   Influenza-Unspecified  02/27/2019   Tdap 08/24/2017     Objective: Vital Signs: BP 134/87 (BP Location: Left Arm, Patient Position: Sitting, Cuff Size: Normal)   Pulse 66   Resp 15   Ht 5\' 2"  (1.575 m)   Wt 175 lb 6.4 oz (79.6 kg)   LMP 01/09/2011   BMI 32.08 kg/m    Physical Exam Vitals and nursing note reviewed.  Constitutional:      Appearance: She is well-developed.  HENT:     Head: Normocephalic and atraumatic.  Eyes:     Conjunctiva/sclera: Conjunctivae normal.  Cardiovascular:     Rate and Rhythm: Normal rate and regular rhythm.     Heart sounds: Normal heart sounds.  Pulmonary:     Effort: Pulmonary effort is normal.     Breath sounds: Normal breath sounds.  Abdominal:     General: Bowel sounds are normal.     Palpations: Abdomen is soft.  Musculoskeletal:     Cervical back: Normal range of motion.  Lymphadenopathy:     Cervical: No cervical adenopathy.  Skin:    General: Skin  is warm and dry.     Capillary Refill: Capillary refill takes less than 2 seconds.  Neurological:     Mental Status: She is alert and oriented to person, place, and time.  Psychiatric:        Behavior: Behavior normal.      Musculoskeletal Exam: Patient remained seated during the examination today.  C-spine has good ROM.  No midline spinal tenderness.  No SI joint tenderness.  Shoulder joints, elbow joints, wrist joints, MCPs, PIPs, and DIPs good ROM with no synovitis.  Complete fist formation bilaterally.  DIP prominences noted. Hip joints difficult to assess in seated position.  Right knee replacement has limited ROM.  Left knee has good ROM with no warmth or effusion.  Ankle joints have good ROM.    CDAI Exam: CDAI Score: -- Patient Global: --; Provider Global: -- Swollen: --; Tender: -- Joint Exam 12/21/2022   No joint exam has been documented for this visit   There is currently no information documented on the homunculus. Go to the Rheumatology activity and complete the homunculus joint  exam.  Investigation: No additional findings.  Imaging: No results found.  Recent Labs: Lab Results  Component Value Date   WBC 15.7 (H) 12/13/2022   HGB 10.2 (L) 12/13/2022   PLT 272 12/13/2022   NA 136 12/13/2022   K 3.8 12/13/2022   CL 104 12/13/2022   CO2 22 12/13/2022   GLUCOSE 140 (H) 12/13/2022   BUN 17 12/13/2022   CREATININE 0.39 (L) 12/13/2022   BILITOT 1.0 01/26/2020   ALKPHOS 50 07/13/2016   AST 11 01/26/2020   ALT 10 01/26/2020   PROT 7.0 01/26/2020   ALBUMIN 3.8 07/13/2016   CALCIUM 8.3 (L) 12/13/2022   GFRAA 124 01/26/2020    Speciality Comments: No specialty comments available.  Procedures:  No procedures performed Allergies: Celebrex [celecoxib]   Assessment / Plan:     Visit Diagnoses: Nodular episcleritis of both eyes: She has not had any signs or symptoms of an episcleritis flare.  No conjunctival injection or photophobia.  She has had increased eye dryness especially in the left eye due to a blocked tear duct.  She may require surgical intervention.   Patient was advised to notify us if she develops signs or symptoms of an episcleritis flare. She will follow up in 6 months or sooner if needed.   Primary osteoarthritis of both hands - 05/12/20: CRP WNL, ESR WNL, RF-, anti-CCP-, 14-3-3 eta negative. X-rays updated 04/2320 consistent with early osteoarthritic changes.  DIP prominence noted.  No synovitis or dactylitis noted.  Complete fist formation bilaterally.  Primary osteoarthritis of left knee: She has good range of motion of the left knee joint on examination today.  No warmth or effusion noted.  S/P total knee arthroplasty, right - Performed by Dr. Charlann Boxer on 12/12/22.  Currently going to PT.  Using crutches to assist with ambulation. She is taking oxycodone, methocarbamol, meloxicam for symptomatic relief.  Primary osteoarthritis of both feet: She is not experiencing any increased discomfort in her feet at this time.  She is wearing proper fitting  shoes.  Degeneration of intervertebral disc of lumbar region without discogenic back pain or lower extremity pain - Mild levoscoliosis and disc disease on the lumbar spine x-rays obtained on May 14, 2019.  Facet joint arthropathy.  No midline spinal tenderness at this time. No symptoms of radiculopathy at this time.  Chronic SI joint pain - Not currently symptomatic.  No SI joint tenderness  upon palpation today.  Other medical conditions are listed as follows:  History of TIA (transient ischemic attack)  History of cholecystectomy  Orders: No orders of the defined types were placed in this encounter.  No orders of the defined types were placed in this encounter.     Follow-Up Instructions: Return in about 6 months (around 06/21/2023) for Osteoarthritis.   Gearldine Bienenstock, PA-C  Note - This record has been created using Dragon software.  Chart creation errors have been sought, but may not always  have been located. Such creation errors do not reflect on  the standard of medical care.

## 2022-12-11 ENCOUNTER — Ambulatory Visit: Payer: 59

## 2022-12-11 NOTE — H&P (Signed)
TOTAL KNEE ADMISSION H&P  Patient is being admitted for right total knee arthroplasty.  Therapy Plans: outpatient therapy at [facility] Disposition: Home with husband and son Planned DVT Prophylaxis: aspirin 81mg  BID DME needed: walker PCP: Roe Rutherford Cardio: Dr. Nelly Laurence - clearance received TXA: IV Allergies: Anesthesia Concerns: none BMI: 30.1 Last HgbA1c: Not diabetic   Other: - Phentermine for bradycardia -- drops into 30s/40s - No hx of VTE or cancer - oxycodone, robaxin, tylenol, meloxicam - Staying overnight  Subjective:  Chief Complaint:right knee pain.  HPI: [name], 57 y.o. female, has a history of pain and functional disability in the right knee due to arthritis and has failed non-surgical conservative treatments for greater than 12 weeks to includeNSAID's and/or analgesics, corticosteriod injections, and activity modification.  Onset of symptoms was gradual, starting 2 years ago with gradually worsening course since that time. The patient noted prior procedures on the knee to include  arthroscopy and menisectomy on the right knee(s).  Patient currently rates pain in the right knee(s) at 8 out of 10 with activity. Patient has worsening of pain with activity and weight bearing, pain that interferes with activities of daily living, and pain with passive range of motion.  Patient has evidence of joint space narrowing by imaging studies. There is no active infection.  Patient Active Problem List   Diagnosis Date Noted   Hyperlipemia 05/12/2021   Chest pain of uncertain etiology 05/12/2021   Palpitation 04/22/2019   Fatigue 04/22/2019   Menopause 04/22/2019   Vitamin D deficiency disease 11/28/2018   Abdominal pain 03/21/2018   Nausea with vomiting 03/21/2018   Primary osteoarthritis of both knees 09/24/2017   Osteoarthritis of both knees 01/19/2016   Osteoarthritis of both feet 01/19/2016   History of TIA (transient ischemic attack) 01/19/2016   DDD  (degenerative disc disease), lumbar 01/19/2016   Nodular episcleritis of both eyes 01/19/2016   Pain in right knee 01/19/2016   Leiomyoma 10/12/2011   Female pelvic pain 10/12/2011   Past Medical History:  Diagnosis Date   Anxiety    DDD (degenerative disc disease), lumbar 01/19/2016   Dysrhythmia    PACs, palpitations (Has Loop recorder)   Exposure to phentermine    uses currently   Family history of adverse reaction to anesthesia    son has N&V   History of TIA (transient ischemic attack) 01/19/2016   Hypothyroidism    Osteoarthritis of both feet 01/19/2016   Osteoarthritis of both knees 01/19/2016   PONV (postoperative nausea and vomiting)    Stroke (HCC) 07/2010   "TIA" per patient   Stroke (HCC) 2018   TIA   Vitamin D deficiency disease 11/28/2018    Past Surgical History:  Procedure Laterality Date   BIOPSY  05/15/2018   Procedure: BIOPSY;  Surgeon: Corbin Ade, MD;  [facility]: [facility];  Service: Endoscopy;;  polyp cb   CHOLECYSTECTOMY     COLONOSCOPY N/A 05/15/2018   Procedure: COLONOSCOPY;  Surgeon: Corbin Ade, MD;  Location: [facility];  Service: Endoscopy;  Laterality: N/A;  12:00pm   EYE SURGERY Bilateral    Lasik   implantable loop recorder placement  11/15/2019   Medtronic Reveal Linq model LNQ 22 ([place] [id number])  implantable loop recorder by Dr Johney Frame   KNEE ARTHROSCOPY Right    x2   LAPAROSCOPIC ASSISTED VAGINAL HYSTERECTOMY  10/11/2011   Procedure: LAPAROSCOPIC ASSISTED VAGINAL HYSTERECTOMY;  Surgeon: Meriel Pica, MD;  Location: [facility];  Service: Gynecology;  Laterality: N/A;   POLYPECTOMY  05/15/2018   Procedure: POLYPECTOMY;  Surgeon: Corbin Ade, MD;  Location: [facility];  Service: Endoscopy;;  colon     No current facility-administered medications for this encounter.   Current Outpatient Medications  Medication Sig Dispense Refill Last Dose   Cholecalciferol (VITAMIN D-3) 125 MCG (5000 UT) TABS Take 5,000 Units by  mouth daily at 12 noon.      clonazePAM (KLONOPIN) 0.5 MG tablet Take 0.5 mg by mouth at bedtime.      escitalopram (LEXAPRO) 10 MG tablet TAKE 1 TABLET(10 MG) BY MOUTH DAILY 30 tablet 3    meloxicam (MOBIC) 15 MG tablet Take 15 mg by mouth daily.      methocarbamol (ROBAXIN) 500 MG tablet Take 1,000 mg by mouth every 8 (eight) hours as needed for muscle spasms.      Misc Natural Products (TART CHERRY ADVANCED PO) Take 1 capsule by mouth daily.       phentermine (ADIPEX-P) 37.5 MG tablet Take 0.5 tablets by mouth daily.      pravastatin (PRAVACHOL) 10 MG tablet TAKE 1 TABLET BY MOUTH EVERY EVENING 90 tablet 0    TART CHERRY PO Take 1 capsule by mouth daily.      thyroid (ARMOUR) 60 MG tablet Take 60 mg by mouth daily before breakfast.      TURMERIC PO Take 1 capsule by mouth daily.      baclofen (LIORESAL) 10 MG tablet TAKE 1 TABLET(10 MG) BY MOUTH DAILY AS NEEDED FOR MUSCLE SPASMS 30 tablet 2    Allergies  Allergen Reactions   Celebrex [Celecoxib] Swelling    Facial swelling    Social History   Tobacco Use   Smoking status: Never    Passive exposure: Past   Smokeless tobacco: Never  Substance Use Topics   Alcohol use: Yes    Comment: occasionally    Family History  Problem Relation Age of Onset   Anuerysm Mother    Heart attack Mother    Heart disease Mother    Diabetes Father    Hypertension Father    Macular degeneration Father    Diabetes Sister    Osteoarthritis Sister    Fibromyalgia Sister    Colon cancer Paternal Uncle      Review of Systems  Constitutional:  Negative for chills and fever.  Respiratory:  Negative for cough and shortness of breath.   Cardiovascular:  Negative for chest pain.  Gastrointestinal:  Negative for nausea and vomiting.  Musculoskeletal:  Positive for arthralgias.     Objective:  Physical Exam Well nourished and well developed. General: Alert and oriented x3, cooperative and pleasant, no acute distress. Head: normocephalic,  atraumatic, neck supple. Eyes: EOMI.  Musculoskeletal: Right Knee: No erythema, warmth, or effusion AROM 0-120 Lateral joint line tenderness Crepitus on ROM  Calves soft and nontender. Motor function intact in LE. Strength 5/5 LE bilaterally. Neuro: Distal pulses 2+. Sensation to light touch intact in LE.  Vital signs in last 24 hours:    Labs:   Estimated body mass index is 30.36 kg/m as calculated from the following:   Height as of 12/04/22: 5\' 2"  (1.575 m).   Weight as of 12/04/22: 75.3 kg.   Imaging Review Plain radiographs demonstrate severe degenerative joint disease of the right knee(s). The overall alignment isneutral. The bone quality appears to be adequate for age and reported activity level.      Assessment/Plan:  End stage arthritis, right knee  The patient history, physical examination, clinical judgment of the provider and imaging studies are consistent with end stage degenerative joint disease of the right knee(s) and total knee arthroplasty is deemed medically necessary. The treatment options including medical management, injection therapy arthroscopy and arthroplasty were discussed at length. The risks and benefits of total knee arthroplasty were presented and reviewed. The risks due to aseptic loosening, infection, stiffness, patella tracking problems, thromboembolic complications and other imponderables were discussed. The patient acknowledged the explanation, agreed to proceed with the plan and consent was signed. Patient is being admitted for inpatient treatment for surgery, pain control, PT, OT, prophylactic antibiotics, VTE prophylaxis, progressive ambulation and ADL's and discharge planning. The patient is planning to be discharged  home.     Patient's anticipated LOS is less than 2 midnights, meeting these requirements: - Younger than 16 - Lives within 1 hour of care - Has a competent adult at home to recover with post-op recover - NO history  of  - Chronic pain requiring opiods  - Diabetes  - Coronary Artery Disease  - Heart failure  - Heart attack  - Stroke  - DVT/VTE  - Cardiac arrhythmia  - Respiratory Failure/COPD  - Renal failure  - Anemia  - Advanced Liver disease  Rosalene Billings, PA-C Orthopedic Surgery [facility] [place] [phone]

## 2022-12-12 ENCOUNTER — Ambulatory Visit (HOSPITAL_BASED_OUTPATIENT_CLINIC_OR_DEPARTMENT_OTHER): Payer: 59 | Admitting: Anesthesiology

## 2022-12-12 ENCOUNTER — Observation Stay (HOSPITAL_COMMUNITY)
Admission: RE | Admit: 2022-12-12 | Discharge: 2022-12-13 | Disposition: A | Discharge status: Home / Self Care | Payer: 59 | Source: Ambulatory Visit | Attending: Orthopedic Surgery | Admitting: Orthopedic Surgery

## 2022-12-12 ENCOUNTER — Encounter (HOSPITAL_COMMUNITY)
Admission: RE | Disposition: A | Discharge status: Home / Self Care | Payer: Self-pay | Source: Ambulatory Visit | Attending: Orthopedic Surgery

## 2022-12-12 ENCOUNTER — Ambulatory Visit (HOSPITAL_COMMUNITY): Payer: 59 | Admitting: Physician Assistant

## 2022-12-12 ENCOUNTER — Encounter (HOSPITAL_COMMUNITY): Payer: Self-pay | Admitting: Orthopedic Surgery

## 2022-12-12 ENCOUNTER — Other Ambulatory Visit: Payer: Self-pay

## 2022-12-12 DIAGNOSIS — E039 Hypothyroidism, unspecified: Secondary | ICD-10-CM | POA: Insufficient documentation

## 2022-12-12 DIAGNOSIS — M1711 Unilateral primary osteoarthritis, right knee: Principal | ICD-10-CM | POA: Insufficient documentation

## 2022-12-12 DIAGNOSIS — Z8673 Personal history of transient ischemic attack (TIA), and cerebral infarction without residual deficits: Secondary | ICD-10-CM | POA: Diagnosis not present

## 2022-12-12 DIAGNOSIS — Z96651 Presence of right artificial knee joint: Principal | ICD-10-CM

## 2022-12-12 DIAGNOSIS — Z79899 Other long term (current) drug therapy: Secondary | ICD-10-CM | POA: Diagnosis not present

## 2022-12-12 HISTORY — PX: TOTAL KNEE ARTHROPLASTY: SHX125

## 2022-12-12 SURGERY — ARTHROPLASTY, KNEE, TOTAL
Anesthesia: Spinal | Site: Knee | Laterality: Right

## 2022-12-12 MED ORDER — SENNA 8.6 MG PO TABS
2.0000 | ORAL_TABLET | Freq: Every day | ORAL | Status: DC
  Administered 2022-12-12: 17.2 mg via ORAL
  Filled 2022-12-12: qty 2

## 2022-12-12 MED ORDER — ONDANSETRON HCL 4 MG/2ML IJ SOLN
INTRAMUSCULAR | Status: DC | PRN
  Administered 2022-12-12: 4 mg via INTRAVENOUS

## 2022-12-12 MED ORDER — ACETAMINOPHEN 500 MG PO TABS
1000.0000 mg | ORAL_TABLET | Freq: Once | ORAL | Status: AC
  Administered 2022-12-12: 1000 mg via ORAL
  Filled 2022-12-12: qty 2

## 2022-12-12 MED ORDER — KETOROLAC TROMETHAMINE 30 MG/ML IJ SOLN
INTRAMUSCULAR | Status: AC
  Filled 2022-12-12: qty 1

## 2022-12-12 MED ORDER — BUPIVACAINE IN DEXTROSE 0.75-8.25 % IT SOLN
INTRATHECAL | Status: DC | PRN
  Administered 2022-12-12: 1.5 mL via INTRATHECAL

## 2022-12-12 MED ORDER — KETOROLAC TROMETHAMINE 30 MG/ML IJ SOLN
INTRAMUSCULAR | Status: DC | PRN
  Administered 2022-12-12: 30 mg

## 2022-12-12 MED ORDER — PROPOFOL 500 MG/50ML IV EMUL
INTRAVENOUS | Status: DC | PRN
Start: 2022-12-12 — End: 2022-12-12
  Administered 2022-12-12: 50 ug/kg/min via INTRAVENOUS

## 2022-12-12 MED ORDER — LACTATED RINGERS IV BOLUS: 500.0000 mL | Freq: Once | INTRAVENOUS | Status: DC

## 2022-12-12 MED ORDER — DIPHENHYDRAMINE HCL 12.5 MG/5ML PO ELIX: 12.5000 mg | ORAL_SOLUTION | ORAL | Status: DC | PRN

## 2022-12-12 MED ORDER — CHLORHEXIDINE GLUCONATE 0.12 % MT SOLN
15.0000 mL | Freq: Once | OROMUCOSAL | Status: AC
  Administered 2022-12-12: 15 mL via OROMUCOSAL

## 2022-12-12 MED ORDER — STERILE WATER FOR IRRIGATION IR SOLN
Status: DC | PRN
  Administered 2022-12-12: 2000 mL

## 2022-12-12 MED ORDER — MEPERIDINE HCL 50 MG/ML IJ SOLN: 6.2500 mg | INTRAMUSCULAR | Status: DC | PRN

## 2022-12-12 MED ORDER — ACETAMINOPHEN 500 MG PO TABS
ORAL_TABLET | ORAL | Status: AC
  Filled 2022-12-12: qty 2

## 2022-12-12 MED ORDER — CEFAZOLIN SODIUM-DEXTROSE 2-4 GM/100ML-% IV SOLN
INTRAVENOUS | Status: AC
  Filled 2022-12-12: qty 100

## 2022-12-12 MED ORDER — ASPIRIN 81 MG PO CHEW
81.0000 mg | CHEWABLE_TABLET | Freq: Two times a day (BID) | ORAL | Status: DC
  Administered 2022-12-12 – 2022-12-13 (×2): 81 mg via ORAL
  Filled 2022-12-12 (×2): qty 1

## 2022-12-12 MED ORDER — METOCLOPRAMIDE HCL 5 MG/ML IJ SOLN: 5.0000 mg | Freq: Three times a day (TID) | INTRAMUSCULAR | Status: DC | PRN

## 2022-12-12 MED ORDER — ALUM & MAG HYDROXIDE-SIMETH 200-200-20 MG/5ML PO SUSP: 30.0000 mL | ORAL | Status: DC | PRN

## 2022-12-12 MED ORDER — METHOCARBAMOL 500 MG PO TABS: 500.0000 mg | ORAL_TABLET | Freq: Four times a day (QID) | ORAL | 2 refills | Status: AC | PRN

## 2022-12-12 MED ORDER — LACTATED RINGERS IV SOLN: INTRAVENOUS | Status: DC

## 2022-12-12 MED ORDER — METHOCARBAMOL 500 MG PO TABS
500.0000 mg | ORAL_TABLET | Freq: Four times a day (QID) | ORAL | Status: DC | PRN
  Administered 2022-12-12 – 2022-12-13 (×4): 500 mg via ORAL
  Filled 2022-12-12 (×3): qty 1

## 2022-12-12 MED ORDER — MELOXICAM 15 MG PO TABS
15.0000 mg | ORAL_TABLET | Freq: Every day | ORAL | Status: DC
  Administered 2022-12-13: 15 mg via ORAL
  Filled 2022-12-12: qty 1

## 2022-12-12 MED ORDER — ACETAMINOPHEN 500 MG PO TABS
1000.0000 mg | ORAL_TABLET | Freq: Four times a day (QID) | ORAL | Status: DC
  Administered 2022-12-12 – 2022-12-13 (×5): 1000 mg via ORAL
  Filled 2022-12-12 (×4): qty 2

## 2022-12-12 MED ORDER — BISACODYL 10 MG RE SUPP: 10.0000 mg | Freq: Every day | RECTAL | Status: DC | PRN

## 2022-12-12 MED ORDER — DEXAMETHASONE SODIUM PHOSPHATE 4 MG/ML IJ SOLN
INTRAMUSCULAR | Status: DC | PRN
  Administered 2022-12-12: 5 mg via PERINEURAL

## 2022-12-12 MED ORDER — TRANEXAMIC ACID-NACL 1000-0.7 MG/100ML-% IV SOLN
1000.0000 mg | INTRAVENOUS | Status: AC
  Administered 2022-12-12: 1000 mg via INTRAVENOUS
  Filled 2022-12-12: qty 100

## 2022-12-12 MED ORDER — CLONIDINE HCL (ANALGESIA) 100 MCG/ML EP SOLN
EPIDURAL | Status: DC | PRN
Start: 2022-12-12 — End: 2022-12-12
  Administered 2022-12-12: 80 ug

## 2022-12-12 MED ORDER — OXYCODONE HCL 5 MG PO TABS: 5.0000 mg | ORAL_TABLET | ORAL | 0 refills | Status: DC | PRN

## 2022-12-12 MED ORDER — 0.9 % SODIUM CHLORIDE (POUR BTL) OPTIME
TOPICAL | Status: DC | PRN
  Administered 2022-12-12: 1000 mL

## 2022-12-12 MED ORDER — HYDROMORPHONE HCL 1 MG/ML IJ SOLN
INTRAMUSCULAR | Status: AC
  Filled 2022-12-12: qty 1

## 2022-12-12 MED ORDER — DEXAMETHASONE SODIUM PHOSPHATE 10 MG/ML IJ SOLN
10.0000 mg | Freq: Once | INTRAMUSCULAR | Status: AC
  Administered 2022-12-13: 10 mg via INTRAVENOUS
  Filled 2022-12-12: qty 1

## 2022-12-12 MED ORDER — ONDANSETRON HCL 4 MG/2ML IJ SOLN
INTRAMUSCULAR | Status: AC
  Filled 2022-12-12: qty 2

## 2022-12-12 MED ORDER — ONDANSETRON HCL 4 MG PO TABS: 4.0000 mg | ORAL_TABLET | Freq: Four times a day (QID) | ORAL | Status: DC | PRN

## 2022-12-12 MED ORDER — OXYCODONE HCL 5 MG PO TABS
10.0000 mg | ORAL_TABLET | ORAL | Status: DC | PRN
  Administered 2022-12-13: 10 mg via ORAL
  Filled 2022-12-12: qty 2

## 2022-12-12 MED ORDER — PHENYLEPHRINE HCL (PRESSORS) 10 MG/ML IV SOLN
INTRAVENOUS | Status: DC | PRN
Start: 2022-12-12 — End: 2022-12-12
  Administered 2022-12-12: 80 ug via INTRAVENOUS

## 2022-12-12 MED ORDER — MIDAZOLAM HCL 2 MG/2ML IJ SOLN
INTRAMUSCULAR | Status: DC | PRN
  Administered 2022-12-12: 2 mg via INTRAVENOUS

## 2022-12-12 MED ORDER — OXYCODONE HCL 5 MG PO TABS
5.0000 mg | ORAL_TABLET | ORAL | Status: DC | PRN
  Administered 2022-12-12 (×2): 5 mg via ORAL
  Administered 2022-12-13 (×2): 10 mg via ORAL
  Filled 2022-12-12 (×2): qty 2
  Filled 2022-12-12 (×2): qty 1

## 2022-12-12 MED ORDER — DEXAMETHASONE SODIUM PHOSPHATE 10 MG/ML IJ SOLN
INTRAMUSCULAR | Status: AC
  Filled 2022-12-12: qty 1

## 2022-12-12 MED ORDER — LACTATED RINGERS IV BOLUS: 250.0000 mL | Freq: Once | INTRAVENOUS | Status: DC

## 2022-12-12 MED ORDER — CLONAZEPAM 0.5 MG PO TABS
0.5000 mg | ORAL_TABLET | Freq: Every day | ORAL | Status: DC
  Administered 2022-12-12: 0.5 mg via ORAL
  Filled 2022-12-12: qty 1

## 2022-12-12 MED ORDER — BUPIVACAINE-EPINEPHRINE 0.25% -1:200000 IJ SOLN
INTRAMUSCULAR | Status: AC
  Filled 2022-12-12: qty 1

## 2022-12-12 MED ORDER — FENTANYL CITRATE (PF) 100 MCG/2ML IJ SOLN
INTRAMUSCULAR | Status: DC | PRN
Start: 2022-12-12 — End: 2022-12-12
  Administered 2022-12-12 (×2): 50 ug via INTRAVENOUS

## 2022-12-12 MED ORDER — TRANEXAMIC ACID-NACL 1000-0.7 MG/100ML-% IV SOLN
INTRAVENOUS | Status: AC
  Filled 2022-12-12: qty 100

## 2022-12-12 MED ORDER — SODIUM CHLORIDE 0.9 % IV SOLN: INTRAVENOUS | Status: DC

## 2022-12-12 MED ORDER — HYDROMORPHONE HCL 1 MG/ML IJ SOLN
0.5000 mg | INTRAMUSCULAR | Status: DC | PRN
  Administered 2022-12-12: 1 mg via INTRAVENOUS

## 2022-12-12 MED ORDER — CEFAZOLIN SODIUM-DEXTROSE 2-4 GM/100ML-% IV SOLN
2.0000 g | Freq: Four times a day (QID) | INTRAVENOUS | Status: AC
  Administered 2022-12-12 (×2): 2 g via INTRAVENOUS
  Filled 2022-12-12: qty 100

## 2022-12-12 MED ORDER — THYROID 60 MG PO TABS
60.0000 mg | ORAL_TABLET | Freq: Every day | ORAL | Status: DC
  Administered 2022-12-13: 60 mg via ORAL
  Filled 2022-12-12: qty 1

## 2022-12-12 MED ORDER — BUPIVACAINE-EPINEPHRINE (PF) 0.25% -1:200000 IJ SOLN
INTRAMUSCULAR | Status: DC | PRN
  Administered 2022-12-12: 30 mL

## 2022-12-12 MED ORDER — METHOCARBAMOL 500 MG PO TABS
ORAL_TABLET | ORAL | Status: AC
  Filled 2022-12-12: qty 1

## 2022-12-12 MED ORDER — METOCLOPRAMIDE HCL 5 MG PO TABS: 5.0000 mg | ORAL_TABLET | Freq: Three times a day (TID) | ORAL | Status: DC | PRN

## 2022-12-12 MED ORDER — METHOCARBAMOL 500 MG IVPB - SIMPLE MED: 500.0000 mg | Freq: Four times a day (QID) | INTRAVENOUS | Status: DC | PRN

## 2022-12-12 MED ORDER — PRAVASTATIN SODIUM 20 MG PO TABS
10.0000 mg | ORAL_TABLET | Freq: Every evening | ORAL | Status: DC
  Administered 2022-12-12: 10 mg via ORAL
  Filled 2022-12-12: qty 1

## 2022-12-12 MED ORDER — PHENTERMINE HCL 37.5 MG PO TABS: 18.7500 mg | ORAL_TABLET | Freq: Every day | ORAL | Status: DC

## 2022-12-12 MED ORDER — PHENYLEPHRINE 80 MCG/ML (10ML) SYRINGE FOR IV PUSH (FOR BLOOD PRESSURE SUPPORT)
PREFILLED_SYRINGE | INTRAVENOUS | Status: AC
  Filled 2022-12-12: qty 10

## 2022-12-12 MED ORDER — HYDROMORPHONE HCL 1 MG/ML IJ SOLN: 0.2500 mg | INTRAMUSCULAR | Status: DC | PRN

## 2022-12-12 MED ORDER — ONDANSETRON HCL 4 MG/2ML IJ SOLN: 4.0000 mg | Freq: Four times a day (QID) | INTRAMUSCULAR | Status: DC | PRN

## 2022-12-12 MED ORDER — FENTANYL CITRATE (PF) 100 MCG/2ML IJ SOLN
INTRAMUSCULAR | Status: AC
  Filled 2022-12-12: qty 2

## 2022-12-12 MED ORDER — POLYETHYLENE GLYCOL 3350 17 G PO PACK
17.0000 g | PACK | Freq: Two times a day (BID) | ORAL | Status: DC
  Administered 2022-12-12 – 2022-12-13 (×2): 17 g via ORAL
  Filled 2022-12-12 (×2): qty 1

## 2022-12-12 MED ORDER — ROPIVACAINE HCL 7.5 MG/ML IJ SOLN
INTRAMUSCULAR | Status: DC | PRN
  Administered 2022-12-12: 20 mL via PERINEURAL

## 2022-12-12 MED ORDER — ORAL CARE MOUTH RINSE: 15.0000 mL | Freq: Once | OROMUCOSAL | Status: AC

## 2022-12-12 MED ORDER — DEXAMETHASONE SODIUM PHOSPHATE 10 MG/ML IJ SOLN
8.0000 mg | Freq: Once | INTRAMUSCULAR | Status: AC
  Administered 2022-12-12: 10 mg via INTRAVENOUS

## 2022-12-12 MED ORDER — TRANEXAMIC ACID-NACL 1000-0.7 MG/100ML-% IV SOLN
1000.0000 mg | Freq: Once | INTRAVENOUS | Status: AC
  Administered 2022-12-12: 1000 mg via INTRAVENOUS

## 2022-12-12 MED ORDER — GABAPENTIN 300 MG PO CAPS
300.0000 mg | ORAL_CAPSULE | Freq: Once | ORAL | Status: AC
  Administered 2022-12-12: 300 mg via ORAL
  Filled 2022-12-12: qty 1

## 2022-12-12 MED ORDER — MIDAZOLAM HCL 2 MG/2ML IJ SOLN
INTRAMUSCULAR | Status: AC
  Filled 2022-12-12: qty 2

## 2022-12-12 MED ORDER — SODIUM CHLORIDE (PF) 0.9 % IJ SOLN
INTRAMUSCULAR | Status: DC | PRN
  Administered 2022-12-12: 30 mL

## 2022-12-12 MED ORDER — MENTHOL 3 MG MT LOZG: 1.0000 | LOZENGE | OROMUCOSAL | Status: DC | PRN

## 2022-12-12 MED ORDER — POVIDONE-IODINE 10 % EX SWAB: 2.0000 | Freq: Once | CUTANEOUS | Status: DC

## 2022-12-12 MED ORDER — PROMETHAZINE HCL 25 MG/ML IJ SOLN: 6.2500 mg | INTRAMUSCULAR | Status: DC | PRN

## 2022-12-12 MED ORDER — CEFAZOLIN SODIUM-DEXTROSE 2-4 GM/100ML-% IV SOLN
2.0000 g | INTRAVENOUS | Status: AC
  Administered 2022-12-12: 2 g via INTRAVENOUS
  Filled 2022-12-12: qty 100

## 2022-12-12 MED ORDER — CEFAZOLIN SODIUM-DEXTROSE 2-4 GM/100ML-% IV SOLN: 2.0000 g | Freq: Four times a day (QID) | INTRAVENOUS | Status: DC

## 2022-12-12 MED ORDER — SODIUM CHLORIDE (PF) 0.9 % IJ SOLN
INTRAMUSCULAR | Status: AC
  Filled 2022-12-12: qty 30

## 2022-12-12 MED ORDER — DROPERIDOL 2.5 MG/ML IJ SOLN: 0.6250 mg | Freq: Once | INTRAMUSCULAR | Status: DC | PRN

## 2022-12-12 MED ORDER — SODIUM CHLORIDE 0.9 % IR SOLN
Status: DC | PRN
  Administered 2022-12-12: 1000 mL

## 2022-12-12 MED ORDER — PROPOFOL 1000 MG/100ML IV EMUL
INTRAVENOUS | Status: AC
  Filled 2022-12-12: qty 100

## 2022-12-12 MED ORDER — PHENOL 1.4 % MT LIQD: 1.0000 | OROMUCOSAL | Status: DC | PRN

## 2022-12-12 MED ORDER — ESCITALOPRAM OXALATE 10 MG PO TABS
10.0000 mg | ORAL_TABLET | Freq: Every day | ORAL | Status: DC
  Administered 2022-12-13: 10 mg via ORAL
  Filled 2022-12-12: qty 1

## 2022-12-12 SURGICAL SUPPLY — 59 items
ADH SKN CLS APL DERMABOND .7 (GAUZE/BANDAGES/DRESSINGS) ×1
ADH SKN CLS LQ APL DERMABOND (GAUZE/BANDAGES/DRESSINGS) ×1
ATTUNE MED ANAT PAT 32 KNEE (Knees) IMPLANT
BAG COUNTER SPONGE SURGICOUNT (BAG) IMPLANT
BAG SPEC THK2 15X12 ZIP CLS (MISCELLANEOUS)
BAG SPNG CNTER NS LX DISP (BAG)
BAG ZIPLOCK 12X15 (MISCELLANEOUS) IMPLANT
BASEPLATE TIB CMT FB PCKT SZ3 (Knees) IMPLANT
BLADE SAW SGTL 13.0X1.19X90.0M (BLADE) ×1 IMPLANT
BNDG CMPR 6 X 5 YARDS HK CLSR (GAUZE/BANDAGES/DRESSINGS) ×1
BNDG CMPR MED 10X6 ELC LF (GAUZE/BANDAGES/DRESSINGS) ×1
BNDG ELASTIC 6INX 5YD STR LF (GAUZE/BANDAGES/DRESSINGS) ×1 IMPLANT
BNDG ELASTIC 6X10 VLCR STRL LF (GAUZE/BANDAGES/DRESSINGS) IMPLANT
BOWL SMART MIX CTS (DISPOSABLE) ×1 IMPLANT
BSPLAT TIB 3 CMNT FXBRNG STRL (Knees) ×1 IMPLANT
CEMENT HV SMART SET (Cement) IMPLANT
COMP FEM CMT ATTUNE NRW 3 RT (Joint) ×1 IMPLANT
COMPONENT FEM CMT ATTN NRW 3RT (Joint) IMPLANT
COVER SURGICAL LIGHT HANDLE (MISCELLANEOUS) ×1 IMPLANT
CUFF TOURN SGL QUICK 34 (TOURNIQUET CUFF) ×1
CUFF TRNQT CYL 34X4.125X (TOURNIQUET CUFF) ×1 IMPLANT
DERMABOND ADVANCED .7 DNX12 (GAUZE/BANDAGES/DRESSINGS) ×1 IMPLANT
DERMABOND ADVANCED .7 DNX6 (GAUZE/BANDAGES/DRESSINGS) IMPLANT
DRAPE U-SHAPE 47X51 STRL (DRAPES) ×1 IMPLANT
DRESSING AQUACEL AG SP 3.5X10 (GAUZE/BANDAGES/DRESSINGS) ×1 IMPLANT
DRSG AQUACEL AG ADV 3.5X10 (GAUZE/BANDAGES/DRESSINGS) IMPLANT
DRSG AQUACEL AG SP 3.5X10 (GAUZE/BANDAGES/DRESSINGS) ×1
DURAPREP 26ML APPLICATOR (WOUND CARE) ×2 IMPLANT
ELECT REM PT RETURN 15FT ADLT (MISCELLANEOUS) ×1 IMPLANT
GLOVE BIO SURGEON STRL SZ 6 (GLOVE) ×1 IMPLANT
GLOVE BIOGEL PI IND STRL 6.5 (GLOVE) ×1 IMPLANT
GLOVE BIOGEL PI IND STRL 7.5 (GLOVE) ×1 IMPLANT
GLOVE ORTHO TXT STRL SZ7.5 (GLOVE) ×2 IMPLANT
GOWN STRL REUS W/ TWL LRG LVL3 (GOWN DISPOSABLE) ×2 IMPLANT
GOWN STRL REUS W/TWL LRG LVL3 (GOWN DISPOSABLE) ×2
HANDPIECE INTERPULSE COAX TIP (DISPOSABLE) ×1
HOLDER FOLEY CATH W/STRAP (MISCELLANEOUS) IMPLANT
INSERT TIB CMT ATTUNE 3 5 RT (Insert) IMPLANT
KIT TURNOVER KIT A (KITS) IMPLANT
MANIFOLD NEPTUNE II (INSTRUMENTS) ×1 IMPLANT
NDL SAFETY ECLIP 18X1.5 (MISCELLANEOUS) IMPLANT
NS IRRIG 1000ML POUR BTL (IV SOLUTION) ×1 IMPLANT
PACK TOTAL KNEE CUSTOM (KITS) ×1 IMPLANT
PIN FIX SIGMA LCS THRD HI (PIN) IMPLANT
PROTECTOR NERVE ULNAR (MISCELLANEOUS) ×1 IMPLANT
SET HNDPC FAN SPRY TIP SCT (DISPOSABLE) ×1 IMPLANT
SET PAD KNEE POSITIONER (MISCELLANEOUS) ×1 IMPLANT
SPIKE FLUID TRANSFER (MISCELLANEOUS) ×2 IMPLANT
SUT MNCRL AB 4-0 PS2 18 (SUTURE) ×1 IMPLANT
SUT STRATAFIX PDS+ 0 24IN (SUTURE) ×1 IMPLANT
SUT VIC AB 1 CT1 36 (SUTURE) ×1 IMPLANT
SUT VIC AB 2-0 CT1 27 (SUTURE) ×2
SUT VIC AB 2-0 CT1 TAPERPNT 27 (SUTURE) ×2 IMPLANT
SYR 3ML LL SCALE MARK (SYRINGE) ×1 IMPLANT
TOWEL GREEN STERILE FF (TOWEL DISPOSABLE) ×1 IMPLANT
TRAY FOLEY MTR SLVR 16FR STAT (SET/KITS/TRAYS/PACK) ×1 IMPLANT
TUBE SUCTION HIGH CAP CLEAR NV (SUCTIONS) ×1 IMPLANT
WATER STERILE IRR 1000ML POUR (IV SOLUTION) ×2 IMPLANT
WRAP KNEE MAXI GEL POST OP (GAUZE/BANDAGES/DRESSINGS) ×1 IMPLANT

## 2022-12-12 NOTE — Anesthesia Procedure Notes (Signed)
Anesthesia Regional Block: Adductor canal block   Pre-Anesthetic Checklist: , timeout performed,  Correct Patient, Correct Site, Correct Laterality,  Correct Procedure, Correct Position, site marked,  Risks and benefits discussed,  Surgical consent,  Pre-op evaluation,  At surgeon's request and post-op pain management  Laterality: Lower and Right  Prep: chloraprep       Needles:  Injection technique: Single-shot  Needle Type: Stimiplex     Needle Length: 9cm  Needle Gauge: 21     Additional Needles:   Procedures:,,,, ultrasound used (permanent image in chart),,    Narrative:  Start time: 12/12/2022 6:54 AM End time: 12/12/2022 7:14 AM Injection made incrementally with aspirations every 5 mL.  Performed by: Personally  Anesthesiologist: Lewie Loron, MD  Additional Notes: BP cuff, EKG monitors applied. Sedation begun. Artery and nerve location verified with ultrasound. Anesthetic injected incrementally (5ml), slowly, and after negative aspirations under direct u/s guidance. Good fascial/perineural spread. Tolerated well.

## 2022-12-12 NOTE — Anesthesia Procedure Notes (Addendum)
Spinal  Patient location during procedure: OR Start time: 12/12/2022 7:30 AM End time: 12/12/2022 7:34 AM Reason for block: surgical anesthesia Staffing Performed: anesthesiologist  Anesthesiologist: Lewie Loron, MD Performed by: Lewie Loron, MD Authorized by: Lewie Loron, MD   Preanesthetic Checklist Completed: patient identified, IV checked, site marked, risks and benefits discussed, surgical consent, monitors and equipment checked, pre-op evaluation and timeout performed Spinal Block Patient position: sitting Prep: DuraPrep and site prepped and draped Patient monitoring: heart rate, continuous pulse ox and blood pressure Approach: midline Location: L3-4 Injection technique: single-shot Needle Needle type: Spinocan  Needle gauge: 25 G Needle length: 9 cm Additional Notes Expiration date of kit checked and confirmed. Patient tolerated procedure well, without complications.

## 2022-12-12 NOTE — Evaluation (Signed)
Physical Therapy Evaluation Patient Details Name: [name] MRN: [id number] DOB: 05-03-1965 Today's Date: 12/12/2022  History of Present Illness  57 yo female presents to therapy s/p R TKA on 12/12/2022 due to failure of conservative measures. Pt PMH includes but is not limited to: HLD, TIA, CVA (2012 and 2018),  lumbar DDD, leiomyoma, and dysrhythmia with loop recorder in situ.  Clinical Impression     [name] is a 57 y.o. female POD 0 s/p R TKA. Patient reports IND with mobility at baseline. Patient is now limited by functional impairments (see PT problem list below) and requires CGA for bed mobility and mod for transfers and unable to safely progress with gait or step navigation instruction at time of eval due to R LE instability attributed to slow regression of anesthesia. Patient instructed in exercise to facilitate ROM and circulation to manage edema in supine. PT to return later in day to re-assess pt safety and IND with functional mobility in order to progress toward same day d/c. Patient will benefit from continued skilled PT interventions to address impairments and progress towards PLOF. Acute PT will follow to progress mobility and stair training in preparation for safe discharge home with family support and OPPT services.      If plan is discharge home, recommend the following: A Bendall help with walking and/or transfers;A Micheals help with bathing/dressing/bathroom;Assistance with cooking/housework;Assist for transportation;Help with stairs or ramp for entrance   Can travel by private vehicle        Equipment Recommendations Rolling walker (2 wheels)  Recommendations for Other Services       Functional Status Assessment Patient has had a recent decline in their functional status and demonstrates the ability to make significant improvements in function in a reasonable and predictable amount of time.     Precautions / Restrictions Precautions Precautions:  Knee;Fall Restrictions Weight Bearing Restrictions: No      Mobility  Bed Mobility Overal bed mobility: Needs Assistance Bed Mobility: Supine to Sit     Supine to sit: Contact guard, HOB elevated     General bed mobility comments: min cues    Transfers Overall transfer level: Needs assistance Equipment used: Rolling walker (2 wheels) Transfers: Sit to/from Stand, Bed to chair/wheelchair/BSC Sit to Stand: Mod assist, From elevated surface Stand pivot transfers: Mod assist         General transfer comment: pt able to perform sit to stand from EOB with CGA, however with R LE weight acceptance noted R LE instability requiring PT to provide total A to guide pt back to EOB, pt indicated need to void bladder and mod A for SPT bed to [facility] with A x 2 for safety with nurse tech and nurse and min A for SPT from [facility] to recliner with RW and pt limiting RLE WB with use of B UE support at RW.    Ambulation/Gait               General Gait Details: NT due to R LE instabilty with weight acceptance  Stairs            Wheelchair Mobility     Tilt Bed    Modified Rankin (Stroke Patients Only)       Balance  Pertinent Vitals/Pain Pain Assessment Pain Assessment: 0-10 Pain Score: 2  (pt c/o nagging HA) Pain Location: HA, no reports of R LE pain Pain Descriptors / Indicators: Dull Pain Intervention(s): RN gave pain meds during session, Limited activity within patient's tolerance, Monitored during session, Ice applied (ice to R knee)    Home Living Family/patient expects to be discharged to:: Private residence Living Arrangements: Spouse/significant other;Children Available Help at Discharge: Family Type of Home: House Home Access: Stairs to enter Entrance Stairs-Rails: None Entrance Stairs-Number of Steps: 1 Alternate Level Stairs-Number of Steps: 7 Home Layout: Bed/bath upstairs;Multi-level Home  Equipment: Shower seat (walking stick)      Prior Function Prior Level of Function : Independent/Modified Independent;Driving;Working/employed             Mobility Comments: IND with all ADLs, self care tasks, IADLs no AD       Extremity/Trunk Assessment        Lower Extremity Assessment Lower Extremity Assessment: RLE deficits/detail RLE Deficits / Details: ankle DF/PF 5/5; SLR < 10 degree lag. pt exhibited R LE instability with weight acceptance with pre-gait assessment and with functional transfers attributed to slow regression of anesthesia RLE Sensation: decreased light touch    Cervical / Trunk Assessment Cervical / Trunk Assessment: Normal  Communication   Communication Communication: No apparent difficulties  Cognition Arousal: Alert Behavior During Therapy: WFL for tasks assessed/performed Overall Cognitive Status: Within Functional Limits for tasks assessed                                          General Comments      Exercises Total Joint Exercises Ankle Circles/Pumps: AROM Quad Sets: AROM, Right, 5 reps Short Arc Quad: AROM, Right, 5 reps Heel Slides: AROM, Right, 5 reps Hip ABduction/ADduction: AROM, Right, 5 reps, Supine Straight Leg Raises: AROM, Right, 5 reps   Assessment/Plan    PT Assessment Patient needs continued PT services  PT Problem List Decreased strength;Decreased range of motion;Decreased activity tolerance;Decreased balance;Decreased mobility;Decreased coordination;Pain       PT Treatment Interventions DME instruction;Gait training;Stair training;Functional mobility training;Therapeutic activities;Therapeutic exercise;Balance training;Neuromuscular re-education;Patient/family education;Modalities    PT Goals (Current goals can be found in the Care Plan section)  Acute Rehab PT Goals Patient Stated Goal: to be able to squat down and get back up, walk without pain and keep up with the grandchildren PT Goal  Formulation: With patient Time For Goal Achievement: 12/26/22    Frequency 7X/week     Co-evaluation               AM-PAC PT "6 Clicks" Mobility  Outcome Measure Help needed turning from your back to your side while in a flat bed without using bedrails?: None Help needed moving from lying on your back to sitting on the side of a flat bed without using bedrails?: A Baldi Help needed moving to and from a bed to a chair (including a wheelchair)?: A Lot Help needed standing up from a chair using your arms (e.g., wheelchair or bedside chair)?: A Lot Help needed to walk in hospital room?: Total Help needed climbing 3-5 steps with a railing? : Total 6 Click Score: 13    End of Session Equipment Utilized During Treatment: Gait belt Activity Tolerance: Treatment limited secondary to medical complications (Comment) (R LE poor motor control and coordination and stability with weight acceptance) Patient left: in chair;with call  bell/phone within reach;with family/visitor present Nurse Communication: Mobility status;Other (comment) (pt not currently appropriate for same day d/c) PT Visit Diagnosis: Unsteadiness on feet (R26.81);Other abnormalities of gait and mobility (R26.89);Muscle weakness (generalized) (M62.81);Pain;Difficulty in walking, not elsewhere classified (R26.2)    Time: 3086-5784 PT Time Calculation (min) (ACUTE ONLY): 41 min   Charges:   PT Evaluation $PT Eval Low Complexity: 1 Low PT Treatments $Therapeutic Exercise: 8-22 mins $Therapeutic Activity: 8-22 mins PT General Charges $$ ACUTE PT VISIT: 1 Visit         Johnny Bridge, PT Acute Rehab   Jacqualyn Posey 12/12/2022, 1:44 PM

## 2022-12-12 NOTE — Progress Notes (Signed)
Physical Therapy Treatment Patient Details Name: [name] MRN: [id number] DOB: 01-14-1966 Today's Date: 12/12/2022   History of Present Illness 57 yo female presents to therapy s/p R TKA on 12/12/2022 due to failure of conservative measures. Pt PMH includes but is not limited to: HLD, TIA, CVA (2012 and 2018),  lumbar DDD, leiomyoma, and dysrhythmia with loop recorder in situ.    PT Comments   [name] is a 57 y.o. female POD 0 s/p R TKA. Patient reports IND with mobility at baseline. Patient is now limited by functional impairments (see PT problem list below). PT returned 2 hrs s/p evaluation to re-assess pt for d/c. Pt required min A and cues for transfers from recliner and unable to safely progress with gait or step navigation instruction at time of eval due to R LE instability attributed to slow regression of anesthesia. PT noted with SLR pt exhibited > 10 degree lag and required AA for SLR and for SAQ which pt was able to complete at time of eval. Pt indicating she is starting to feel pain in R knee especially posteriorly. PT to return later in day to re-assess pt safety and IND with functional mobility in order to progress toward same day d/c. Patient will benefit from continued skilled PT interventions to address impairments and progress towards PLOF. Acute PT will follow to progress mobility and stair training in preparation for safe discharge home with family support and OPPT services.    If plan is discharge home, recommend the following: A Wecker help with walking and/or transfers;A Roman[place]ello help with bathing/dressing/bathroom;Assistance with cooking/housework;Assist for transportation;Help with stairs or ramp for entrance   Can travel by private vehicle        Equipment Recommendations  Rolling walker (2 wheels)    Recommendations for Other Services       Precautions / Restrictions Precautions Precautions: Knee;Fall Restrictions Weight Bearing Restrictions: No      Mobility  Bed Mobility Overal bed mobility: Needs Assistance Bed Mobility: Supine to Sit     Supine to sit: Contact guard, HOB elevated     General bed mobility comments: pt seated in recliner when PT retuned    Transfers Overall transfer level: Needs assistance Equipment used: Rolling walker (2 wheels) Transfers: Sit to/from Stand Sit to Stand: Min assist Stand pivot transfers: Mod assist         General transfer comment: pt required min A for sit to stand with cues from the recliner, assessed for R LE stability with weight shifting and pt exhibited R LE instability with weight acceptance and unable to progress toward gait and step training    Ambulation/Gait               General Gait Details: NT due to R LE instabilty with weight acceptance   Stairs             Wheelchair Mobility     Tilt Bed    Modified Rankin (Stroke Patients Only)       Balance                                            Cognition Arousal: Alert Behavior During Therapy: WFL for tasks assessed/performed Overall Cognitive Status: Within Functional Limits for tasks assessed  Exercises Total Joint Exercises Ankle Circles/Pumps: AROM Quad Sets: AROM, Right, 5 reps Short Arc Quad: Right, 5 reps, AAROM Heel Slides: AROM, Right, 5 reps Hip ABduction/ADduction: AROM, Right, 5 reps, Supine Straight Leg Raises: Right, 5 reps, AAROM    General Comments        Pertinent Vitals/Pain Pain Assessment Pain Assessment: 0-10 Pain Score: 3  Pain Location: R LE Pain Descriptors / Indicators: Dull, Aching, Operative site guarding Pain Intervention(s): Repositioned, Premedicated before session, Monitored during session, Limited activity within patient's tolerance    Home Living                          Prior Function            PT Goals (current goals can now be found in the care plan  section) Acute Rehab PT Goals Patient Stated Goal: to be able to squat down and get back up, walk without pain and keep up with the grandchildren PT Goal Formulation: With patient Time For Goal Achievement: 12/26/22 Progress towards PT goals: Not progressing toward goals - comment (due to R LE instability)    Frequency    7X/week      PT Plan      Co-evaluation              AM-PAC PT "6 Clicks" Mobility   Outcome Measure  Help needed turning from your back to your side while in a flat bed without using bedrails?: None Help needed moving from lying on your back to sitting on the side of a flat bed without using bedrails?: A Sees Help needed moving to and from a bed to a chair (including a wheelchair)?: A Lot Help needed standing up from a chair using your arms (e.g., wheelchair or bedside chair)?: A Lot Help needed to walk in hospital room?: Total Help needed climbing 3-5 steps with a railing? : Total 6 Click Score: 13    End of Session Equipment Utilized During Treatment: Gait belt Activity Tolerance: Treatment limited secondary to medical complications (Comment) (R LE poor motor control and coordination and stability with weight acceptance) Patient left: in chair;with call bell/phone within reach;with family/visitor present Nurse Communication: Mobility status;Other (comment) (pt not currently appropriate for same day d/c) PT Visit Diagnosis: Unsteadiness on feet (R26.81);Other abnormalities of gait and mobility (R26.89);Muscle weakness (generalized) (M62.81);Pain;Difficulty in walking, not elsewhere classified (R26.2)     Time: 1610-9604 PT Time Calculation (min) (ACUTE ONLY): 11 min  Charges:    $Therapeutic Activity: 8-22 mins PT General Charges $$ ACUTE PT VISIT: 1 Visit                     Johnny Bridge, PT Acute Rehab    Jacqualyn Posey 12/12/2022, 3:55 PM

## 2022-12-12 NOTE — Op Note (Signed)
NAME:  [name]                      MEDICAL RECORD NO.:  [id number]                             FACILITY:  [facility]      PHYSICIAN:  Madlyn Frankel. Charlann Boxer, M.D.  DATE OF BIRTH:  1965/11/17      DATE OF PROCEDURE:  12/12/2022                                     OPERATIVE REPORT         PREOPERATIVE DIAGNOSIS:  Right knee osteoarthritis.      POSTOPERATIVE DIAGNOSIS:  Right knee osteoarthritis.      FINDINGS:  The patient was noted to have complete loss of cartilage and   bone-on-bone arthritis with associated osteophytes in the lateral and patellofemoral compartments of   the knee with a significant synovitis and associated effusion.  The patient had failed months of conservative treatment including medications, injection therapy, activity modification.     PROCEDURE:  Right total knee replacement.      COMPONENTS USED:  DePuy Attune FB CR MS knee   system, a size 3N femur, 3 tibia, size 5 mm CR MS AOX insert, and 32 anatomic patellar   button.      SURGEON:  Madlyn Frankel. Charlann Boxer, M.D.      ASSISTANT:  Rosalene Billings, PA-C.      ANESTHESIA:  Regional and Spinal.      SPECIMENS:  None.      COMPLICATION:  None.      DRAINS:  None.  EBL: <100 cc      TOURNIQUET TIME:   Total Tourniquet Time Documented: area (laterality) - 26 minutes Total: area (laterality) - 26 minutes  .      The patient was stable to the recovery room.      INDICATION FOR PROCEDURE:  [name] is a 57 y.o. female patient of   mine.  The patient had been seen, evaluated, and treated for months conservatively in the   office with medication, activity modification, and injections.  The patient had   radiographic changes of bone-on-bone arthritis with endplate sclerosis and osteophytes noted.  Based on the radiographic changes and failed conservative measures, the patient   decided to proceed with definitive treatment, total knee replacement.  Risks of infection, DVT, component failure, need for  revision surgery, neurovascular injury were reviewed in the office setting.  The postop course was reviewed stressing the efforts to maximize post-operative satisfaction and function.  Consent was obtained for benefit of pain   relief.      PROCEDURE IN DETAIL:  The patient was brought to the operative theater.   Once adequate anesthesia, preoperative antibiotics, 2 gm of Ancef,1 gm of Tranexamic Acid, and 10 mg of Decadron administered, the patient was positioned supine with a right thigh tourniquet placed.  The  right lower extremity was prepped and draped in sterile fashion.  A time-   out was performed identifying the patient, planned procedure, and the appropriate extremity.      The right lower extremity was placed in the [facility] leg holder.  The leg was   exsanguinated, tourniquet elevated to 225 mmHg.  A midline incision was  made followed by median parapatellar arthrotomy.  Following initial   exposure, attention was first directed to the patella.  Precut   measurement was noted to be 22 mm.  I resected down to 13-14 mm and used a   32 anatomic patellar button to restore patellar height as well as cover the cut surface.      The lug holes were drilled and a metal shim was placed to protect the   patella from retractors and saw blade during the procedure.      At this point, attention was now directed to the femur.  The femoral   canal was opened with a drill, irrigated to try to prevent fat emboli.  An   intramedullary rod was passed at 3 degrees valgus, 9 mm of bone was   resected off the distal femur.  Following this resection, the tibia was   subluxated anteriorly.  Using the extramedullary guide, 2 mm of bone was resected off   the proximal lateral tibia.  We confirmed the gap would be   stable medially and laterally with a size 5 spacer block as well as confirmed that the tibial cut was perpendicular in the coronal plane, checking with an alignment rod.      Once this was  done, I sized the femur to be a size 3 in the anterior-   posterior dimension, chose a narrow component based on medial and   lateral dimension.  The size 3 rotation block was then pinned in   position anterior referenced using the C-clamp to set rotation.  The   anterior, posterior, and  chamfer cuts were made without difficulty nor   notching making certain that I was along the anterior cortex to help   with flexion gap stability.      The final box cut was made off the lateral aspect of distal femur.      At this point, the tibia was sized to be a size 3.  The size 3 tray was   then pinned in position through the medial third of the tubercle,   drilled, and keel punched.  Trial reduction was now carried with a 3 femur,  3 tibia, a size 5 mm CR MS insert, and the 32 anatomic patella botton.  The knee was brought to full extension with good flexion stability with the patella   tracking through the trochlea without application of pressure.  Given   all these findings the trial components removed.  Final components were   opened and cement was mixed.  The knee was irrigated with normal saline solution and pulse lavage.  The synovial lining was   then injected with 30 cc of 0.25% Marcaine with epinephrine, 1 cc of Toradol and 30 cc of NS for a total of 61 cc.     Final implants were then cemented onto cleaned and dried cut surfaces of bone with the knee brought to extension with a size 5 mm CR MS trial insert.      Once the cement had fully cured, excess cement was removed   throughout the knee.  I confirmed that I was satisfied with the range of   motion and stability, and the final size 5 mm CR MS AOX insert was chosen.  It was   placed into the knee.      The tourniquet had been let down at 26 minutes.  No significant   hemostasis was required.  The extensor mechanism was then reapproximated  using #1 Vicryl and #1 Stratafix sutures with the knee   in flexion.  The   remaining wound was  closed with 2-0 Vicryl and running 4-0 Monocryl.   The knee was cleaned, dried, dressed sterilely using Dermabond and   Aquacel dressing.  The patient was then   brought to recovery room in stable condition, tolerating the procedure   well.   Please note that Physician Assistant, Rosalene Billings, PA-C was present for the entirety of the case, and was utilized for pre-operative positioning, peri-operative retractor management, general facilitation of the procedure and for primary wound closure at the end of the case.              Madlyn Frankel Charlann Boxer, M.D.    12/12/2022 8:45 AM

## 2022-12-12 NOTE — Anesthesia Postprocedure Evaluation (Signed)
Anesthesia Post Note  Patient: [name]  Procedure(s) Performed: TOTAL KNEE ARTHROPLASTY (Right: Knee)     Patient location during evaluation: PACU Anesthesia Type: Spinal Level of consciousness: awake and alert Pain management: pain level controlled Vital Signs Assessment: post-procedure vital signs reviewed and stable Respiratory status: spontaneous breathing Cardiovascular status: stable Anesthetic complications: no   No notable events documented.  Last Vitals:  Vitals:   12/12/22 1330 12/12/22 1430  BP: 116/67 114/63  Pulse: 67 72  Resp: 15 15  Temp:    SpO2: 98% 99%    Last Pain:  Vitals:   12/12/22 1430  TempSrc:   PainSc: 2                  Lewie Loron

## 2022-12-12 NOTE — Discharge Instructions (Signed)

## 2022-12-12 NOTE — Transfer of Care (Signed)
Immediate Anesthesia Transfer of Care Note  Patient: [name]  Procedure(s) Performed: TOTAL KNEE ARTHROPLASTY (Right: Knee)  Patient Location: PACU  Anesthesia Type:Regional  Level of Consciousness: awake, alert , and oriented  Airway & Oxygen Therapy: Patient Spontanous Breathing  Post-op Assessment: Report given to RN and Post -op Vital signs reviewed and stable  Post vital signs: Reviewed and stable  Last Vitals:  Vitals Value Taken Time  BP 106/61 12/12/22 0904  Temp    Pulse 83 12/12/22 0906  Resp 16 12/12/22 0906  SpO2 98 % 12/12/22 0906  Vitals shown include unfiled device data.  Last Pain:  Vitals:   12/12/22 0549  TempSrc: Oral         Complications: No notable events documented.

## 2022-12-12 NOTE — Progress Notes (Signed)
Physical Therapy Treatment Patient Details Name: [name] MRN: [id number] DOB: 1965-09-07 Today's Date: 12/12/2022   History of Present Illness 57 yo female presents to therapy s/p R TKA on 12/12/2022 due to failure of conservative measures. Pt PMH includes but is not limited to: HLD, TIA, CVA (2012 and 2018),  lumbar DDD, leiomyoma, and dysrhythmia with loop recorder in situ.    PT Comments    [name] is a 57 y.o. female POD 0 s/p R TKA. Patient reports IND with mobility at baseline. Patient is now limited by functional impairments (see PT problem list below). PT returned 1.5 hrs s/p last tx session to re-assess pt for d/c. Nurse communicated with Dr. Charlann Boxer per trial with R KI to improve R LE stability and safety for d/c. Pt required CGA and cues for transfers from recliner with R KI donned and noted R LE instability with weight shifting with pre-gait tasks.  Unable to safely progress with gait or step navigation instruction at time of eval due to R LE instability with R KI donned attributed to slow regression of anesthesia. PT noted with SLR pt exhibited > 10 degree lag and required AA for SLR and for SAQ which pt was able to complete at time of eval. Pt indicating she is starting to feel pain in R knee 5/10 and requested pain medication. Pt is aware of PT recommendation and pt and spouse in agreement, for hospital admission. Patient will benefit from continued skilled PT interventions to address impairments and progress towards PLOF. Acute PT will follow to progress mobility and stair training in preparation for safe discharge home with family support and OPPT services.    If plan is discharge home, recommend the following: A Lopp help with walking and/or transfers;A Tax help with bathing/dressing/bathroom;Assistance with cooking/housework;Assist for transportation;Help with stairs or ramp for entrance   Can travel by private vehicle        Equipment Recommendations  Rolling  walker (2 wheels)    Recommendations for Other Services       Precautions / Restrictions Precautions Precautions: Knee;Fall Restrictions Weight Bearing Restrictions: No     Mobility  Bed Mobility Overal bed mobility: Needs Assistance Bed Mobility: Supine to Sit     Supine to sit: Contact guard, HOB elevated     General bed mobility comments: pt seated in recliner when PT retuned    Transfers Overall transfer level: Needs assistance Equipment used: Rolling walker (2 wheels) Transfers: Sit to/from Stand Sit to Stand: Contact guard assist Stand pivot transfers: Mod assist         General transfer comment: R KI donned and pt required CGA for sit to stand from recliner, PT assessing pt R LE stabiltiy in weight shifting and even with R KI donned instabiltiy noted and unable to safely progress with gait trial from PT and pt perspective    Ambulation/Gait               General Gait Details: NT due to R LE instabilty with R KI donned with weight acceptance   Stairs             Wheelchair Mobility     Tilt Bed    Modified Rankin (Stroke Patients Only)       Balance  Cognition Arousal: Alert Behavior During Therapy: WFL for tasks assessed/performed Overall Cognitive Status: Within Functional Limits for tasks assessed                                          Exercises Total Joint Exercises Ankle Circles/Pumps: AROM Quad Sets: AROM, Right, 5 reps Short Arc Quad: Right, 5 reps, AAROM Heel Slides: AROM, Right, 5 reps Hip ABduction/ADduction: AROM, Right, 5 reps, Supine Straight Leg Raises: Right, 5 reps, AAROM    General Comments        Pertinent Vitals/Pain Pain Assessment Pain Assessment: 0-10 Pain Score: 5  Pain Location: R LE Pain Descriptors / Indicators: Dull, Aching, Operative site guarding, Constant, Discomfort Pain Intervention(s): Limited activity  within patient's tolerance, Monitored during session, Premedicated before session, Repositioned, Ice applied    Home Living                          Prior Function            PT Goals (current goals can now be found in the care plan section) Acute Rehab PT Goals Patient Stated Goal: to be able to squat down and get back up, walk without pain and keep up with the grandchildren PT Goal Formulation: With patient Time For Goal Achievement: 12/26/22 Progress towards PT goals: Not progressing toward goals - comment (due to ongoing R LE instability with KI donned)    Frequency    7X/week      PT Plan      Co-evaluation              AM-PAC PT "6 Clicks" Mobility   Outcome Measure  Help needed turning from your back to your side while in a flat bed without using bedrails?: None Help needed moving from lying on your back to sitting on the side of a flat bed without using bedrails?: A Magloire Help needed moving to and from a bed to a chair (including a wheelchair)?: A Kalisz Help needed standing up from a chair using your arms (e.g., wheelchair or bedside chair)?: A Nourse Help needed to walk in hospital room?: Total Help needed climbing 3-5 steps with a railing? : Total 6 Click Score: 15    End of Session Equipment Utilized During Treatment: Gait belt Activity Tolerance: Treatment limited secondary to medical complications (Comment) (R LE poor motor control and coordination and stability with weight acceptance) Patient left: in chair;with call bell/phone within reach;with family/visitor present Nurse Communication: Mobility status;Other (comment) (pt not currently appropriate for same day d/c) PT Visit Diagnosis: Unsteadiness on feet (R26.81);Other abnormalities of gait and mobility (R26.89);Muscle weakness (generalized) (M62.81);Pain;Difficulty in walking, not elsewhere classified (R26.2)     Time: 1610-9604 PT Time Calculation (min) (ACUTE ONLY): 13  min  Charges:    $Therapeutic Activity: 8-22 mins PT General Charges $$ ACUTE PT VISIT: 1 Visit                     Johnny Bridge, PT Acute Rehab   Jacqualyn Posey 12/12/2022, 4:55 PM

## 2022-12-13 ENCOUNTER — Encounter (HOSPITAL_COMMUNITY): Payer: Self-pay | Admitting: Orthopedic Surgery

## 2022-12-13 DIAGNOSIS — M1711 Unilateral primary osteoarthritis, right knee: Secondary | ICD-10-CM | POA: Diagnosis not present

## 2022-12-13 LAB — CBC
HCT: 32.5 % — ABNORMAL LOW (ref 36.0–46.0)
Hemoglobin: 10.2 g/dL — ABNORMAL LOW (ref 12.0–15.0)
MCH: 30.1 pg (ref 26.0–34.0)
MCHC: 31.4 g/dL (ref 30.0–36.0)
MCV: 95.9 fL (ref 80.0–100.0)
Platelets: 272 10*3/uL (ref 150–400)
RBC: 3.39 MIL/uL — ABNORMAL LOW (ref 3.87–5.11)
RDW: 12.8 % (ref 11.5–15.5)
WBC: 15.7 10*3/uL — ABNORMAL HIGH (ref 4.0–10.5)
nRBC: 0 % (ref 0.0–0.2)

## 2022-12-13 LAB — BASIC METABOLIC PANEL
Anion gap: 10 (ref 5–15)
BUN: 17 mg/dL (ref 6–20)
CO2: 22 mmol/L (ref 22–32)
Calcium: 8.3 mg/dL — ABNORMAL LOW (ref 8.9–10.3)
Chloride: 104 mmol/L (ref 98–111)
Creatinine, Ser: 0.39 mg/dL — ABNORMAL LOW (ref 0.44–1.00)
GFR, Estimated: >60 mL/min (ref >60)
Glucose, Bld: 140 mg/dL — ABNORMAL HIGH (ref 70–99)
Potassium: 3.8 mmol/L (ref 3.5–5.1)
Sodium: 136 mmol/L (ref 135–145)

## 2022-12-13 NOTE — Progress Notes (Signed)
Physical Therapy Treatment Patient Details Name: [name] MRN: [id number] DOB: [birth year] Today's Date: 12/13/2022   History of Present Illness 57 yo female presents to therapy s/p R TKA on 12/12/2022 due to failure of conservative measures. Pt PMH includes but is not limited to: HLD, TIA, CVA (2012 and 2018),  lumbar DDD, leiomyoma, and dysrhythmia with loop recorder in situ.    PT Comments  Pt making excellent progress, meeting goals. Ready for d/c from PT standpoint with family assist as needed     If plan is discharge home, recommend the following: A Cardozo help with walking and/or transfers;A Mccann help with bathing/dressing/bathroom;Assistance with cooking/housework;Assist for transportation;Help with stairs or ramp for entrance   Can travel by private vehicle        Equipment Recommendations  Rolling walker (2 wheels)    Recommendations for Other Services       Precautions / Restrictions Precautions Precautions: Knee;Fall Restrictions Weight Bearing Restrictions: No     Mobility  Bed Mobility Overal bed mobility: Modified Independent                  Transfers Overall transfer level: Needs assistance Equipment used: Rolling walker (2 wheels), Crutches Transfers: Sit to/from Stand Sit to Stand: Supervision           General transfer comment: cues for hand placement    Ambulation/Gait Ambulation/Gait assistance: Contact guard assist Gait Distance (Feet): 35 Feet Assistive device: Rolling walker (2 wheels), Crutches Gait Pattern/deviations: Step-to pattern, Step-through pattern       General Gait Details: cues for initial sequence, good stability with step through gait with no knee buckling; pt still reports some numbness anterior lower leg   Stairs Stairs: Yes Stairs assistance: Contact guard assist Stair Management: No rails, Step to pattern, Forwards, With walker, With crutches Number of Stairs: 5 (x2) General stair comments: cues for  sequence and technique; up/down with RW and second trial with crutches; husband present; pt with good stability with both devices. no LOB, no knee buckling   Wheelchair Mobility     Tilt Bed    Modified Rankin (Stroke Patients Only)       Balance                                            Cognition Arousal: Alert Behavior During Therapy: WFL for tasks assessed/performed Overall Cognitive Status: Within Functional Limits for tasks assessed                                          Exercises Total Joint Exercises Ankle Circles/Pumps: AROM, Both, 10 reps Quad Sets: AROM, Both, 10 reps Short Arc Quad: Right, 5 reps, AAROM Heel Slides: AROM, AAROM, Right, 10 reps    General Comments        Pertinent Vitals/Pain Pain Assessment Pain Assessment: 0-10 Pain Score: 5  Pain Location: R LE-mostly posterior Pain Descriptors / Indicators: Dull, Aching, Operative site guarding, Constant, Discomfort Pain Intervention(s): Limited activity within patient's tolerance, Monitored during session, Premedicated before session, Repositioned    Home Living                          Prior Function  PT Goals (current goals can now be found in the care plan section) Acute Rehab PT Goals Patient Stated Goal: to be able to squat down and get back up, walk without pain and keep up with the grandchildren PT Goal Formulation: With patient Time For Goal Achievement: 12/26/22 Progress towards PT goals: Progressing toward goals    Frequency    7X/week      PT Plan      Co-evaluation              AM-PAC PT "6 Clicks" Mobility   Outcome Measure  Help needed turning from your back to your side while in a flat bed without using bedrails?: None Help needed moving from lying on your back to sitting on the side of a flat bed without using bedrails?: None Help needed moving to and from a bed to a chair (including a wheelchair)?:  A Leavelle Help needed standing up from a chair using your arms (e.g., wheelchair or bedside chair)?: A Darco Help needed to walk in hospital room?: A Lumley Help needed climbing 3-5 steps with a railing? : A Wipperfurth 6 Click Score: 20    End of Session Equipment Utilized During Treatment: Gait belt Activity Tolerance: Patient tolerated treatment well Patient left: with call bell/phone within reach;in bed;with family/visitor present;with nursing/sitter in room Nurse Communication: Mobility status (c) PT Visit Diagnosis: Unsteadiness on feet (R26.81);Other abnormalities of gait and mobility (R26.89);Muscle weakness (generalized) (M62.81);Pain;Difficulty in walking, not elsewhere classified (R26.2) Pain - Right/Left: Right Pain - part of body: Knee     Time: 6440-3474 PT Time Calculation (min) (ACUTE ONLY): 16 min  Charges:    $Gait Training: 8-22 mins PT General Charges $$ ACUTE PT VISIT: 1 Visit                     Larue Drawdy, PT  Acute Rehab Dept ([facility]) [phone]  12/13/2022    [facility] 12/13/2022, 1:42 PM

## 2022-12-13 NOTE — TOC Transition Note (Signed)
Transition of Care [facility]) - CM/SW Discharge Note   Patient Details  Name: [name] MRN: [id number] Date of Birth: 1966-01-20  Transition of Care [facility]) CM/SW Contact:  Amada Jupiter, LCSW Phone Number: 12/13/2022, 9:44 AM   Clinical Narrative:     Met with pt who confirms she has received RW to room via [employer].  OPPT already arranged with [facility] ([place]).  No further TOC needs.  Final next level of care: OP Rehab Barriers to Discharge: No Barriers Identified   Patient Goals and CMS Choice      Discharge Placement                         Discharge Plan and Services Additional resources added to the After Visit Summary for                  DME Arranged: Walker rolling DME Agency: [employer]                  Social Determinants of Health (SDOH) Interventions SDOH Screenings   Food Insecurity: No Food Insecurity (12/12/2022)  Housing: Low Risk  (12/12/2022)  Transportation Needs: No Transportation Needs (12/12/2022)  Utilities: Not At Risk (12/12/2022)  Depression (PHQ2-9): Medium Risk (05/24/2020)  Financial Resource Strain: Low Risk  (06/13/2022)   Received from [facility], [facility]  Physical Activity: Insufficiently Active (06/10/2022)   Received from [facility], [facility]  Social Connections: Socially Integrated (06/10/2022)   Received from [facility], [facility]  Stress: Stress Concern Present (06/10/2022)   Received from [facility], [facility]  Tobacco Use: Low Risk  (12/12/2022)     Readmission Risk Interventions     No data to display

## 2022-12-13 NOTE — Progress Notes (Signed)
Physical Therapy Treatment Patient Details Name: [name] MRN: [id number] DOB: 09-30-1965 Today's Date: 12/13/2022   History of Present Illness 57 yo female presents to therapy s/p R TKA on 12/12/2022 due to failure of conservative measures. Pt PMH includes but is not limited to: HLD, TIA, CVA (2012 and 2018),  lumbar DDD, leiomyoma, and dysrhythmia with loop recorder in situ.    PT Comments  Pt progressing well thi =s am; will see again this pm to review stairs and pt should be ready to d/c later today from PT standpoint   If plan is discharge home, recommend the following: A Mui help with walking and/or transfers;A Laduke help with bathing/dressing/bathroom;Assistance with cooking/housework;Assist for transportation;Help with stairs or ramp for entrance   Can travel by private vehicle        Equipment Recommendations  Rolling walker (2 wheels)    Recommendations for Other Services       Precautions / Restrictions Precautions Precautions: Knee;Fall Restrictions Weight Bearing Restrictions: No     Mobility  Bed Mobility Overal bed mobility: Modified Independent                  Transfers Overall transfer level: Needs assistance Equipment used: Rolling walker (2 wheels) Transfers: Sit to/from Stand Sit to Stand: Supervision, Contact guard assist           General transfer comment: cues for hand placement    Ambulation/Gait Ambulation/Gait assistance: Contact guard assist Gait Distance (Feet): 120 Feet Assistive device: Rolling walker (2 wheels) Gait Pattern/deviations: Step-to pattern, Step-through pattern       General Gait Details: cues for initial sequence, beginning step through wit no knee buckling; much improved stability with regional block resolving. pt still report some numbness   Stairs             Wheelchair Mobility     Tilt Bed    Modified Rankin (Stroke Patients Only)       Balance                                             Cognition Arousal: Alert Behavior During Therapy: WFL for tasks assessed/performed Overall Cognitive Status: Within Functional Limits for tasks assessed                                          Exercises Total Joint Exercises Ankle Circles/Pumps: AROM, Both, 10 reps Quad Sets: AROM, Both, 10 reps Short Arc Quad: Right, 5 reps, AAROM Heel Slides: AROM, AAROM, Right, 10 reps    General Comments        Pertinent Vitals/Pain Pain Assessment Pain Assessment: 0-10 Pain Score: 5  Pain Location: R LE-mostly posterior Pain Descriptors / Indicators: Dull, Aching, Operative site guarding, Constant, Discomfort Pain Intervention(s): Limited activity within patient's tolerance, Monitored during session, Premedicated before session, Repositioned    Home Living                          Prior Function            PT Goals (current goals can now be found in the care plan section) Acute Rehab PT Goals Patient Stated Goal: to be able to squat down and get back up, walk without  pain and keep up with the grandchildren PT Goal Formulation: With patient Time For Goal Achievement: 12/26/22 Progress towards PT goals: Progressing toward goals    Frequency    7X/week      PT Plan      Co-evaluation              AM-PAC PT "6 Clicks" Mobility   Outcome Measure  Help needed turning from your back to your side while in a flat bed without using bedrails?: None Help needed moving from lying on your back to sitting on the side of a flat bed without using bedrails?: None Help needed moving to and from a bed to a chair (including a wheelchair)?: A Auletta Help needed standing up from a chair using your arms (e.g., wheelchair or bedside chair)?: A Schlechter Help needed to walk in hospital room?: A Lech Help needed climbing 3-5 steps with a railing? : A Renner 6 Click Score: 20    End of Session Equipment Utilized During  Treatment: Gait belt Activity Tolerance: Patient tolerated treatment well Patient left: in chair;with call bell/phone within reach;with chair alarm set Nurse Communication: Mobility status (c) PT Visit Diagnosis: Unsteadiness on feet (R26.81);Other abnormalities of gait and mobility (R26.89);Muscle weakness (generalized) (M62.81);Pain;Difficulty in walking, not elsewhere classified (R26.2) Pain - Right/Left: Right Pain - part of body: Knee     Time: 4259-5638 PT Time Calculation (min) (ACUTE ONLY): 22 min  Charges:    $Gait Training: 8-22 mins PT General Charges $$ ACUTE PT VISIT: 1 Visit                     Paublo Warshawsky, PT  Acute Rehab Dept [facility]) [phone]  12/13/2022    [facility] 12/13/2022, 10:19 AM

## 2022-12-13 NOTE — Progress Notes (Signed)
Subjective: 1 Day Post-Op Procedure(s) (LRB): TOTAL KNEE ARTHROPLASTY (Right) Patient reports pain as mild.   Patient seen in rounds with Dr. Charlann Boxer. Patient is well, and has had no acute complaints or problems. No acute events overnight. She was not able to d/c home due to residual spinal effects. Foley catheter removed. Patient ambulated a few feet with PT.  We will start therapy today.   Objective: Vital signs in last 24 hours: Temp:  [97.5 F (36.4 C)-97.9 F (36.6 C)] 97.8 F (36.6 C) (09/25 0519) Pulse Rate:  [57-87] 59 (09/25 0519) Resp:  [12-19] 18 (09/25 0519) BP: (98-123)/(54-76) 104/58 (09/25 0519) SpO2:  [89 %-100 %] 100 % (09/25 0519)  Intake/Output from previous day:  Intake/Output Summary (Last 24 hours) at 12/13/2022 0822 Last data filed at 12/13/2022 0600 Gross per 24 hour  Intake 1972.73 ml  Output 1450 ml  Net 522.73 ml     Intake/Output this shift: No intake/output data recorded.  Labs: Recent Labs    12/13/22 0307  HGB 10.2*   Recent Labs    12/13/22 0307  WBC 15.7*  RBC 3.39*  HCT 32.5*  PLT 272   Recent Labs    12/13/22 0307  NA 136  K 3.8  CL 104  CO2 22  BUN 17  CREATININE 0.39*  GLUCOSE 140*  CALCIUM 8.3*   No results for input(s): "LABPT", "INR" in the last 72 hours.  Exam: General - Patient is Alert and Oriented Extremity - Neurologically intact Sensation intact distally Intact pulses distally Dorsiflexion/Plantar flexion intact Dressing - dressing C/D/I Motor Function - intact, moving foot and toes well on exam.   Past Medical History:  Diagnosis Date   Anxiety    DDD (degenerative disc disease), lumbar 01/19/2016   Dysrhythmia    PACs, palpitations (Has Loop recorder)   Exposure to phentermine    uses currently   Family history of adverse reaction to anesthesia    son has N&V   History of TIA (transient ischemic attack) 01/19/2016   Hypothyroidism    Osteoarthritis of both feet 01/19/2016   Osteoarthritis  of both knees 01/19/2016   PONV (postoperative nausea and vomiting)    Stroke (HCC) 07/2010   "TIA" per patient   Stroke (HCC) 2018   TIA   Vitamin D deficiency disease 11/28/2018    Assessment/Plan: 1 Day Post-Op Procedure(s) (LRB): TOTAL KNEE ARTHROPLASTY (Right) Principal Problem:   S/P total knee arthroplasty, right  Estimated body mass index is 30.36 kg/m as calculated from the following:   Height as of this encounter: 5\' 2"  (1.575 m).   Weight as of this encounter: 75.3 kg. Advance diet Up with therapy D/C IV fluids   Patient's anticipated LOS is less than 2 midnights, meeting these requirements: - Younger than 53 - Lives within 1 hour of care - Has a competent adult at home to recover with post-op recover - NO history of  - Chronic pain requiring opiods  - Diabetes  - Coronary Artery Disease  - Heart failure  - Heart attack  - Stroke  - DVT/VTE  - Cardiac arrhythmia  - Respiratory Failure/COPD  - Renal failure  - Anemia  - Advanced Liver disease     DVT Prophylaxis - Aspirin Weight bearing as tolerated.  Hgb stable at 10.2 this AM.  She has chronic bradycardic episodes, but has done well here so far.   Plan is to go Home after hospital stay. Plan for discharge today following 1-2 sessions of  PT as long as they are meeting their goals. Patient is scheduled for OPPT. Follow up in the office in 2 weeks.   Rosalene Billings, PA-C Orthopedic Surgery [phone] 12/13/2022, 8:22 AM

## 2022-12-14 NOTE — Progress Notes (Signed)
Carelink Summary Report / Loop Recorder 

## 2022-12-19 NOTE — Discharge Summary (Signed)
Patient ID: DANNON DEMSKY MRN: [id number] DOB/AGE: October 29, 1965 57 y.o.  Admit date: 12/12/2022 Discharge date: 12/13/2022  Admission Diagnoses:  Right knee osteoarthritis  Discharge Diagnoses:  Principal Problem:   S/P total knee arthroplasty, right   Past Medical History:  Diagnosis Date   Anxiety    DDD (degenerative disc disease), lumbar 01/19/2016   Dysrhythmia    PACs, palpitations (Has Loop recorder)   Exposure to phentermine    uses currently   Family history of adverse reaction to anesthesia    son has N&V   History of TIA (transient ischemic attack) 01/19/2016   Hypothyroidism    Osteoarthritis of both feet 01/19/2016   Osteoarthritis of both knees 01/19/2016   PONV (postoperative nausea and vomiting)    Stroke (HCC) 07/2010   "TIA" per patient   Stroke (HCC) 2018   TIA   Vitamin D deficiency disease 11/28/2018    Surgeries: Procedure(s): TOTAL KNEE ARTHROPLASTY on 12/12/2022   Consultants:   Discharged Condition: Improved  Hospital Course: Dariah Chiaverini Difiore is an 57 y.o. female who was admitted 12/12/2022 for operative treatment ofS/P total knee arthroplasty, right. Patient has severe unremitting pain that affects sleep, daily activities, and work/hobbies. After pre-op clearance the patient was taken to the operating room on 12/12/2022 and underwent  Procedure(s): TOTAL KNEE ARTHROPLASTY.    Patient was given perioperative antibiotics:  Anti-infectives (From admission, onward)    Start     Dose/Rate Route Frequency Ordered Stop   12/12/22 1449  ceFAZolin (ANCEF) 2-4 GM/100ML-% IVPB       Note to Pharmacy: Roel Cluck A: cabinet override      12/12/22 1449 12/13/22 0259   12/12/22 1400  ceFAZolin (ANCEF) IVPB 2g/100 mL premix        2 g 200 mL/hr over 30 Minutes Intravenous Every 6 hours 12/12/22 0918 12/12/22 2046   12/12/22 0930  ceFAZolin (ANCEF) IVPB 2g/100 mL premix  Status:  Discontinued        2 g 200 mL/hr over 30 Minutes Intravenous Every 6  hours 12/12/22 0917 12/12/22 0918   12/12/22 0600  ceFAZolin (ANCEF) IVPB 2g/100 mL premix        2 g 200 mL/hr over 30 Minutes Intravenous On call to O.R. 12/12/22 9562 12/12/22 0741        Patient was given sequential compression devices, early ambulation, and chemoprophylaxis to prevent DVT. Patient worked with PT and was meeting their goals regarding safe ambulation and transfers.  Patient benefited maximally from hospital stay and there were no complications.    Recent vital signs: No data found.   Recent laboratory studies: No results for input(s): "WBC", "HGB", "HCT", "PLT", "NA", "K", "CL", "CO2", "BUN", "CREATININE", "GLUCOSE", "INR", "CALCIUM" in the last 72 hours.  Invalid input(s): "PT", "2"   Discharge Medications:   Allergies as of 12/13/2022       Reactions   Celebrex [celecoxib] Swelling   Facial swelling        Medication List     TAKE these medications    baclofen 10 MG tablet Commonly known as: LIORESAL TAKE 1 TABLET(10 MG) BY MOUTH DAILY AS NEEDED FOR MUSCLE SPASMS   clonazePAM 0.5 MG tablet Commonly known as: KLONOPIN Take 0.5 mg by mouth at bedtime.   escitalopram 10 MG tablet Commonly known as: LEXAPRO TAKE 1 TABLET(10 MG) BY MOUTH DAILY   meloxicam 15 MG tablet Commonly known as: MOBIC Take 15 mg by mouth daily.   methocarbamol 500 MG tablet Commonly  known as: ROBAXIN Take 1 tablet (500 mg total) by mouth every 6 (six) hours as needed for muscle spasms. What changed:  how much to take when to take this   oxyCODONE 5 MG immediate release tablet Commonly known as: Oxy IR/ROXICODONE Take 1-2 tablets (5-10 mg total) by mouth every 4 (four) hours as needed for severe pain.   phentermine 37.5 MG tablet Commonly known as: ADIPEX-P Take 0.5 tablets by mouth daily.   pravastatin 10 MG tablet Commonly known as: PRAVACHOL TAKE 1 TABLET BY MOUTH EVERY EVENING   TART CHERRY ADVANCED PO Take 1 capsule by mouth daily.   TART CHERRY  PO Take 1 capsule by mouth daily.   thyroid 60 MG tablet Commonly known as: ARMOUR Take 60 mg by mouth daily before breakfast.   TURMERIC PO Take 1 capsule by mouth daily.   Vitamin D-3 125 MCG (5000 UT) Tabs Take 5,000 Units by mouth daily at 12 noon.               Discharge Care Instructions  (From admission, onward)           Start     Ordered   12/13/22 0000  Change dressing       Comments: Maintain surgical dressing until follow up in the clinic. If the edges start to pull up, may reinforce with tape. If the dressing is no longer working, may remove and cover with gauze and tape, but must keep the area dry and clean.  Call with any questions or concerns.   12/13/22 0824            Diagnostic Studies: CUP PACEART REMOTE DEVICE CHECK  Result Date: 11/21/2022 ILR summary report received. Battery status OK. Normal device function. No new symptom, tachy, brady, or pause episodes. No new AF episodes.  AF burden is 0% of the time.   Monthly summary reports and ROV/PRN ML, CVRS   Disposition: Discharge disposition: 01-Home or Self Care       Discharge Instructions     Call MD / Call 911   Complete by: As directed    If you experience chest pain or shortness of breath, CALL 911 and be transported to the hospital emergency room.  If you develope a fever above 101 F, pus (white drainage) or increased drainage or redness at the wound, or calf pain, call your surgeon's office.   Change dressing   Complete by: As directed    Maintain surgical dressing until follow up in the clinic. If the edges start to pull up, may reinforce with tape. If the dressing is no longer working, may remove and cover with gauze and tape, but must keep the area dry and clean.  Call with any questions or concerns.   Constipation Prevention   Complete by: As directed    Drink plenty of fluids.  Prune juice may be helpful.  You may use a stool softener, such as Colace (over the counter) 100 mg  twice a day.  Use MiraLax (over the counter) for constipation as needed.   Diet - low sodium heart healthy   Complete by: As directed    Increase activity slowly as tolerated   Complete by: As directed    Weight bearing as tolerated with assist device (walker, cane, etc) as directed, use it as long as suggested by your surgeon or therapist, typically at least 4-6 weeks.   Post-operative opioid taper instructions:   Complete by: As directed  POST-OPERATIVE OPIOID TAPER INSTRUCTIONS: It is important to wean off of your opioid medication as soon as possible. If you do not need pain medication after your surgery it is ok to stop day one. Opioids include: Codeine, Hydrocodone(Norco, Vicodin), Oxycodone(Percocet, oxycontin) and hydromorphone amongst others.  Long term and even short term use of opiods can cause: Increased pain response Dependence Constipation Depression Respiratory depression And more.  Withdrawal symptoms can include Flu like symptoms Nausea, vomiting And more Techniques to manage these symptoms Hydrate well Eat regular healthy meals Stay active Use relaxation techniques(deep breathing, meditating, yoga) Do Not substitute Alcohol to help with tapering If you have been on opioids for less than two weeks and do not have pain than it is ok to stop all together.  Plan to wean off of opioids This plan should start within one week post op of your joint replacement. Maintain the same interval or time between taking each dose and first decrease the dose.  Cut the total daily intake of opioids by one tablet each day Next start to increase the time between doses. The last dose that should be eliminated is the evening dose.      TED hose   Complete by: As directed    Use stockings (TED hose) for 2 weeks on both leg(s).  You may remove them at night for sleeping.        Follow-up Information     Durene Romans, MD. Schedule an appointment as soon as possible for a  visit in 2 week(s).   Specialty: Orthopedic Surgery Contact information: [address] [place] [address] [phone]                  Signed: Cassandria Anger 12/19/2022, 7:20 AM

## 2022-12-21 ENCOUNTER — Ambulatory Visit: Payer: 59 | Attending: Rheumatology | Admitting: Physician Assistant

## 2022-12-21 ENCOUNTER — Encounter: Payer: Self-pay | Admitting: Physician Assistant

## 2022-12-21 VITALS — BP 134/87 | HR 66 | Resp 15 | Ht 62.0 in | Wt 175.4 lb

## 2022-12-21 DIAGNOSIS — M533 Sacrococcygeal disorders, not elsewhere classified: Secondary | ICD-10-CM

## 2022-12-21 DIAGNOSIS — Z96651 Presence of right artificial knee joint: Secondary | ICD-10-CM | POA: Diagnosis not present

## 2022-12-21 DIAGNOSIS — G8929 Other chronic pain: Secondary | ICD-10-CM

## 2022-12-21 DIAGNOSIS — M5136 Other intervertebral disc degeneration, lumbar region: Secondary | ICD-10-CM

## 2022-12-21 DIAGNOSIS — M1712 Unilateral primary osteoarthritis, left knee: Secondary | ICD-10-CM | POA: Diagnosis not present

## 2022-12-21 DIAGNOSIS — M19072 Primary osteoarthritis, left ankle and foot: Secondary | ICD-10-CM

## 2022-12-21 DIAGNOSIS — M19042 Primary osteoarthritis, left hand: Secondary | ICD-10-CM

## 2022-12-21 DIAGNOSIS — M170 Bilateral primary osteoarthritis of knee: Secondary | ICD-10-CM

## 2022-12-21 DIAGNOSIS — M19071 Primary osteoarthritis, right ankle and foot: Secondary | ICD-10-CM

## 2022-12-21 DIAGNOSIS — M19041 Primary osteoarthritis, right hand: Secondary | ICD-10-CM

## 2022-12-21 DIAGNOSIS — M51369 Other intervertebral disc degeneration, lumbar region without mention of lumbar back pain or lower extremity pain: Secondary | ICD-10-CM

## 2022-12-21 DIAGNOSIS — H15123 Nodular episcleritis, bilateral: Secondary | ICD-10-CM | POA: Diagnosis not present

## 2022-12-21 DIAGNOSIS — Z9049 Acquired absence of other specified parts of digestive tract: Secondary | ICD-10-CM

## 2022-12-21 DIAGNOSIS — Z8673 Personal history of transient ischemic attack (TIA), and cerebral infarction without residual deficits: Secondary | ICD-10-CM

## 2022-12-21 DIAGNOSIS — Z9181 History of falling: Secondary | ICD-10-CM

## 2023-01-01 ENCOUNTER — Ambulatory Visit (INDEPENDENT_AMBULATORY_CARE_PROVIDER_SITE_OTHER): Payer: 59

## 2023-01-01 DIAGNOSIS — R002 Palpitations: Secondary | ICD-10-CM

## 2023-01-02 LAB — CUP PACEART REMOTE DEVICE CHECK
Date Time Interrogation Session: 20241013231012
Implantable Pulse Generator Implant Date: 20210827

## 2023-01-15 ENCOUNTER — Ambulatory Visit: Payer: 59

## 2023-01-16 NOTE — Progress Notes (Signed)
Carelink Summary Report / Loop Recorder 

## 2023-02-05 ENCOUNTER — Ambulatory Visit (INDEPENDENT_AMBULATORY_CARE_PROVIDER_SITE_OTHER): Payer: 59

## 2023-02-05 DIAGNOSIS — R001 Bradycardia, unspecified: Secondary | ICD-10-CM

## 2023-02-05 LAB — CUP PACEART REMOTE DEVICE CHECK
Date Time Interrogation Session: 20241117230625
Implantable Pulse Generator Implant Date: 20210827

## 2023-02-19 ENCOUNTER — Ambulatory Visit: Payer: 59

## 2023-03-01 NOTE — Progress Notes (Signed)
Carelink Summary Report / Loop Recorder 

## 2023-03-12 ENCOUNTER — Ambulatory Visit: Payer: 59

## 2023-03-12 DIAGNOSIS — R001 Bradycardia, unspecified: Secondary | ICD-10-CM | POA: Diagnosis not present

## 2023-03-12 LAB — CUP PACEART REMOTE DEVICE CHECK
Date Time Interrogation Session: 20241222230323
Implantable Pulse Generator Implant Date: 20210827

## 2023-03-21 HISTORY — PX: ABLATION: SHX5711

## 2023-03-26 ENCOUNTER — Ambulatory Visit: Payer: 59

## 2023-04-16 ENCOUNTER — Ambulatory Visit (INDEPENDENT_AMBULATORY_CARE_PROVIDER_SITE_OTHER): Payer: 59

## 2023-04-16 DIAGNOSIS — R002 Palpitations: Secondary | ICD-10-CM

## 2023-04-16 LAB — CUP PACEART REMOTE DEVICE CHECK
Date Time Interrogation Session: 20250126230502
Implantable Pulse Generator Implant Date: 20210827

## 2023-04-19 NOTE — Progress Notes (Signed)
[facility] Summary Report / Loop Recorder

## 2023-04-20 ENCOUNTER — Encounter: Payer: Self-pay | Admitting: Cardiovascular Disease

## 2023-04-24 ENCOUNTER — Encounter (INDEPENDENT_AMBULATORY_CARE_PROVIDER_SITE_OTHER): Payer: Self-pay | Admitting: *Deleted

## 2023-04-30 ENCOUNTER — Ambulatory Visit: Payer: 59

## 2023-05-21 ENCOUNTER — Ambulatory Visit (INDEPENDENT_AMBULATORY_CARE_PROVIDER_SITE_OTHER): Payer: 59

## 2023-05-21 DIAGNOSIS — R002 Palpitations: Secondary | ICD-10-CM

## 2023-05-22 LAB — CUP PACEART REMOTE DEVICE CHECK
Date Time Interrogation Session: 20250302230135
Implantable Pulse Generator Implant Date: 20210827

## 2023-05-23 ENCOUNTER — Encounter: Payer: Self-pay | Admitting: Cardiovascular Disease

## 2023-05-25 NOTE — Progress Notes (Signed)
 [facility] Summary Report / Loop Recorder

## 2023-06-04 ENCOUNTER — Ambulatory Visit: Payer: 59

## 2023-06-07 NOTE — Progress Notes (Unsigned)
 Office Visit Note  Patient: [name]             Date of Birth: [birth year]           MRN: [id number]             PCP: Roe Rutherford, NP Referring: Roe Rutherford, NP Visit Date: 06/21/2023 Occupation: @GUAROCC @  Subjective:  Pain in multiple joints   History of Present Illness: [name] is a 58 y.o. female with history of nodular scleritis and osteoarthritis.   Patient denies any signs or symptoms of a scleritis flare.  She has not had any eye pain, photophobia, or eye redness.  She is due for an ophthalmology office visit soon.  Patient continues to have chronic pain involving multiple joints.  Patient states that overall her right knee replacement is doing well.  She denies any increased discomfort in the left knee joint.  She has noticed some increased popping in the left hip with ambulation but denies any pain in the groin.  She continues to have chronic pain in her lower back.  She is under the care of Dr. Wilmon Pali.  She had bilateral SI joint cortisone injections performed in December 2024 which have been helpful at alleviating her discomfort.  Patient continues to have chronic pain and stiffness involving both hands.  She has been performing hand exercises as well as using arthritis compression gloves.  She also has been taking tart cherry and turmeric for the anti-inflammatory properties.  She is also been taking meloxicam 15 mg 1 tablet by mouth daily.  Patient states that she has stiffness first thing in the mornings as well as has had swelling every morning sometimes lasting the whole day.  Activities of Daily Living:  Patient reports morning stiffness for 10 minutes.   Patient Denies nocturnal pain.  Difficulty dressing/grooming: Denies Difficulty climbing stairs: Denies Difficulty getting out of chair: Denies Difficulty using hands for taps, buttons, cutlery, and/or writing: Reports  Review of Systems  Constitutional:  Positive for fatigue.  HENT:  Positive  for mouth dryness. Negative for mouth sores and nose dryness.   Eyes:  Positive for dryness. Negative for pain.  Respiratory:  Positive for shortness of breath. Negative for difficulty breathing.   Cardiovascular:  Positive for palpitations. Negative for chest pain.  Gastrointestinal:  Positive for constipation. Negative for blood in stool and diarrhea.  Endocrine: Negative for increased urination.  Genitourinary:  Negative for involuntary urination.  Musculoskeletal:  Positive for joint pain, joint pain, joint swelling, myalgias, muscle weakness, morning stiffness, muscle tenderness and myalgias. Negative for gait problem.  Skin:  Negative for color change, rash, hair loss and sensitivity to sunlight.  Allergic/Immunologic: Negative for susceptible to infections.  Neurological:  Negative for dizziness and headaches.  Hematological:  Negative for swollen glands.  Psychiatric/Behavioral:  Negative for depressed mood and sleep disturbance. The patient is not nervous/anxious.     PMFS History:  Patient Active Problem List   Diagnosis Date Noted   S/P total knee arthroplasty, right 12/12/2022   Hyperlipemia 05/12/2021   Chest pain of uncertain etiology 05/12/2021   Palpitation 04/22/2019   Fatigue 04/22/2019   Menopause 04/22/2019   Vitamin D deficiency disease 11/28/2018   Abdominal pain 03/21/2018   Nausea with vomiting 03/21/2018   Primary osteoarthritis of both knees 09/24/2017   Osteoarthritis of both knees 01/19/2016   Osteoarthritis of both feet 01/19/2016   History of TIA (transient ischemic attack) 01/19/2016   DDD (degenerative disc  disease), lumbar 01/19/2016   Nodular episcleritis of both eyes 01/19/2016   Pain in right knee 01/19/2016   Leiomyoma 10/12/2011   Female pelvic pain 10/12/2011    Past Medical History:  Diagnosis Date   Anxiety    DDD (degenerative disc disease), lumbar 01/19/2016   Dysrhythmia    PACs, palpitations (Has Loop recorder)   Exposure to  phentermine    uses currently   Family history of adverse reaction to anesthesia    son has N&V   History of TIA (transient ischemic attack) 01/19/2016   Hypothyroidism    Osteoarthritis of both feet 01/19/2016   Osteoarthritis of both knees 01/19/2016   PONV (postoperative nausea and vomiting)    Stroke (HCC) 07/2010   "TIA" per patient   Stroke (HCC) 2018   TIA   Vitamin D deficiency disease 11/28/2018    Family History  Problem Relation Age of Onset   Anuerysm Mother    Heart attack Mother    Heart disease Mother    Diabetes Father    Hypertension Father    Macular degeneration Father    Diabetes Sister    Osteoarthritis Sister    Fibromyalgia Sister    Glaucoma Sister    Colon cancer Paternal Uncle    Past Surgical History:  Procedure Laterality Date   BIOPSY  05/15/2018   Procedure: BIOPSY;  Surgeon: Corbin Ade, MD;  Location: [facility];  Service: Endoscopy;;  polyp cb   CHOLECYSTECTOMY     COLONOSCOPY N/A 05/15/2018   Procedure: COLONOSCOPY;  Surgeon: Corbin Ade, MD;  Location: [facility];  Service: Endoscopy;  Laterality: N/A;  12:00pm   EYE SURGERY Bilateral    Lasik   implantable loop recorder placement  11/15/2019   Medtronic Reveal Linq model LNQ 22 ([id number] [id number])  implantable loop recorder by Dr Johney Frame   KNEE ARTHROSCOPY Right    x2   LAPAROSCOPIC ASSISTED VAGINAL HYSTERECTOMY  10/11/2011   Procedure: LAPAROSCOPIC ASSISTED VAGINAL HYSTERECTOMY;  Surgeon: Meriel Pica, MD;  Location: [facility];  Service: Gynecology;  Laterality: N/A;   POLYPECTOMY  05/15/2018   Procedure: POLYPECTOMY;  Surgeon: Corbin Ade, MD;  Location: [facility];  Service: Endoscopy;;  colon    TOTAL KNEE ARTHROPLASTY Right 12/12/2022   Procedure: TOTAL KNEE ARTHROPLASTY;  Surgeon: Durene Romans, MD;  Location: [facility];  Service: Orthopedics;  Laterality: Right;   Social History   Social History Narrative   Lives with husband of 2 years(second)  and  son/family.Works in Psychologist, forensic for a long time. Has 88 son 71 years old. Divorced x 1(previously married for 25 yrs), remarried <1. Has 2 dogs.   Immunization History  Administered Date(s) Administered   Influenza Inj Mdck Quad Pf 01/26/2020   Influenza,inj,Quad PF,6+ Mos 02/27/2019   Influenza-Unspecified 02/27/2019   Tdap 08/24/2017     Objective: Vital Signs: BP 135/79 (BP Location: Left Arm, Patient Position: Sitting, Cuff Size: Large)   Pulse 72   Resp 16   Ht 5\' 2"  (1.575 m)   Wt 171 lb (77.6 kg)   LMP 01/09/2011   BMI 31.28 kg/m    Physical Exam Vitals and nursing note reviewed.  Constitutional:      Appearance: She is well-developed.  HENT:     Head: Normocephalic and atraumatic.  Eyes:     Conjunctiva/sclera: Conjunctivae normal.  Cardiovascular:     Rate and Rhythm: Normal rate and regular rhythm.     Heart sounds: Normal  heart sounds.  Pulmonary:     Effort: Pulmonary effort is normal.     Breath sounds: Normal breath sounds.  Abdominal:     General: Bowel sounds are normal.     Palpations: Abdomen is soft.  Musculoskeletal:     Cervical back: Normal range of motion.  Lymphadenopathy:     Cervical: No cervical adenopathy.  Skin:    General: Skin is warm and dry.     Capillary Refill: Capillary refill takes less than 2 seconds.  Neurological:     Mental Status: She is alert and oriented to person, place, and time.  Psychiatric:        Behavior: Behavior normal.      Musculoskeletal Exam: C-spine has good range of motion.  Shoulder joints, elbow joints, wrist joints, MCPs, PIPs, DIPs have good range of motion with no synovitis.  Complete fist formation bilaterally.  DIP prominence noted bilaterally.  Tenderness over all PIP joints.  Complete fist formation bilaterally.  Hip joints have good range of motion with no groin pain.  Right knee replacement has good range of motion with no effusion.  Left knee joint has good range of motion no warmth or  effusion.  Ankle joints have good range of motion with no tenderness or joint swelling.  CDAI Exam: CDAI Score: -- Patient Global: --; Provider Global: -- Swollen: --; Tender: -- Joint Exam 06/21/2023   No joint exam has been documented for this visit   There is currently no information documented on the homunculus. Go to the Rheumatology activity and complete the homunculus joint exam.  Investigation: No additional findings.  Imaging: No results found.   Recent Labs: Lab Results  Component Value Date   WBC 15.7 (H) 12/13/2022   HGB 10.2 (L) 12/13/2022   PLT 272 12/13/2022   NA 136 12/13/2022   K 3.8 12/13/2022   CL 104 12/13/2022   CO2 22 12/13/2022   GLUCOSE 140 (H) 12/13/2022   BUN 17 12/13/2022   CREATININE 0.39 (L) 12/13/2022   BILITOT 1.0 01/26/2020   ALKPHOS 50 07/13/2016   AST 11 01/26/2020   ALT 10 01/26/2020   PROT 7.0 01/26/2020   ALBUMIN 3.8 07/13/2016   CALCIUM 8.3 (L) 12/13/2022   GFRAA 124 01/26/2020    Speciality Comments: No specialty comments available.  Procedures:  No procedures performed Allergies: Celebrex [celecoxib]   Assessment / Plan:     Visit Diagnoses: Nodular episcleritis of both eyes: No signs or symptoms of a scleritis flare.  She has not had any eye pain, redness, or photophobia.  No conjunctival injection was noted.  Patient plans on following up with ophthalmology for routine follow-up visit soon.  Primary osteoarthritis of both hands - 05/12/20: CRP WNL, ESR WNL, RF-, anti-CCP-, 14-3-3 eta negative. X-rays updated 04/2320 consistent with early osteoarthritic changes.  Patient continues to have persistent pain and stiffness involving both hands.  Patient states that the pain, swelling, and stiffness has been most severe first thing in the morning.  She has been taking meloxicam 15 mg daily but continues to have persistent pain and swelling.  On examination today no synovitis of the MCP joints noted.  She has tenderness along the PIP  joints of both hands.  Mild DIP prominence noted, most prominent in the right hand.  Plan to schedule ultrasound of both hands to assess for synovitis.  Primary osteoarthritis of left knee: Warmth or effusion noted.  S/P total knee arthroplasty, right - Performed by Dr. Charlann Boxer on  12/12/22.  Completed physical therapy.  Doing well.  She has good range of motion of the right knee with no warmth or effusion.  Primary osteoarthritis of both feet: She is not experiencing any increased discomfort in her feet at this time.  She is good range of motion of both ankle joints with no tenderness or joint swelling.  Degeneration of intervertebral disc of lumbar region without discogenic back pain or lower extremity pain - Mild levoscoliosis and disc disease on the lumbar spine x-rays obtained on May 14, 2019.  Facet joint arthropathy.  Her lower back pain has been more manageable.  She remains under the care of Dr. Wilmon Pali.  She is taking methocarbamol and meloxicam as prescribed.  Chronic SI joint pain: To the care of Dr. Wilmon Pali.  Patient had bilateral SI joint cortisone injections performed in December 2024 by Dr. Wilmon Pali.  Her symptoms have been more manageable.  She has been taking meloxicam 15 mg daily and methocarbamol as needed for muscle spasms.  Other medical conditions are listed as follows:  History of TIA (transient ischemic attack)  History of cholecystectomy  Primary osteoarthritis of both knees  History of recent fall  Orders: No orders of the defined types were placed in this encounter.  No orders of the defined types were placed in this encounter.     Follow-Up Instructions: Return in about 6 months (around 12/21/2023).   Gearldine Bienenstock, PA-C  Note - This record has been created using Dragon software.  Chart creation errors have been sought, but may not always  have been located. Such creation errors do not reflect on  the standard of medical care.

## 2023-06-21 ENCOUNTER — Encounter: Payer: Self-pay | Admitting: Physician Assistant

## 2023-06-21 ENCOUNTER — Ambulatory Visit: Payer: 59 | Admitting: Rheumatology

## 2023-06-21 ENCOUNTER — Ambulatory Visit: Payer: 59 | Attending: Physician Assistant | Admitting: Physician Assistant

## 2023-06-21 VITALS — BP 135/79 | HR 72 | Resp 16 | Ht 62.0 in | Wt 171.0 lb

## 2023-06-21 DIAGNOSIS — H15123 Nodular episcleritis, bilateral: Secondary | ICD-10-CM

## 2023-06-21 DIAGNOSIS — Z8673 Personal history of transient ischemic attack (TIA), and cerebral infarction without residual deficits: Secondary | ICD-10-CM

## 2023-06-21 DIAGNOSIS — M19071 Primary osteoarthritis, right ankle and foot: Secondary | ICD-10-CM

## 2023-06-21 DIAGNOSIS — M19042 Primary osteoarthritis, left hand: Secondary | ICD-10-CM

## 2023-06-21 DIAGNOSIS — M51369 Other intervertebral disc degeneration, lumbar region without mention of lumbar back pain or lower extremity pain: Secondary | ICD-10-CM

## 2023-06-21 DIAGNOSIS — Z96651 Presence of right artificial knee joint: Secondary | ICD-10-CM | POA: Diagnosis not present

## 2023-06-21 DIAGNOSIS — M1712 Unilateral primary osteoarthritis, left knee: Secondary | ICD-10-CM

## 2023-06-21 DIAGNOSIS — G8929 Other chronic pain: Secondary | ICD-10-CM

## 2023-06-21 DIAGNOSIS — Z9049 Acquired absence of other specified parts of digestive tract: Secondary | ICD-10-CM

## 2023-06-21 DIAGNOSIS — M19072 Primary osteoarthritis, left ankle and foot: Secondary | ICD-10-CM

## 2023-06-21 DIAGNOSIS — M170 Bilateral primary osteoarthritis of knee: Secondary | ICD-10-CM

## 2023-06-21 DIAGNOSIS — M19041 Primary osteoarthritis, right hand: Secondary | ICD-10-CM

## 2023-06-21 DIAGNOSIS — M533 Sacrococcygeal disorders, not elsewhere classified: Secondary | ICD-10-CM

## 2023-06-21 DIAGNOSIS — Z9181 History of falling: Secondary | ICD-10-CM

## 2023-06-21 NOTE — Addendum Note (Signed)
 Addended by: Geralyn Flash D on: 06/21/2023 03:40 PM   Modules accepted: Orders

## 2023-06-21 NOTE — Progress Notes (Signed)
 [facility] Summary Report / Loop Recorder

## 2023-06-25 ENCOUNTER — Ambulatory Visit (INDEPENDENT_AMBULATORY_CARE_PROVIDER_SITE_OTHER): Payer: 59

## 2023-06-25 DIAGNOSIS — R002 Palpitations: Secondary | ICD-10-CM

## 2023-06-26 LAB — CUP PACEART REMOTE DEVICE CHECK
Date Time Interrogation Session: 20250406230303
Implantable Pulse Generator Implant Date: 20210827

## 2023-07-01 ENCOUNTER — Encounter: Payer: Self-pay | Admitting: Cardiovascular Disease

## 2023-07-03 ENCOUNTER — Ambulatory Visit: Attending: Rheumatology | Admitting: Rheumatology

## 2023-07-03 ENCOUNTER — Ambulatory Visit (INDEPENDENT_AMBULATORY_CARE_PROVIDER_SITE_OTHER)

## 2023-07-03 DIAGNOSIS — M79642 Pain in left hand: Secondary | ICD-10-CM

## 2023-07-03 DIAGNOSIS — M79641 Pain in right hand: Secondary | ICD-10-CM

## 2023-07-03 DIAGNOSIS — H15123 Nodular episcleritis, bilateral: Secondary | ICD-10-CM

## 2023-07-03 NOTE — Progress Notes (Signed)
 Visit diagnosis: Pain in both hands  Patient was here to get ultrasound examination of bilateral hands to evaluate for synovitis or tenosynovitis due to ongoing discomfort.  Ultrasound examination of bilateral hands was performed per EULAR recommendations. Using 15 MHz transducer, grayscale and power Doppler bilateral second and third MCP joints  both dorsal and volar aspects were evaluated to look for synovitis or tenosynovitis.   Impression: No synovitis or tenosynovitis was noted on the limited ultrasound examination of the hands.  Ultrasound findings were discussed with the patient.  Patient voiced understanding.  Nicholas Bari, MD

## 2023-07-09 ENCOUNTER — Ambulatory Visit: Payer: 59

## 2023-07-30 ENCOUNTER — Ambulatory Visit (INDEPENDENT_AMBULATORY_CARE_PROVIDER_SITE_OTHER): Payer: 59

## 2023-07-30 DIAGNOSIS — R002 Palpitations: Secondary | ICD-10-CM

## 2023-07-30 LAB — CUP PACEART REMOTE DEVICE CHECK
Date Time Interrogation Session: 20250511233200
Implantable Pulse Generator Implant Date: 20210827

## 2023-08-03 ENCOUNTER — Ambulatory Visit: Payer: Self-pay | Admitting: Cardiovascular Disease

## 2023-08-08 NOTE — Addendum Note (Signed)
 Addended by: Edra Govern D on: 08/08/2023 05:17 PM   Modules accepted: Orders

## 2023-08-08 NOTE — Progress Notes (Signed)
 [facility] Summary Report / Loop Recorder

## 2023-08-30 ENCOUNTER — Ambulatory Visit (INDEPENDENT_AMBULATORY_CARE_PROVIDER_SITE_OTHER)

## 2023-08-30 DIAGNOSIS — G459 Transient cerebral ischemic attack, unspecified: Secondary | ICD-10-CM

## 2023-09-01 ENCOUNTER — Ambulatory Visit: Payer: Self-pay | Admitting: Cardiovascular Disease

## 2023-09-03 ENCOUNTER — Telehealth: Payer: Self-pay | Admitting: Cardiovascular Disease

## 2023-09-03 ENCOUNTER — Ambulatory Visit: Payer: 59

## 2023-09-03 ENCOUNTER — Encounter

## 2023-09-03 NOTE — Telephone Encounter (Signed)
 Spoke with patient, see phone note from today (09/03/23) - no events recorded on ILR for yesterday

## 2023-09-03 NOTE — Telephone Encounter (Signed)
 Can we do a device check on this patient?

## 2023-09-03 NOTE — Telephone Encounter (Signed)
 Spoke with patient, states she felt some palpitations yesterday but device team states no events were recorded on monitor. Patient denies symptoms today but instructed when to seek medical attention. Scheduled patient a 1 year fu appt on 11/06/23 with Dr Arlester Ladd.

## 2023-09-03 NOTE — Telephone Encounter (Signed)
  Patient is requesting to speak with Dr. Katheryne Pane nurse. She would like to discuss with the nurse what she needs regarding her care.

## 2023-09-06 NOTE — Telephone Encounter (Signed)
 I spoke with the pt and she has a symptom activator and knows how to use it.

## 2023-09-17 NOTE — Progress Notes (Signed)
 [facility] Summary Report / Loop Recorder

## 2023-10-01 ENCOUNTER — Ambulatory Visit

## 2023-10-01 DIAGNOSIS — G459 Transient cerebral ischemic attack, unspecified: Secondary | ICD-10-CM

## 2023-10-01 LAB — CUP PACEART REMOTE DEVICE CHECK
Date Time Interrogation Session: 20250713231325
Implantable Pulse Generator Implant Date: 20210827

## 2023-10-03 ENCOUNTER — Ambulatory Visit: Payer: Self-pay | Admitting: Cardiovascular Disease

## 2023-10-08 ENCOUNTER — Ambulatory Visit: Payer: 59

## 2023-10-22 NOTE — Addendum Note (Signed)
 Addended by: VICCI SELLER A on: 10/22/2023 10:42 AM   Modules accepted: Orders

## 2023-10-22 NOTE — Progress Notes (Signed)
 [facility] Summary Report / Loop Recorder

## 2023-11-01 ENCOUNTER — Ambulatory Visit (INDEPENDENT_AMBULATORY_CARE_PROVIDER_SITE_OTHER)

## 2023-11-01 DIAGNOSIS — G459 Transient cerebral ischemic attack, unspecified: Secondary | ICD-10-CM

## 2023-11-01 LAB — CUP PACEART REMOTE DEVICE CHECK
Date Time Interrogation Session: 20250813230836
Implantable Pulse Generator Implant Date: 20210827

## 2023-11-05 NOTE — Progress Notes (Unsigned)
  Electrophysiology Office Note:    Date:  11/06/2023   ID:  [name], DOB [birth year], MRN [id number]  PCP:  Suanne Pfeiffer, NP   [facility] Providers Cardiologist:  Alvan Carrier, MD Electrophysiologist:  Eulas FORBES Furbish, MD     Referring MD: Suanne Pfeiffer, NP   History of Present Illness:    [name] is a 58 y.o. female with a medical history significant for TIA, arthritis, who presents for routine electrophysiology follow-up.     She has a Medtronic loop recorder in place due to palpitations.  She has had episodes of nonsustained atrial tachycardia and premature atrial contractions but no otherwise clinically significant arrhythmia.  She is very symptomatic with these episodes.  One occurred on October 13, 2022, captured on her loop recorder.  It correlated with an atrial tachycardia that ramped up to a rate of over 200 bpm but lasted no more than about 5 seconds.  She has not been able to tolerate rate slowing medications due to history of baseline low heart rates.  She reports feeling better overall since starting phentermine .      Today, she reports that she is doing reasonably well.  She reports that she feels fatigued constantly and is concerned that her heart rate is too low.  She recently had 1 symptom triggered event on her loop recorder that correlated with a single PAC.   EKGs/Labs/Other Studies Reviewed Today:    Echocardiogram:  TTE 11/14/2017 EF 60 to 65%; normal structure and function   Monitors:   Stress testing:  ETT 05/17/2021 No ischemia, no arrhythmia. 7 mets achieved  Advanced imaging:  Cardiac CT 2020   Cardiac catherization    EKG:   EKG Interpretation Date/Time:  Tuesday November 06 2023 09:36:24 EDT Ventricular Rate:  59 PR Interval:  114 QRS Duration:  84 QT Interval:  398 QTC Calculation: 394 R Axis:   68  Text Interpretation: Sinus bradycardia When compared with ECG of 27-Oct-2022 08:26, No  significant change was found Confirmed by Furbish Eulas [phone]) on 11/06/2023 9:41:30 AM     Physical Exam:    VS:  BP 110/68   Pulse (!) 59   Ht 5' 2 (1.575 m)   Wt 177 lb 9.6 oz (80.6 kg)   LMP 01/09/2011   SpO2 96%   BMI 32.48 kg/m     Wt Readings from Last 3 Encounters:  11/06/23 177 lb 9.6 oz (80.6 kg)  06/21/23 171 lb (77.6 kg)  12/21/22 175 lb 6.4 oz (79.6 kg)     GEN: Well nourished, well developed in no acute distress CARDIAC: RRR, no murmurs, rubs, gallops RESPIRATORY:  Normal work of breathing MUSCULOSKELETAL: no edema    ASSESSMENT & PLAN:    History of PACs, Non-sustained AT Episodes are very brief and rare However, she is very symptomatic She is reluctant to take any medications that may slow her rate I do not think the risk of antiarrhythmic medications would be worth the benefit Loop recorder showed isolated PACs associated with symptom events  Medtronic ILR in place Continue monitoring  Fatigue She is concerned that her heart rate is too low Recorder shows normal heart rate distribution centered around 70 to 75 bpm   History of TIA Monitoring with loop recorder in place     Signed, Eulas FORBES Furbish, MD  11/06/2023 9:41 AM    [facility]

## 2023-11-06 ENCOUNTER — Ambulatory Visit: Attending: Cardiovascular Disease | Admitting: Cardiovascular Disease

## 2023-11-06 ENCOUNTER — Encounter: Payer: Self-pay | Admitting: Cardiovascular Disease

## 2023-11-06 VITALS — BP 110/68 | HR 59 | Ht 62.0 in | Wt 177.6 lb

## 2023-11-06 DIAGNOSIS — R002 Palpitations: Secondary | ICD-10-CM

## 2023-11-06 NOTE — Patient Instructions (Signed)

## 2023-11-12 ENCOUNTER — Ambulatory Visit: Payer: 59

## 2023-12-03 ENCOUNTER — Ambulatory Visit (INDEPENDENT_AMBULATORY_CARE_PROVIDER_SITE_OTHER)

## 2023-12-03 DIAGNOSIS — G459 Transient cerebral ischemic attack, unspecified: Secondary | ICD-10-CM | POA: Diagnosis not present

## 2023-12-04 LAB — CUP PACEART REMOTE DEVICE CHECK
Date Time Interrogation Session: 20250914230825
Implantable Pulse Generator Implant Date: 20210827

## 2023-12-08 ENCOUNTER — Ambulatory Visit: Payer: Self-pay | Admitting: Cardiovascular Disease

## 2023-12-10 NOTE — Progress Notes (Signed)
 Remote Loop Recorder Transmission

## 2023-12-12 NOTE — Progress Notes (Signed)
 Office Visit Note  Patient: [name]             Date of Birth: 11-08-1965           MRN: [id number]             PCP: Suanne Pfeiffer, NP Referring: Suanne Pfeiffer, NP Visit Date: 12/25/2023 Occupation: Data Unavailable  Subjective:  Lower back pain   History of Present Illness: [name] is a 58 y.o. female with nodular episcleritis, osteoarthritis and degenerative disc disease.  She returns today after her last visit in April 2025.  She had right SI joint injection during that visit which gave her some relief.  She continues to have some right hip joint popping sensation.  The right knee replacement is doing well.  She had a nerve ablation for the lower back pain by Dr. Waylon which was very helpful.  She denies any episodes of nodular episcleritis.  She is followed by Dr. Charmayne on a regular basis.    Activities of Daily Living:  Patient reports morning stiffness for 10-15 minutes.   Patient Reports nocturnal pain.  Difficulty dressing/grooming: Denies Difficulty climbing stairs: Reports Difficulty getting out of chair: Reports Difficulty using hands for taps, buttons, cutlery, and/or writing: Reports  Review of Systems  Constitutional:  Positive for fatigue.  HENT:  Positive for mouth dryness. Negative for mouth sores.   Eyes:  Positive for dryness.  Respiratory:  Negative for shortness of breath.   Cardiovascular:  Positive for palpitations. Negative for chest pain.  Gastrointestinal:  Negative for blood in stool, constipation and diarrhea.  Endocrine: Negative for increased urination.  Genitourinary:  Negative for involuntary urination.  Musculoskeletal:  Positive for joint pain, gait problem, joint pain, joint swelling, myalgias, muscle weakness, morning stiffness, muscle tenderness and myalgias.  Skin:  Negative for color change, rash, hair loss and sensitivity to sunlight.  Allergic/Immunologic: Negative for susceptible to infections.  Neurological:   Positive for headaches. Negative for dizziness.  Hematological:  Negative for swollen glands.  Psychiatric/Behavioral:  Positive for depressed mood and sleep disturbance. The patient is nervous/anxious.     PMFS History:  Patient Active Problem List   Diagnosis Date Noted   S/P total knee arthroplasty, right 12/12/2022   Hyperlipemia 05/12/2021   Chest pain of uncertain etiology 05/12/2021   Palpitation 04/22/2019   Fatigue 04/22/2019   Menopause 04/22/2019   Vitamin D  deficiency disease 11/28/2018   Abdominal pain 03/21/2018   Nausea with vomiting 03/21/2018   Primary osteoarthritis of both knees 09/24/2017   Osteoarthritis of both knees 01/19/2016   Osteoarthritis of both feet 01/19/2016   History of TIA (transient ischemic attack) 01/19/2016   DDD (degenerative disc disease), lumbar 01/19/2016   Nodular episcleritis of both eyes 01/19/2016   Pain in right knee 01/19/2016   Leiomyoma 10/12/2011   Female pelvic pain 10/12/2011    Past Medical History:  Diagnosis Date   Anxiety    DDD (degenerative disc disease), lumbar 01/19/2016   Dysrhythmia    PACs, palpitations (Has Loop recorder)   Exposure to phentermine     uses currently   Family history of adverse reaction to anesthesia    son has N&V   History of TIA (transient ischemic attack) 01/19/2016   Hypothyroidism    Osteoarthritis of both feet 01/19/2016   Osteoarthritis of both knees 01/19/2016   PONV (postoperative nausea and vomiting)    Stroke (HCC) 07/2010   TIA per patient   Stroke (HCC) 2018  TIA   Vitamin D  deficiency disease 11/28/2018    Family History  Problem Relation Age of Onset   Anuerysm Mother    Heart attack Mother    Heart disease Mother    Diabetes Father    Hypertension Father    Macular degeneration Father    Diabetes Sister    Osteoarthritis Sister    Fibromyalgia Sister    Glaucoma Sister    Colon cancer Paternal Uncle    Past Surgical History:  Procedure Laterality Date    ABLATION  2025   back ablation   BIOPSY  05/15/2018   Procedure: BIOPSY;  Surgeon: Shaaron Lamar HERO, MD;  Location: [facility];  Service: Endoscopy;;  polyp cb   CHOLECYSTECTOMY     COLONOSCOPY N/A 05/15/2018   Procedure: COLONOSCOPY;  Surgeon: Shaaron Lamar HERO, MD;  Location: [facility];  Service: Endoscopy;  Laterality: N/A;  12:00pm   EYE SURGERY Bilateral    Lasik   implantable loop recorder placement  11/15/2019   Medtronic Reveal Linq model LNQ 22 ([place] [id number])  implantable loop recorder by Dr Kelsie   KNEE ARTHROSCOPY Right    x2   LAPAROSCOPIC ASSISTED VAGINAL HYSTERECTOMY  10/11/2011   Procedure: LAPAROSCOPIC ASSISTED VAGINAL HYSTERECTOMY;  Surgeon: Charlie HERO Croak, MD;  Location: [facility];  Service: Gynecology;  Laterality: N/A;   POLYPECTOMY  05/15/2018   Procedure: POLYPECTOMY;  Surgeon: Shaaron Lamar HERO, MD;  Location: [facility];  Service: Endoscopy;;  colon    TOTAL KNEE ARTHROPLASTY Right 12/12/2022   Procedure: TOTAL KNEE ARTHROPLASTY;  Surgeon: Ernie Cough, MD;  Location: [facility];  Service: Orthopedics;  Laterality: Right;   Social History   Tobacco Use   Smoking status: Never    Passive exposure: Past   Smokeless tobacco: Never  Vaping Use   Vaping status: Never Used  Substance Use Topics   Alcohol use: Yes    Comment: 1-2 times yearly   Drug use: No   Social History   Social History Narrative   Lives with husband of 2 years(second)  and son/family.Works in Psychologist, forensic for a long time. Has 49 son 26 years old. Divorced x 1(previously married for 25 yrs), remarried <1. Has 2 dogs.     Immunization History  Administered Date(s) Administered   Influenza Inj Mdck Quad Pf 01/26/2020   Influenza,inj,Quad PF,6+ Mos 02/27/2019   Influenza-Unspecified 02/27/2019   Tdap 08/24/2017     Objective: Vital Signs: BP 127/84   Pulse (!) 59   Temp (!) 97.4 F (36.3 C)   Resp 14   Ht 5' 2 (1.575 m)   Wt 177 lb 12.8 oz (80.6 kg)   LMP 01/09/2011    BMI 32.52 kg/m    Physical Exam Vitals and nursing note reviewed.  Constitutional:      Appearance: She is well-developed.  HENT:     Head: Normocephalic and atraumatic.  Eyes:     Conjunctiva/sclera: Conjunctivae normal.  Cardiovascular:     Rate and Rhythm: Normal rate and regular rhythm.     Heart sounds: Normal heart sounds.  Pulmonary:     Effort: Pulmonary effort is normal.     Breath sounds: Normal breath sounds.  Abdominal:     General: Bowel sounds are normal.     Palpations: Abdomen is soft.  Musculoskeletal:     Cervical back: Normal range of motion.  Lymphadenopathy:     Cervical: No cervical adenopathy.  Skin:    General: Skin is  warm and dry.     Capillary Refill: Capillary refill takes less than 2 seconds.  Neurological:     Mental Status: She is alert and oriented to person, place, and time.  Psychiatric:        Behavior: Behavior normal.      Musculoskeletal Exam: Cervical, thoracic and lumbar spine were in good range of motion.  There was no SI joint tenderness.  She has some tenderness over her right piriformis region.  Shoulder joints, elbow joints, wrist joints, MCPs, PIPs and DIPs were in good range of motion with no synovitis.  Hip joints and knee joints were in good range of motion without any warmth swelling or effusion.  Right knee with joint was replaced.  There was no tenderness over ankles or MTPs.  She had bilateral bunions.   CDAI Exam: CDAI Score: -- Patient Global: --; Provider Global: -- Swollen: --; Tender: -- Joint Exam 12/25/2023   No joint exam has been documented for this visit   There is currently no information documented on the homunculus. Go to the Rheumatology activity and complete the homunculus joint exam.  Investigation: No additional findings.  Imaging: CUP PACEART REMOTE DEVICE CHECK Result Date: 12/04/2023 ILR summary report received. Battery status OK. Normal device function. No new symptom, tachy, brady, or  pause episodes. No new AF episodes. Monthly summary reports and ROV/PRN AB, CVRS   Recent Labs: Lab Results  Component Value Date   WBC 15.7 (H) 12/13/2022   HGB 10.2 (L) 12/13/2022   PLT 272 12/13/2022   NA 136 12/13/2022   K 3.8 12/13/2022   CL 104 12/13/2022   CO2 22 12/13/2022   GLUCOSE 140 (H) 12/13/2022   BUN 17 12/13/2022   CREATININE 0.39 (L) 12/13/2022   BILITOT 1.0 01/26/2020   ALKPHOS 50 07/13/2016   AST 11 01/26/2020   ALT 10 01/26/2020   PROT 7.0 01/26/2020   ALBUMIN 3.8 07/13/2016   CALCIUM 8.3 (L) 12/13/2022   GFRAA 124 01/26/2020    Speciality Comments: No specialty comments available.  Procedures:  No procedures performed Allergies: Celebrex [celecoxib]   Assessment / Plan:     Visit Diagnoses: Nodular episcleritis of both eyes-patient denies any recurrence of eye symptoms.  She had been seeing Dr. Charmayne on a regular basis.  Primary osteoarthritis of both hands -no significant PIP or DIP thickening was noted.  She has some stiffness in her hands.  No synovitis was noted.  05/12/20: CRP WNL, ESR WNL, RF-, anti-CCP-, 14-3-3 eta negative. X-rays updated 04/2320 consistent with early osteoarthritic changes.  Primary osteoarthritis of left knee-she denies any discomfort.  No warmth swelling or effusion was noted.  S/P total knee arthroplasty, right -doing well.  She had right total knee replacement performed by Dr. Ernie on 12/12/22.   Primary osteoarthritis of both feet-she has bilateral bunions.  Proper fitting shoes with wide toebox was discussed.  Degeneration of intervertebral disc of lumbar region without discogenic back pain or lower extremity pain-she had good response to her recent nerve ablation.  Core strength exercises were discussed.  Chronic SI joint pain - Dr. Waylon.  Patient had bilateral SI joint cortisone injections performed in December 2024 by Dr. Waylon.  She had right SI joint injection in April 2025.  Stretching exercises were  demonstrated and discussed.  History of cholecystectomy  History of TIA (transient ischemic attack)  History of recent fall  Orders: No orders of the defined types were placed in this encounter.  No orders  of the defined types were placed in this encounter.    Follow-Up Instructions: Return in about 6 months (around 06/24/2024) for Osteoarthritis.   Maya Nash, MD  Note - This record has been created using Animal nutritionist.  Chart creation errors have been sought, but may not always  have been located. Such creation errors do not reflect on  the standard of medical care.

## 2023-12-13 NOTE — Progress Notes (Signed)
 Remote Loop Recorder Transmission

## 2023-12-17 ENCOUNTER — Ambulatory Visit: Payer: 59

## 2023-12-25 ENCOUNTER — Ambulatory Visit: Attending: Rheumatology | Admitting: Rheumatology

## 2023-12-25 ENCOUNTER — Encounter: Payer: Self-pay | Admitting: Rheumatology

## 2023-12-25 VITALS — BP 127/84 | HR 59 | Temp 97.4°F | Resp 14 | Ht 62.0 in | Wt 177.8 lb

## 2023-12-25 DIAGNOSIS — M19041 Primary osteoarthritis, right hand: Secondary | ICD-10-CM

## 2023-12-25 DIAGNOSIS — Z96651 Presence of right artificial knee joint: Secondary | ICD-10-CM

## 2023-12-25 DIAGNOSIS — M1712 Unilateral primary osteoarthritis, left knee: Secondary | ICD-10-CM

## 2023-12-25 DIAGNOSIS — M19042 Primary osteoarthritis, left hand: Secondary | ICD-10-CM

## 2023-12-25 DIAGNOSIS — M51369 Other intervertebral disc degeneration, lumbar region without mention of lumbar back pain or lower extremity pain: Secondary | ICD-10-CM

## 2023-12-25 DIAGNOSIS — Z9049 Acquired absence of other specified parts of digestive tract: Secondary | ICD-10-CM

## 2023-12-25 DIAGNOSIS — Z9181 History of falling: Secondary | ICD-10-CM

## 2023-12-25 DIAGNOSIS — M19072 Primary osteoarthritis, left ankle and foot: Secondary | ICD-10-CM

## 2023-12-25 DIAGNOSIS — G8929 Other chronic pain: Secondary | ICD-10-CM

## 2023-12-25 DIAGNOSIS — H15123 Nodular episcleritis, bilateral: Secondary | ICD-10-CM | POA: Diagnosis not present

## 2023-12-25 DIAGNOSIS — M19071 Primary osteoarthritis, right ankle and foot: Secondary | ICD-10-CM

## 2023-12-25 DIAGNOSIS — M533 Sacrococcygeal disorders, not elsewhere classified: Secondary | ICD-10-CM

## 2023-12-25 DIAGNOSIS — Z8673 Personal history of transient ischemic attack (TIA), and cerebral infarction without residual deficits: Secondary | ICD-10-CM

## 2023-12-26 NOTE — Progress Notes (Signed)
 Remote Loop Recorder Transmission

## 2024-01-03 ENCOUNTER — Ambulatory Visit

## 2024-01-03 DIAGNOSIS — G459 Transient cerebral ischemic attack, unspecified: Secondary | ICD-10-CM | POA: Diagnosis not present

## 2024-01-03 LAB — CUP PACEART REMOTE DEVICE CHECK
Date Time Interrogation Session: 20251015230125
Implantable Pulse Generator Implant Date: 20210827

## 2024-01-09 NOTE — Progress Notes (Signed)
 Remote Loop Recorder Transmission

## 2024-01-14 ENCOUNTER — Ambulatory Visit: Payer: Self-pay | Admitting: Cardiovascular Disease

## 2024-01-21 ENCOUNTER — Ambulatory Visit: Payer: 59

## 2024-02-03 ENCOUNTER — Ambulatory Visit

## 2024-02-04 ENCOUNTER — Ambulatory Visit

## 2024-02-05 ENCOUNTER — Ambulatory Visit: Attending: Cardiovascular Disease

## 2024-02-05 DIAGNOSIS — G459 Transient cerebral ischemic attack, unspecified: Secondary | ICD-10-CM

## 2024-02-06 LAB — CUP PACEART REMOTE DEVICE CHECK
Date Time Interrogation Session: 20251117230444
Implantable Pulse Generator Implant Date: 20210827

## 2024-02-08 NOTE — Progress Notes (Signed)
 Remote Loop Recorder Transmission

## 2024-02-18 ENCOUNTER — Ambulatory Visit: Payer: Self-pay | Admitting: Cardiovascular Disease

## 2024-03-05 ENCOUNTER — Ambulatory Visit

## 2024-03-07 ENCOUNTER — Ambulatory Visit

## 2024-03-07 DIAGNOSIS — G459 Transient cerebral ischemic attack, unspecified: Secondary | ICD-10-CM

## 2024-03-07 LAB — CUP PACEART REMOTE DEVICE CHECK
Date Time Interrogation Session: 20251218230621
Implantable Pulse Generator Implant Date: 20210827

## 2024-03-10 NOTE — Progress Notes (Signed)
 Remote Loop Recorder Transmission

## 2024-03-19 ENCOUNTER — Ambulatory Visit: Payer: Self-pay | Admitting: Cardiovascular Disease

## 2024-04-05 ENCOUNTER — Ambulatory Visit

## 2024-04-07 ENCOUNTER — Ambulatory Visit

## 2024-04-07 DIAGNOSIS — G459 Transient cerebral ischemic attack, unspecified: Secondary | ICD-10-CM | POA: Diagnosis not present

## 2024-04-08 LAB — CUP PACEART REMOTE DEVICE CHECK
Date Time Interrogation Session: 20260118230247
Implantable Pulse Generator Implant Date: 20210827

## 2024-04-11 NOTE — Progress Notes (Signed)
 Remote Loop Recorder Transmission

## 2024-04-15 ENCOUNTER — Ambulatory Visit: Payer: Self-pay | Admitting: Cardiovascular Disease

## 2024-05-06 ENCOUNTER — Ambulatory Visit

## 2024-05-08 ENCOUNTER — Ambulatory Visit

## 2024-06-06 ENCOUNTER — Ambulatory Visit

## 2024-06-08 ENCOUNTER — Ambulatory Visit

## 2024-06-25 ENCOUNTER — Ambulatory Visit: Admitting: Rheumatology

## 2024-07-09 ENCOUNTER — Ambulatory Visit

## 2024-08-09 ENCOUNTER — Ambulatory Visit

## 2024-09-09 ENCOUNTER — Ambulatory Visit

## 2024-10-10 ENCOUNTER — Ambulatory Visit

## 2024-11-10 ENCOUNTER — Ambulatory Visit

## 2024-12-11 ENCOUNTER — Ambulatory Visit

## 2025-01-11 ENCOUNTER — Ambulatory Visit
